# Patient Record
Sex: Female | Born: 1955 | Race: White | Hispanic: No | Marital: Married | Smoking: Former smoker
Health system: Southern US, Community
[De-identification: ages and names within clinical notes are randomized; demographics above are authoritative.]

## PROBLEM LIST (undated history)

## (undated) DIAGNOSIS — J209 Acute bronchitis, unspecified: Secondary | ICD-10-CM

## (undated) DIAGNOSIS — M359 Systemic involvement of connective tissue, unspecified: Secondary | ICD-10-CM

## (undated) DIAGNOSIS — K219 Gastro-esophageal reflux disease without esophagitis: Secondary | ICD-10-CM

## (undated) DIAGNOSIS — C801 Malignant (primary) neoplasm, unspecified: Secondary | ICD-10-CM

## (undated) DIAGNOSIS — J449 Chronic obstructive pulmonary disease, unspecified: Secondary | ICD-10-CM

## (undated) DIAGNOSIS — M797 Fibromyalgia: Secondary | ICD-10-CM

## (undated) DIAGNOSIS — D649 Anemia, unspecified: Secondary | ICD-10-CM

## (undated) DIAGNOSIS — I10 Essential (primary) hypertension: Secondary | ICD-10-CM

## (undated) DIAGNOSIS — R011 Cardiac murmur, unspecified: Secondary | ICD-10-CM

## (undated) DIAGNOSIS — I350 Nonrheumatic aortic (valve) stenosis: Secondary | ICD-10-CM

## (undated) DIAGNOSIS — R079 Chest pain, unspecified: Secondary | ICD-10-CM

## (undated) DIAGNOSIS — I6529 Occlusion and stenosis of unspecified carotid artery: Secondary | ICD-10-CM

## (undated) DIAGNOSIS — Z720 Tobacco use: Secondary | ICD-10-CM

## (undated) HISTORY — DX: Nonrheumatic aortic (valve) stenosis: I35.0

## (undated) HISTORY — DX: Chest pain, unspecified: R07.9

## (undated) HISTORY — PX: ABDOMINAL HYSTERECTOMY: SHX81

## (undated) HISTORY — PX: COLONOSCOPY: SHX174

## (undated) HISTORY — DX: Cardiac murmur, unspecified: R01.1

## (undated) HISTORY — DX: Fibromyalgia: M79.7

## (undated) HISTORY — PX: TONSILLECTOMY: SUR1361

## (undated) HISTORY — DX: Acute bronchitis, unspecified: J20.9

## (undated) HISTORY — DX: Occlusion and stenosis of unspecified carotid artery: I65.29

## (undated) HISTORY — DX: Tobacco use: Z72.0

## (undated) HISTORY — DX: Anemia, unspecified: D64.9

## (undated) HISTORY — PX: UPPER GI ENDOSCOPY: SHX6162

---

## 2011-11-12 DIAGNOSIS — I6529 Occlusion and stenosis of unspecified carotid artery: Secondary | ICD-10-CM

## 2011-11-12 DIAGNOSIS — Z952 Presence of prosthetic heart valve: Secondary | ICD-10-CM | POA: Insufficient documentation

## 2011-11-12 HISTORY — PX: AORTIC VALVE REPLACEMENT: SHX41

## 2011-11-12 HISTORY — DX: Occlusion and stenosis of unspecified carotid artery: I65.29

## 2012-06-24 ENCOUNTER — Ambulatory Visit: Payer: Self-pay | Admitting: Cardiology

## 2012-07-08 ENCOUNTER — Encounter: Payer: Self-pay | Admitting: Cardiology

## 2012-07-27 ENCOUNTER — Ambulatory Visit (INDEPENDENT_AMBULATORY_CARE_PROVIDER_SITE_OTHER): Payer: BC Managed Care – PPO | Admitting: Cardiovascular Disease

## 2012-07-27 ENCOUNTER — Encounter: Payer: Self-pay | Admitting: Cardiovascular Disease

## 2012-07-27 VITALS — BP 141/84 | HR 80 | Ht 63.0 in | Wt 116.0 lb

## 2012-07-27 DIAGNOSIS — Z954 Presence of other heart-valve replacement: Secondary | ICD-10-CM

## 2012-07-27 DIAGNOSIS — F17200 Nicotine dependence, unspecified, uncomplicated: Secondary | ICD-10-CM

## 2012-07-27 DIAGNOSIS — Z720 Tobacco use: Secondary | ICD-10-CM | POA: Insufficient documentation

## 2012-07-27 DIAGNOSIS — Z136 Encounter for screening for cardiovascular disorders: Secondary | ICD-10-CM

## 2012-07-27 DIAGNOSIS — Z952 Presence of prosthetic heart valve: Secondary | ICD-10-CM | POA: Insufficient documentation

## 2012-07-27 DIAGNOSIS — I6529 Occlusion and stenosis of unspecified carotid artery: Secondary | ICD-10-CM | POA: Insufficient documentation

## 2012-07-27 NOTE — Patient Instructions (Addendum)
Follow up in 6 months 

## 2012-07-27 NOTE — Assessment & Plan Note (Signed)
This was mild and bilateral. Continue small dose aspirin daily. I discussed with her the importance of smoking cessation. Recommend a followup carotid duplex ultrasound in 2 years.

## 2012-07-27 NOTE — Assessment & Plan Note (Signed)
I had a prolonged discussion with her about the importance of smoking cessation. She is down to one or 2 cigarettes a day.

## 2012-07-27 NOTE — Assessment & Plan Note (Signed)
She is doing well after an aortic valve replacement with a mechanical valve more than 6 months ago in [place]. Continue anticoagulation with warfarin with a target INR between 2 and 3. I recommend a followup echocardiogram in 6 months from now to have a baseline here. She had no significant coronary artery disease on preoperative cardiac cath.

## 2012-07-27 NOTE — Progress Notes (Signed)
HPI  This is a pleasant 57 year old female who is here today to establish cardiovascular care. She moved recently from [place]. She presented there in May 2013 with unstable angina. Cardiac evaluation reveals critical aortic stenosis with a valve area of 0.42. Cardiac catheterization showed no significant coronary artery disease. Carotid ultrasound showed mild disease bilaterally. She underwent aortic valve replacement with a St. Jude mechanical valve without complications. Postoperative echocardiogram showed normal functioning mechanical valve with a mean/peak gradient of 7/14 mmHg. She has been doing well. She denies any significant dyspnea, orthopnea or PND. She has some chest discomfort at the surgical site after she carried heavy objects during the recent move. She continues to smoke one to 2 packs per day. She gets her warfarin managed at [facility]. She had a recent upper respiratory tract infection which improved.  Allergies  Allergen Reactions  . Codeine     Nausea      Current Outpatient Prescriptions on File Prior to Visit  Medication Sig Dispense Refill  . aspirin 81 MG tablet Take 81 mg by mouth daily.      . ferrous sulfate 325 (65 FE) MG tablet Take 325 mg by mouth daily with breakfast.      . pantoprazole (PROTONIX) 40 MG tablet Take 40 mg by mouth daily.      Marland Kitchen warfarin (COUMADIN) 2.5 MG tablet Take 2.5 mg by mouth daily.         Past Medical History  Diagnosis Date  . Anemia, unspecified   . Acute bronchitis   . Chest pain   . Murmur   . Fibromyalgia   . Tobacco abuse   . Aortic stenosis     previous cardiologisit: Dr. Titus Dubin in [place], [place]  . Carotid stenosis 05/13    mild less than 50% bilaterally     Past Surgical History  Procedure Date  . Aortic valve replacement 11/2011    in [place]. St. Jude 19 mm     Family History  Problem Relation Age of Onset  . Cancer Mother     Died at 75  . Diabetes type II Sister   . Emphysema Father   .  Heart attack Mother   . Heart disease Sister   . Liver disease Father   . Rheumatic fever Sister      History   Social History  . Marital Status: Single    Spouse Name: N/A    Number of Children: N/A  . Years of Education: N/A   Occupational History  . Not on file.   Social History Main Topics  . Smoking status: Current Every Day Smoker -- 0.9 packs/day for 42 years    Types: Cigarettes  . Smokeless tobacco: Not on file  . Alcohol Use: No  . Drug Use: No  . Sexually Active: Not on file   Other Topics Concern  . Not on file   Social History Narrative  . No narrative on file     ROS Constitutional: Negative for fever, chills, diaphoresis, activity change, appetite change and fatigue.  HENT: Negative for hearing loss, nosebleeds, congestion, sore throat, facial swelling, drooling, trouble swallowing, neck pain, voice change, sinus pressure and tinnitus.  Eyes: Negative for photophobia, pain, discharge and visual disturbance.  Respiratory: Negative for apnea, cough, chest tightness, shortness of breath and wheezing.  Cardiovascular: Negative for chest pain, palpitations and leg swelling.  Gastrointestinal: Negative for nausea, vomiting, abdominal pain, diarrhea, constipation, blood in stool and abdominal distention.  Genitourinary: Negative  for dysuria, urgency, frequency, hematuria and decreased urine volume.  Musculoskeletal: Negative for myalgias, back pain, joint swelling, arthralgias and gait problem.  Skin: Negative for color change, pallor, rash and wound.  Neurological: Negative for dizziness, tremors, seizures, syncope, speech difficulty, weakness, light-headedness, numbness and headaches.  Psychiatric/Behavioral: Negative for suicidal ideas, hallucinations, behavioral problems and agitation. The patient is not nervous/anxious.     PHYSICAL EXAM   BP 141/84  Pulse 80  Ht 5\' 3"  (1.6 m)  Wt 116 lb (52.617 kg)  BMI 20.55 kg/m2 Constitutional: She is oriented  to person, place, and time. She appears well-developed and well-nourished. No distress.  HENT: No nasal discharge.  Head: Normocephalic and atraumatic.  Eyes: Pupils are equal and round. Right eye exhibits no discharge. Left eye exhibits no discharge.  Neck: Normal range of motion. Neck supple. No JVD present. No thyromegaly present. No carotid bruit Cardiovascular: Normal rate, regular rhythm, normal mechanical heart sounds. Exam reveals no gallop and no friction rub. No murmur heard.  Pulmonary/Chest: Effort normal and breath sounds normal. No stridor. No respiratory distress. She has no wheezes. She has no rales. She exhibits no tenderness.  Abdominal: Soft. Bowel sounds are normal. She exhibits no distension. There is no tenderness. There is no rebound and no guarding.  Musculoskeletal: Normal range of motion. She exhibits no edema and no tenderness.  Neurological: She is alert and oriented to person, place, and time. Coordination normal.  Skin: Skin is warm and dry. No rash noted. She is not diaphoretic. No erythema. No pallor.  Psychiatric: She has a normal mood and affect. Her behavior is normal. Judgment and thought content normal.     EKG: Sinus  Rhythm  WITHIN NORMAL LIMITS   ASSESSMENT AND PLAN

## 2012-12-01 ENCOUNTER — Ambulatory Visit (INDEPENDENT_AMBULATORY_CARE_PROVIDER_SITE_OTHER): Payer: BC Managed Care – PPO | Admitting: Cardiology

## 2012-12-01 DIAGNOSIS — I359 Nonrheumatic aortic valve disorder, unspecified: Secondary | ICD-10-CM

## 2012-12-09 ENCOUNTER — Encounter: Payer: Self-pay | Admitting: *Deleted

## 2012-12-21 ENCOUNTER — Ambulatory Visit (INDEPENDENT_AMBULATORY_CARE_PROVIDER_SITE_OTHER): Payer: BC Managed Care – PPO | Admitting: *Deleted

## 2012-12-21 DIAGNOSIS — Z952 Presence of prosthetic heart valve: Secondary | ICD-10-CM

## 2012-12-21 DIAGNOSIS — Z7901 Long term (current) use of anticoagulants: Secondary | ICD-10-CM | POA: Insufficient documentation

## 2012-12-21 DIAGNOSIS — Z954 Presence of other heart-valve replacement: Secondary | ICD-10-CM

## 2012-12-21 NOTE — Patient Instructions (Addendum)

## 2013-01-25 ENCOUNTER — Encounter: Payer: Self-pay | Admitting: Cardiovascular Disease

## 2013-01-25 ENCOUNTER — Ambulatory Visit (INDEPENDENT_AMBULATORY_CARE_PROVIDER_SITE_OTHER): Payer: BC Managed Care – PPO | Admitting: Cardiovascular Disease

## 2013-01-25 VITALS — BP 151/80 | HR 101 | Ht 63.0 in | Wt 122.0 lb

## 2013-01-25 DIAGNOSIS — Z952 Presence of prosthetic heart valve: Secondary | ICD-10-CM

## 2013-01-25 DIAGNOSIS — Z720 Tobacco use: Secondary | ICD-10-CM

## 2013-01-25 DIAGNOSIS — Z954 Presence of other heart-valve replacement: Secondary | ICD-10-CM

## 2013-01-25 DIAGNOSIS — I10 Essential (primary) hypertension: Secondary | ICD-10-CM

## 2013-01-25 DIAGNOSIS — F17200 Nicotine dependence, unspecified, uncomplicated: Secondary | ICD-10-CM

## 2013-01-25 DIAGNOSIS — I6529 Occlusion and stenosis of unspecified carotid artery: Secondary | ICD-10-CM

## 2013-01-25 NOTE — Progress Notes (Signed)
Patient ID: Judy Sawyer, female   DOB: 1956/02/04, 57 y.o.   MRN: [id number]    SUBJECTIVE: This is a pleasant 57 year old who is here for a routine f/u visit. She moved here from [place] in November 2013. She presented there in May 2013 with unstable angina. Cardiac evaluation revealed critical aortic stenosis with a valve area of 0.42. Cardiac catheterization showed no significant coronary artery disease. Carotid ultrasound showed mild disease bilaterally. She underwent aortic valve replacement with a St. Jude mechanical valve without complications. Postoperative echocardiogram showed normal functioning mechanical valve with a mean/peak gradient of 7/14 mmHg.  Since that time, she has been doing well. She denies any significant chest pain, dyspnea, orthopnea or PND. She continues to smoke one to 2-3 cigarettes per day, which is less than what she used to smoke. She gets her warfarin managed at [facility].  She and her husband did a 1 mile prayer walk last night. She had some toe and calf cramping, but to a mild degree. When she recently walked 3 miles, she had no cramping.    BP: 151/80 mmHg HR: 101 bpm (96 bpm on recheck)  PHYSICAL EXAM General: NAD Neck: No JVD, no thyromegaly or thyroid nodule.  Lungs: Clear to auscultation bilaterally with normal respiratory effort. CV: Nondisplaced PMI.  Heart regular S1/prosthetic S2, no S3/S4, no murmur.  No peripheral edema.  No carotid bruit.  Normal pedal pulses.  Abdomen: Soft, nontender, no hepatosplenomegaly, no distention.  Neurologic: Alert and oriented x 3.  Psych: Normal affect. Extremities: No clubbing or cyanosis.     LABS: Basic Metabolic Panel: No results found for this basename: NA, K, CL, CO2, GLUCOSE, BUN, CREATININE, CALCIUM, MG, PHOS,  in the last 72 hours Liver Function Tests: No results found for this basename: AST, ALT, ALKPHOS, BILITOT, PROT, ALBUMIN,  in the last 72 hours No results found for this basename: LIPASE,  AMYLASE,  in the last 72 hours CBC: No results found for this basename: WBC, NEUTROABS, HGB, HCT, MCV, PLT,  in the last 72 hours Cardiac Enzymes: No results found for this basename: CKTOTAL, CKMB, CKMBINDEX, TROPONINI,  in the last 72 hours BNP: No components found with this basename: POCBNP,  D-Dimer: No results found for this basename: DDIMER,  in the last 72 hours Hemoglobin A1C: No results found for this basename: HGBA1C,  in the last 72 hours Fasting Lipid Panel: No results found for this basename: CHOL, HDL, LDLCALC, TRIG, CHOLHDL, LDLDIRECT,  in the last 72 hours Thyroid Function Tests: No results found for this basename: TSH, T4TOTAL, FREET3, T3FREE, THYROIDAB,  in the last 72 hours Anemia Panel: No results found for this basename: VITAMINB12, FOLATE, FERRITIN, TIBC, IRON, RETICCTPCT,  in the last 72 hours   Echo (May 2014): - Left ventricle: The cavity size was normal. Wall thickness was normal. Systolic function was normal. The estimated ejection fraction was in the range of 60% to 65%. Wall motion was normal; there were no regional wall motion abnormalities. Doppler parameters are consistent with abnormal left ventricular relaxation (grade 1 diastolic dysfunction). - Aortic valve: A St. Jude Medical mechanical prosthesis was present. Appears well seated. Trivial regurgitation. Somewhat peripherally located, cannot completely exclude paravalvular without old study for comparison. Mean gradient: 7mm Hg (S). Peak gradient: 12mm Hg (S). - Mitral valve: Mild regurgitation. - Right atrium: Central venous pressure: 3mm Hg (est). - Tricuspid valve: Mild regurgitation. - Pulmonary arteries: PA peak pressure: 24mm Hg (S). - Pericardium, extracardiac: There was no pericardial effusion.  ASSESSMENT AND PLAN: 1. S/p AVR: She is doing well after an aortic valve replacement with a mechanical valve in [place]. Continue anticoagulation with warfarin with a target INR between 2  and 3.  She had no significant coronary artery disease on preoperative cardiac cath. 2. Carotid artery stenosis: This was mild and bilateral. Continue small dose aspirin daily. I discussed with her the importance of smoking cessation.  3. Tobacco abuse: cessation counseling given. She has cut down considerably. 4. HTN: usually 127/70 mmHg. Will monitor for now. She has a cuff at home, so I've instructed her to check it 3-4 times a week for the next 2 weeks, and to inform me of those results.   Prentice Docker, M.D., F.A.C.C.

## 2013-01-25 NOTE — Patient Instructions (Addendum)
Continue all current medications. Your physician has requested that you regularly monitor and record your blood pressure readings at home. Please use the same machine at the same time of day to check your readings and record them to bring to your follow-up visit. Check at home 3-4 x week x 2 weeks.  Please bring to office or mail.   Your physician wants you to follow up in: 6 months.  You will receive a reminder letter in the mail one-two months in advance.  If you don't receive a letter, please call our office to schedule the follow up appointment    Blood Pressure Record Sheet Your blood pressure on this visit to the Emergency Department or Clinic is elevated. This does not necessarily mean you have hypertension, but it does mean that your blood pressure needs to be rechecked. Many times blood pressure increases because of illness, pain, anxiety, or other factors. We recommend that you get a series of blood pressure readings done over a period of about 5 days. It is best to get a reading in the morning and one in the evening. You should make sure you sit and relax for 1 to 5 minutes before the reading is taken. Write the readings down and make a follow-up appointment with your doctor to discuss the results. If there is not a free clinic or a drug store with blood pressure taking machines near you, you can purchase good blood pressure taking equipment from a drug store, often for less than the price of a physician's office call. Having one in the home also allows you the convenience of taking your blood pressure while you are home and in relaxed surroundings. Your blood pressure in the Emergency Department or Clinic on ________ was ____________________. BLOOD PRESSURE LOG Date: _______________________  a.m. _____________________  p.m. _____________________ Date: _______________________  a.m. _____________________  p.m. _____________________ Date: _______________________  a.m.  _____________________  p.m. _____________________ Date: _______________________  a.m. _____________________  p.m. _____________________ Date: _______________________  a.m. _____________________  p.m. _____________________ Date: _______________________  a.m. _____________________  p.m. _____________________ Date: _______________________  a.m. _____________________  p.m. _____________________ Date: _______________________  a.m. _____________________  p.m. _____________________ Date: _______________________  a.m. _____________________  p.m. _____________________ Date: _______________________  a.m. _____________________  p.m. _____________________ Date: _______________________  a.m. _____________________  p.m. _____________________ Date: _______________________  a.m. _____________________  p.m. _____________________ Date: _______________________  a.m. _____________________  p.m. _____________________ Date: _______________________  a.m. _____________________  p.m. _____________________ Date: _______________________  a.m. _____________________  p.m. _____________________

## 2013-02-01 ENCOUNTER — Ambulatory Visit: Payer: Self-pay | Admitting: Pharmacist

## 2013-02-01 DIAGNOSIS — Z952 Presence of prosthetic heart valve: Secondary | ICD-10-CM

## 2013-02-01 DIAGNOSIS — Z7901 Long term (current) use of anticoagulants: Secondary | ICD-10-CM

## 2013-02-21 ENCOUNTER — Telehealth: Payer: Self-pay | Admitting: *Deleted

## 2013-02-21 NOTE — Telephone Encounter (Signed)
Patient brought BP log by office:               01/27/13 11:00 am 130/75   10:00 pm 120/76  01/29/13 2:30 pm 125/76  01/31/13 9:00 am 128/80  02/02/13 1:30 pm 123/76  02/03/13 3:20 pm  137/85   7:30 pm  138/80  02/04/13 11:00 am 126/80  02/09/13 11:15 am  128/80  02/10/13 3:00 pm  132/83  02/14/13 11:00 am  130/80

## 2013-02-22 NOTE — Telephone Encounter (Signed)
Patient notified and verbalized understanding. 

## 2013-02-22 NOTE — Telephone Encounter (Signed)
Excellent control. No changes in meds.

## 2013-07-18 ENCOUNTER — Telehealth: Payer: Self-pay | Admitting: Cardiovascular Disease

## 2013-07-18 NOTE — Telephone Encounter (Signed)
Patient has 6 mo follow up scheduled for 08/01/2013 with Dr. Bronson Ing.  Reviewed most recent dictation & echo and notes do not mention when echo should be repeated again.  Please advise.

## 2013-07-18 NOTE — Telephone Encounter (Signed)
Patient will be seeing Dr Bronson Ing this month  She would like to know if she needs an Echo done before visit.  She stated that she normally does one every 6 months

## 2013-07-18 NOTE — Telephone Encounter (Signed)
Can wait to see me at ov and I will determine if a repeat echo is needed at that time.

## 2013-07-19 NOTE — Telephone Encounter (Signed)
Patient notified

## 2013-08-01 ENCOUNTER — Ambulatory Visit (INDEPENDENT_AMBULATORY_CARE_PROVIDER_SITE_OTHER): Payer: BC Managed Care – PPO | Admitting: Cardiovascular Disease

## 2013-08-01 ENCOUNTER — Encounter (INDEPENDENT_AMBULATORY_CARE_PROVIDER_SITE_OTHER): Payer: Self-pay

## 2013-08-01 ENCOUNTER — Encounter: Payer: Self-pay | Admitting: Cardiovascular Disease

## 2013-08-01 VITALS — BP 146/85 | HR 91 | Ht 63.0 in | Wt 120.8 lb

## 2013-08-01 DIAGNOSIS — Z7901 Long term (current) use of anticoagulants: Secondary | ICD-10-CM

## 2013-08-01 DIAGNOSIS — F17200 Nicotine dependence, unspecified, uncomplicated: Secondary | ICD-10-CM

## 2013-08-01 DIAGNOSIS — R252 Cramp and spasm: Secondary | ICD-10-CM

## 2013-08-01 DIAGNOSIS — M79609 Pain in unspecified limb: Secondary | ICD-10-CM

## 2013-08-01 DIAGNOSIS — Z952 Presence of prosthetic heart valve: Secondary | ICD-10-CM

## 2013-08-01 DIAGNOSIS — I10 Essential (primary) hypertension: Secondary | ICD-10-CM

## 2013-08-01 DIAGNOSIS — G8912 Acute post-thoracotomy pain: Secondary | ICD-10-CM

## 2013-08-01 DIAGNOSIS — R0789 Other chest pain: Secondary | ICD-10-CM

## 2013-08-01 DIAGNOSIS — G8918 Other acute postprocedural pain: Secondary | ICD-10-CM

## 2013-08-01 DIAGNOSIS — Z954 Presence of other heart-valve replacement: Secondary | ICD-10-CM

## 2013-08-01 DIAGNOSIS — I6529 Occlusion and stenosis of unspecified carotid artery: Secondary | ICD-10-CM

## 2013-08-01 DIAGNOSIS — Z720 Tobacco use: Secondary | ICD-10-CM

## 2013-08-01 DIAGNOSIS — Z136 Encounter for screening for cardiovascular disorders: Secondary | ICD-10-CM

## 2013-08-01 DIAGNOSIS — E876 Hypokalemia: Secondary | ICD-10-CM

## 2013-08-01 MED ORDER — POTASSIUM CHLORIDE CRYS ER 20 MEQ PO TBCR: 20.0000 meq | EXTENDED_RELEASE_TABLET | Freq: Every day | ORAL | Status: DC

## 2013-08-01 NOTE — Patient Instructions (Signed)
   Lab for BMET, Magnesium - do today Office will contact with results via phone or letter.   Begin KCl 57meq (potassium) daily - please start after receiving lab results Continue all other medications.   Establish with our coumadin clinic Follow up in  4-6 weeks

## 2013-08-01 NOTE — Progress Notes (Signed)
Patient ID: Judy Sawyer, female   DOB: [birth year], 58 y.o.   MRN: [id number]      SUBJECTIVE: The patient is here to followup for routine cardiovascular care after undergoing mechanical aortic valve replacement, hypertension, and mild carotid artery disease. She has been having chest pains underneath the left and right breast as well as in the substernal region. She describes it as a muscle ache. It is exacerbated after chopping wood. She's also been experiencing hand leg and foot cramping. She took some mustard and some molasses and this did help alleviate some of her cramps, and she took some leftover potassium chloride tablets and this alleviated her cramps altogether. She also occasionally feels dizzy if she exerts herself too quickly or moves too fast. Her husband is here as well.    Allergies  Allergen Reactions  . Codeine     Nausea     Current Outpatient Prescriptions  Medication Sig Dispense Refill  . aspirin 81 MG tablet Take 81 mg by mouth daily.      Marland Kitchen docusate sodium (COLACE) 100 MG capsule Take 100 mg by mouth every other day.      . pantoprazole (PROTONIX) 40 MG tablet Take 40 mg by mouth daily.      Marland Kitchen warfarin (COUMADIN) 2.5 MG tablet Take 2.5 mg by mouth daily.       No current facility-administered medications for this visit.    Past Medical History  Diagnosis Date  . Anemia, unspecified   . Acute bronchitis   . Chest pain   . Murmur   . Fibromyalgia   . Tobacco abuse   . Aortic stenosis     previous cardiologisit: Dr. Remigio Eisenmenger in [place], [place]  . Carotid stenosis 05/13    mild less than 50% bilaterally    Past Surgical History  Procedure Laterality Date  . Aortic valve replacement  11/2011    in [place]. St. Jude 19 mm    History   Social History  . Marital Status: Single    Spouse Name: N/A    Number of Children: N/A  . Years of Education: N/A   Occupational History  . Not on file.   Social History Main Topics  . Smoking status:  Current Every Day Smoker -- 0.90 packs/day for 42 years    Types: Cigarettes    Start date: 07/14/1969  . Smokeless tobacco: Never Used  . Alcohol Use: No  . Drug Use: No  . Sexual Activity: Not on file   Other Topics Concern  . Not on file   Social History Narrative  . No narrative on file    BP 146/85 Pulse 91    PHYSICAL EXAM General: NAD Neck: No JVD, no thyromegaly or thyroid nodule.  Lungs: Clear to auscultation bilaterally with normal respiratory effort. CV: Nondisplaced PMI.  Tenderness in bilateral lower intercostal spaces, particularly in inframammary regions. Heart regular U7/O5 click, no D6/U4, I/VI holosystolic murmur along left sternal border.  No peripheral edema.  No carotid bruit.  Normal pedal pulses.  Abdomen: Soft, nontender, no hepatosplenomegaly, no distention.  Neurologic: Alert and oriented x 3.  Psych: Normal affect. Extremities: No clubbing or cyanosis.   ECG: reviewed and available in electronic records.   Echo (12-01-2012): - Left ventricle: The cavity size was normal. Wall thickness was normal. Systolic function was normal. The estimated ejection fraction was in the range of 60% to 65%. Wall motion was normal; there were no regional wall motion abnormalities. Doppler parameters are consistent  with abnormal left ventricular relaxation (grade 1 diastolic dysfunction). - Aortic valve: A St. Jude Medical mechanical prosthesis was present. Appears well seated. Trivial regurgitation. Somewhat peripherally located, cannot completely exclude paravalvular without old study for comparison. Mean gradient: 7mm Hg (S). Peak gradient: 29mm Hg (S). - Mitral valve: Mild regurgitation. - Right atrium: Central venous pressure: 32mm Hg (est). - Tricuspid valve: Mild regurgitation. - Pulmonary arteries: PA peak pressure: 67mm Hg (S). - Pericardium, extracardiac: There was no pericardial effusion.     ASSESSMENT AND PLAN: 1. S/p AVR: She is doing well  after an aortic valve replacement with a mechanical valve in [place]. Continue anticoagulation with warfarin with a target INR between 2 and 3. Will enroll in our anticoagulation clinic as she has not had routine monitoring lately. She had no significant coronary artery disease on preoperative cardiac cath.  2. Carotid artery stenosis: This was mild and bilateral. Continue small dose aspirin daily. I discussed with her the importance of smoking cessation at her last visit.  3. Tobacco abuse: cessation counseling given at last visit. She has cut down considerably.  4. HTN: usually 127/70 mmHg at home and PCP's office. Will continue to monitor for now. 5. Muscle cramping (hands, calves, feet): may be due to hypokalemia, as she apparently had problems with this decades ago. Will check serum K and Mg levels. Will then start KCl 20 meq daily. 6. Chest pains: appear to be musculoskeletal in etiology. I've educated her on exercises to help stretch chest wall muscles to help attenuate further episodes. Likely due to postoperative changes and subsequent scar tissue formation.  Dispo: f/u 6 weeks.  Kate Sable, M.D., F.A.C.C.

## 2013-08-02 ENCOUNTER — Ambulatory Visit (INDEPENDENT_AMBULATORY_CARE_PROVIDER_SITE_OTHER): Payer: BC Managed Care – PPO | Admitting: *Deleted

## 2013-08-02 DIAGNOSIS — Z954 Presence of other heart-valve replacement: Secondary | ICD-10-CM

## 2013-08-02 DIAGNOSIS — Z952 Presence of prosthetic heart valve: Secondary | ICD-10-CM

## 2013-08-02 DIAGNOSIS — Z7901 Long term (current) use of anticoagulants: Secondary | ICD-10-CM

## 2013-08-02 LAB — POCT INR
INR: 4.9
INR: 4.9

## 2013-08-04 ENCOUNTER — Encounter: Payer: Self-pay | Admitting: *Deleted

## 2013-08-08 ENCOUNTER — Telehealth: Payer: Self-pay | Admitting: *Deleted

## 2013-08-08 NOTE — Telephone Encounter (Signed)
Message copied by Laurine Blazer on Mon Aug 08, 2013 11:59 AM ------      Message from: Laurine Blazer      Created: Mon Aug 01, 2013  1:48 PM      Regarding: ko fu        BMET, MG - TODAY             CALL PT SO SHE CAN START HER POTASSIUM  ------

## 2013-08-08 NOTE — Telephone Encounter (Signed)
Potassium was 4.1.  Was given rx for Potassium 29meq daily at last OV on 1/19 to be started after receiving lab results.  Did you still want her to begin the potassium given her labs were normal?

## 2013-08-08 NOTE — Telephone Encounter (Signed)
No need for KCl. Thanks.

## 2013-08-09 NOTE — Telephone Encounter (Signed)
Patient notified and verbalized understanding.  Has follow up OV scheduled for 09/12/2013 with Dr. Bronson Ing.

## 2013-08-16 ENCOUNTER — Ambulatory Visit (INDEPENDENT_AMBULATORY_CARE_PROVIDER_SITE_OTHER): Payer: BC Managed Care – PPO | Admitting: *Deleted

## 2013-08-16 DIAGNOSIS — Z954 Presence of other heart-valve replacement: Secondary | ICD-10-CM

## 2013-08-16 DIAGNOSIS — Z5181 Encounter for therapeutic drug level monitoring: Secondary | ICD-10-CM

## 2013-08-16 DIAGNOSIS — Z952 Presence of prosthetic heart valve: Secondary | ICD-10-CM

## 2013-08-16 DIAGNOSIS — Z7901 Long term (current) use of anticoagulants: Secondary | ICD-10-CM

## 2013-08-16 LAB — POCT INR: INR: 3.8

## 2013-09-02 ENCOUNTER — Ambulatory Visit (INDEPENDENT_AMBULATORY_CARE_PROVIDER_SITE_OTHER): Payer: BC Managed Care – PPO | Admitting: *Deleted

## 2013-09-02 DIAGNOSIS — Z5181 Encounter for therapeutic drug level monitoring: Secondary | ICD-10-CM

## 2013-09-02 DIAGNOSIS — Z952 Presence of prosthetic heart valve: Secondary | ICD-10-CM

## 2013-09-02 DIAGNOSIS — Z7901 Long term (current) use of anticoagulants: Secondary | ICD-10-CM

## 2013-09-02 DIAGNOSIS — Z954 Presence of other heart-valve replacement: Secondary | ICD-10-CM

## 2013-09-02 LAB — POCT INR: INR: 2.8

## 2013-09-12 ENCOUNTER — Encounter: Payer: Self-pay | Admitting: Cardiovascular Disease

## 2013-09-12 ENCOUNTER — Ambulatory Visit (INDEPENDENT_AMBULATORY_CARE_PROVIDER_SITE_OTHER): Payer: BC Managed Care – PPO | Admitting: Cardiovascular Disease

## 2013-09-12 VITALS — BP 112/71 | HR 71 | Ht 63.0 in | Wt 124.0 lb

## 2013-09-12 DIAGNOSIS — Z952 Presence of prosthetic heart valve: Secondary | ICD-10-CM

## 2013-09-12 DIAGNOSIS — I6529 Occlusion and stenosis of unspecified carotid artery: Secondary | ICD-10-CM

## 2013-09-12 DIAGNOSIS — R0789 Other chest pain: Principal | ICD-10-CM

## 2013-09-12 DIAGNOSIS — G8912 Acute post-thoracotomy pain: Secondary | ICD-10-CM

## 2013-09-12 DIAGNOSIS — Z720 Tobacco use: Secondary | ICD-10-CM

## 2013-09-12 DIAGNOSIS — F17200 Nicotine dependence, unspecified, uncomplicated: Secondary | ICD-10-CM

## 2013-09-12 DIAGNOSIS — Z954 Presence of other heart-valve replacement: Secondary | ICD-10-CM

## 2013-09-12 DIAGNOSIS — I10 Essential (primary) hypertension: Secondary | ICD-10-CM

## 2013-09-12 DIAGNOSIS — G8918 Other acute postprocedural pain: Secondary | ICD-10-CM

## 2013-09-12 DIAGNOSIS — Z7901 Long term (current) use of anticoagulants: Secondary | ICD-10-CM

## 2013-09-12 DIAGNOSIS — R252 Cramp and spasm: Secondary | ICD-10-CM | POA: Insufficient documentation

## 2013-09-12 NOTE — Patient Instructions (Signed)
Continue all current medications. Your physician wants you to follow up in: 6 months.  You will receive a reminder letter in the mail one-two months in advance.  If you don't receive a letter, please call our office to schedule the follow up appointment   

## 2013-09-12 NOTE — Progress Notes (Signed)
Patient ID: Judy Sawyer, female   DOB: 1956-04-28, 58 y.o.   MRN: [id number]      SUBJECTIVE: The patient is a 58 year old woman who is here for routine cardiovascular followup. I saw her approximately 6 weeks ago and she been experiencing hand, leg, and foot cramping, as well as chest pains in the bilateral inframammary regions. Her potassium was 4.1 and magnesium was 2.1. She has been doing very well and is no longer had any hand cramps. She has been doing the stretching exercises for her calves and feet and this is help to alleviate most of her cramps. She occasionally gets toe cramping in her bilateral feet primarily at night. Since doing the stretching exercises for her chest wall, her chest pain has subsided to a large degree. She is feeling quite well.    Allergies  Allergen Reactions  . Codeine     Nausea     Current Outpatient Prescriptions  Medication Sig Dispense Refill  . aspirin 81 MG tablet Take 81 mg by mouth daily.      . pantoprazole (PROTONIX) 40 MG tablet Take 40 mg by mouth daily.      Marland Kitchen warfarin (COUMADIN) 2.5 MG tablet Take 2.5 mg by mouth daily.       No current facility-administered medications for this visit.    Past Medical History  Diagnosis Date  . Anemia, unspecified   . Acute bronchitis   . Chest pain   . Murmur   . Fibromyalgia   . Tobacco abuse   . Aortic stenosis     previous cardiologisit: Dr. Remigio Eisenmenger in [place], [place]  . Carotid stenosis 05/13    mild less than 50% bilaterally    Past Surgical History  Procedure Laterality Date  . Aortic valve replacement  11/2011    in [place]. St. Jude 19 mm    History   Social History  . Marital Status: Single    Spouse Name: N/A    Number of Children: N/A  . Years of Education: N/A   Occupational History  . Not on file.   Social History Main Topics  . Smoking status: Current Every Day Smoker -- 0.90 packs/day for 42 years    Types: Cigarettes    Start date: 07/14/1969  .  Smokeless tobacco: Never Used  . Alcohol Use: No  . Drug Use: No  . Sexual Activity: Not on file   Other Topics Concern  . Not on file   Social History Narrative  . No narrative on file     Filed Vitals:   09/12/13 1053  BP: 112/71  Pulse: 71  Height: 5\' 3"  (1.6 m)  Weight: 124 lb (56.246 kg)  SpO2: 98%    PHYSICAL EXAM General: NAD  Neck: No JVD, no thyromegaly or thyroid nodule.  Lungs: Clear to auscultation bilaterally with normal respiratory effort.  CV: Nondisplaced PMI. Tenderness in bilateral lower intercostal spaces, particularly in inframammary regions. Heart regular W0/J8 click, no J1/B1, I/VI holosystolic murmur along left sternal border. No peripheral edema. No carotid bruit. Normal pedal pulses.  Abdomen: Soft, nontender, no hepatosplenomegaly, no distention.  Neurologic: Alert and oriented x 3.  Psych: Normal affect.  Extremities: No clubbing or cyanosis.    ECG: reviewed and available in electronic records.      ASSESSMENT AND PLAN: 1. S/p AVR: She is doing well after an aortic valve replacement with a mechanical valve in [place]. Continue anticoagulation with warfarin with a target INR between 2 and 3. She  had no significant coronary artery disease on preoperative cardiac cath. I previously enrolled her in our anticoagulation clinic. 2. Carotid artery stenosis: This was mild and bilateral. Continue small dose aspirin daily. I discussed with her the importance of smoking cessation at her last visit.  3. Tobacco abuse: cessation counseling given at last visit. She has cut down considerably.  4. HTN: controlled on present therapy. 5. Muscle cramping (hands, calves, feet): resolved with stretching exercises. 6. Chest pains: appear to be musculoskeletal in etiology, and have resolved for the most part with stretching exercises. Likely due to postoperative changes and subsequent scar tissue formation.   Dispo: f/u 6 months.    Kate Sable, M.D.,  F.A.C.C.

## 2013-09-23 ENCOUNTER — Ambulatory Visit (INDEPENDENT_AMBULATORY_CARE_PROVIDER_SITE_OTHER): Payer: BC Managed Care – PPO | Admitting: *Deleted

## 2013-09-23 DIAGNOSIS — Z952 Presence of prosthetic heart valve: Secondary | ICD-10-CM

## 2013-09-23 DIAGNOSIS — Z5181 Encounter for therapeutic drug level monitoring: Secondary | ICD-10-CM

## 2013-09-23 DIAGNOSIS — Z954 Presence of other heart-valve replacement: Secondary | ICD-10-CM

## 2013-09-23 DIAGNOSIS — Z7901 Long term (current) use of anticoagulants: Secondary | ICD-10-CM

## 2013-09-23 LAB — POCT INR: INR: 3

## 2013-10-21 ENCOUNTER — Ambulatory Visit (INDEPENDENT_AMBULATORY_CARE_PROVIDER_SITE_OTHER): Payer: BC Managed Care – PPO | Admitting: *Deleted

## 2013-10-21 DIAGNOSIS — Z5181 Encounter for therapeutic drug level monitoring: Secondary | ICD-10-CM

## 2013-10-21 DIAGNOSIS — Z952 Presence of prosthetic heart valve: Secondary | ICD-10-CM

## 2013-10-21 DIAGNOSIS — Z954 Presence of other heart-valve replacement: Secondary | ICD-10-CM

## 2013-10-21 DIAGNOSIS — Z7901 Long term (current) use of anticoagulants: Secondary | ICD-10-CM

## 2013-10-21 LAB — POCT INR: INR: 2.6

## 2013-11-18 ENCOUNTER — Ambulatory Visit (INDEPENDENT_AMBULATORY_CARE_PROVIDER_SITE_OTHER): Payer: BC Managed Care – PPO | Admitting: *Deleted

## 2013-11-18 DIAGNOSIS — Z7901 Long term (current) use of anticoagulants: Secondary | ICD-10-CM

## 2013-11-18 DIAGNOSIS — Z952 Presence of prosthetic heart valve: Secondary | ICD-10-CM

## 2013-11-18 DIAGNOSIS — Z5181 Encounter for therapeutic drug level monitoring: Secondary | ICD-10-CM

## 2013-11-18 DIAGNOSIS — Z954 Presence of other heart-valve replacement: Secondary | ICD-10-CM

## 2013-11-18 LAB — POCT INR: INR: 2.6

## 2013-12-30 ENCOUNTER — Ambulatory Visit (INDEPENDENT_AMBULATORY_CARE_PROVIDER_SITE_OTHER): Payer: BC Managed Care – PPO | Admitting: *Deleted

## 2013-12-30 DIAGNOSIS — Z952 Presence of prosthetic heart valve: Secondary | ICD-10-CM

## 2013-12-30 DIAGNOSIS — Z954 Presence of other heart-valve replacement: Secondary | ICD-10-CM

## 2013-12-30 DIAGNOSIS — Z7901 Long term (current) use of anticoagulants: Secondary | ICD-10-CM

## 2013-12-30 DIAGNOSIS — Z5181 Encounter for therapeutic drug level monitoring: Secondary | ICD-10-CM

## 2013-12-30 LAB — POCT INR: INR: 3.6

## 2014-01-25 ENCOUNTER — Encounter: Payer: Self-pay | Admitting: Cardiovascular Disease

## 2014-01-27 ENCOUNTER — Ambulatory Visit (INDEPENDENT_AMBULATORY_CARE_PROVIDER_SITE_OTHER): Payer: BC Managed Care – PPO | Admitting: *Deleted

## 2014-01-27 DIAGNOSIS — Z5181 Encounter for therapeutic drug level monitoring: Secondary | ICD-10-CM

## 2014-01-27 DIAGNOSIS — Z954 Presence of other heart-valve replacement: Secondary | ICD-10-CM

## 2014-01-27 DIAGNOSIS — Z7901 Long term (current) use of anticoagulants: Secondary | ICD-10-CM

## 2014-01-27 DIAGNOSIS — Z952 Presence of prosthetic heart valve: Secondary | ICD-10-CM

## 2014-01-27 LAB — POCT INR: INR: 2.6

## 2014-02-24 ENCOUNTER — Ambulatory Visit (INDEPENDENT_AMBULATORY_CARE_PROVIDER_SITE_OTHER): Payer: BC Managed Care – PPO | Admitting: *Deleted

## 2014-02-24 DIAGNOSIS — Z5181 Encounter for therapeutic drug level monitoring: Secondary | ICD-10-CM

## 2014-02-24 DIAGNOSIS — Z952 Presence of prosthetic heart valve: Secondary | ICD-10-CM

## 2014-02-24 DIAGNOSIS — Z7901 Long term (current) use of anticoagulants: Secondary | ICD-10-CM

## 2014-02-24 DIAGNOSIS — Z954 Presence of other heart-valve replacement: Secondary | ICD-10-CM

## 2014-02-24 LAB — POCT INR: INR: 2.9

## 2014-03-16 ENCOUNTER — Encounter: Payer: Self-pay | Admitting: Cardiovascular Disease

## 2014-03-16 ENCOUNTER — Ambulatory Visit (INDEPENDENT_AMBULATORY_CARE_PROVIDER_SITE_OTHER): Payer: BC Managed Care – PPO | Admitting: Cardiovascular Disease

## 2014-03-16 VITALS — BP 129/78 | HR 101 | Ht 63.0 in | Wt 121.0 lb

## 2014-03-16 DIAGNOSIS — Z7901 Long term (current) use of anticoagulants: Secondary | ICD-10-CM

## 2014-03-16 DIAGNOSIS — G8918 Other acute postprocedural pain: Secondary | ICD-10-CM

## 2014-03-16 DIAGNOSIS — R5383 Other fatigue: Secondary | ICD-10-CM

## 2014-03-16 DIAGNOSIS — F17200 Nicotine dependence, unspecified, uncomplicated: Secondary | ICD-10-CM

## 2014-03-16 DIAGNOSIS — E611 Iron deficiency: Secondary | ICD-10-CM

## 2014-03-16 DIAGNOSIS — D509 Iron deficiency anemia, unspecified: Secondary | ICD-10-CM

## 2014-03-16 DIAGNOSIS — Z954 Presence of other heart-valve replacement: Secondary | ICD-10-CM

## 2014-03-16 DIAGNOSIS — Z720 Tobacco use: Secondary | ICD-10-CM

## 2014-03-16 DIAGNOSIS — D649 Anemia, unspecified: Secondary | ICD-10-CM

## 2014-03-16 DIAGNOSIS — R0789 Other chest pain: Secondary | ICD-10-CM

## 2014-03-16 DIAGNOSIS — G8912 Acute post-thoracotomy pain: Secondary | ICD-10-CM

## 2014-03-16 DIAGNOSIS — Z952 Presence of prosthetic heart valve: Secondary | ICD-10-CM

## 2014-03-16 DIAGNOSIS — R5381 Other malaise: Secondary | ICD-10-CM

## 2014-03-16 DIAGNOSIS — I10 Essential (primary) hypertension: Secondary | ICD-10-CM

## 2014-03-16 DIAGNOSIS — I6529 Occlusion and stenosis of unspecified carotid artery: Secondary | ICD-10-CM

## 2014-03-16 NOTE — Progress Notes (Signed)
Patient ID: Judy Sawyer, female   DOB: 04-Apr-1956, 58 y.o.   MRN: [id number]      SUBJECTIVE: The patient returns for routine cardiovascular followup. She denies chest pain and shortness of breath as well as leg swelling. She occasionally feels fatigued. She denies any bleeding problems. She denies fevers and chills. She believe she has a swollen gland near her right ear and does have right ear abnormalities and follows with ENT. She used to be on supplemental iron therapy and she has been craving ice lately.   Review of Systems: As per "subjective", otherwise negative.  Allergies  Allergen Reactions  . Codeine     Nausea     Current Outpatient Prescriptions  Medication Sig Dispense Refill  . aspirin 81 MG tablet Take 81 mg by mouth daily.      . pantoprazole (PROTONIX) 40 MG tablet Take 40 mg by mouth daily.      Marland Kitchen warfarin (COUMADIN) 2.5 MG tablet Take 2.5 mg by mouth daily.       No current facility-administered medications for this visit.    Past Medical History  Diagnosis Date  . Anemia, unspecified   . Acute bronchitis   . Chest pain   . Murmur   . Fibromyalgia   . Tobacco abuse   . Aortic stenosis     previous cardiologisit: Dr. Remigio Eisenmenger in [place], [place]  . Carotid stenosis 05/13    mild less than 50% bilaterally    Past Surgical History  Procedure Laterality Date  . Aortic valve replacement  11/2011    in [place]. St. Jude 19 mm    History   Social History  . Marital Status: Single    Spouse Name: N/A    Number of Children: N/A  . Years of Education: N/A   Occupational History  . Not on file.   Social History Main Topics  . Smoking status: Current Every Day Smoker -- 0.50 packs/day for 42 years    Types: Cigarettes    Start date: 07/14/1969  . Smokeless tobacco: Never Used  . Alcohol Use: No  . Drug Use: No  . Sexual Activity: Not on file   Other Topics Concern  . Not on file   Social History Narrative  . No narrative on file      Filed Vitals:   03/16/14 0959  BP: 129/78  Pulse: 101  Height: 5\' 3"  (1.6 m)  Weight: 121 lb (54.885 kg)  SpO2: 98%    PHYSICAL EXAM General: NAD  Neck: No JVD, no thyromegaly or thyroid nodule.  Lungs: Faint inspiratory wheezes, but otherwise clear to auscultation bilaterally with normal respiratory effort.  CV: Nondisplaced PMI. Heart regular H7/W2 click, no O3/Z8, I/VI holosystolic murmur along left sternal border. No peripheral edema. No carotid bruit. Normal pedal pulses.  Abdomen: Soft, nontender, no hepatosplenomegaly, no distention.  Neurologic: Alert and oriented x 3.  Psych: Normal affect.  Extremities: No clubbing or cyanosis.    ECG: Most recent ECG reviewed.      ASSESSMENT AND PLAN: 1. S/p AVR: She continues to do well after an aortic valve replacement with a mechanical valve in [place]. Continue anticoagulation with warfarin. She had no significant coronary artery disease on preoperative cardiac cath. She is enrolled in our anticoagulation clinic.  2. Carotid artery stenosis: This was mild and bilateral. Continue small dose aspirin daily. I discussed with her the importance of smoking cessation at a previous visit.  3. Tobacco abuse: Cessation counseling given at a  prior visit.   4. Essential HTN: Controlled on present therapy.  5. Anemia with HR at upper limits of normal and fatigue, with ice craving: She has a known h/o iron deficiency anemia. I will check a CBC and iron studies to see if supplemental therapy is currently warranted. I asked her to f/u with her PCP regarding this as well.  Dispo: f/u 1 year.  Kate Sable, M.D., F.A.C.C.

## 2014-03-16 NOTE — Patient Instructions (Signed)
   Labs for CBC, iron panel Office will contact with results via phone or letter.   Continue all current medications. Your physician wants you to follow up in:  1 year.  You will receive a reminder letter in the mail one-two months in advance.  If you don't receive a letter, please call our office to schedule the follow up appointment

## 2014-03-22 ENCOUNTER — Telehealth: Payer: Self-pay | Admitting: *Deleted

## 2014-03-22 NOTE — Telephone Encounter (Signed)
Message copied by Orion Modest on Wed Mar 22, 2014  4:41 PM ------      Message from: Kate Sable A      Created: Wed Mar 22, 2014  1:01 PM       Not anemic. F/u with PCP. ------

## 2014-03-22 NOTE — Telephone Encounter (Signed)
Pt notified of results. Pt will f/u with PCP

## 2014-03-24 ENCOUNTER — Ambulatory Visit (INDEPENDENT_AMBULATORY_CARE_PROVIDER_SITE_OTHER): Payer: BC Managed Care – PPO | Admitting: *Deleted

## 2014-03-24 DIAGNOSIS — Z952 Presence of prosthetic heart valve: Secondary | ICD-10-CM

## 2014-03-24 DIAGNOSIS — Z7901 Long term (current) use of anticoagulants: Secondary | ICD-10-CM

## 2014-03-24 DIAGNOSIS — Z5181 Encounter for therapeutic drug level monitoring: Secondary | ICD-10-CM

## 2014-03-24 DIAGNOSIS — Z954 Presence of other heart-valve replacement: Secondary | ICD-10-CM

## 2014-03-24 LAB — POCT INR: INR: 2.6

## 2014-05-02 ENCOUNTER — Ambulatory Visit (INDEPENDENT_AMBULATORY_CARE_PROVIDER_SITE_OTHER): Payer: BC Managed Care – PPO | Admitting: *Deleted

## 2014-05-02 DIAGNOSIS — Z7901 Long term (current) use of anticoagulants: Secondary | ICD-10-CM

## 2014-05-02 DIAGNOSIS — Z952 Presence of prosthetic heart valve: Secondary | ICD-10-CM

## 2014-05-02 DIAGNOSIS — Z954 Presence of other heart-valve replacement: Secondary | ICD-10-CM

## 2014-05-02 DIAGNOSIS — Z5181 Encounter for therapeutic drug level monitoring: Secondary | ICD-10-CM

## 2014-05-02 LAB — POCT INR: INR: 3.3

## 2014-05-30 ENCOUNTER — Ambulatory Visit (INDEPENDENT_AMBULATORY_CARE_PROVIDER_SITE_OTHER): Payer: BC Managed Care – PPO | Admitting: *Deleted

## 2014-05-30 DIAGNOSIS — Z952 Presence of prosthetic heart valve: Secondary | ICD-10-CM

## 2014-05-30 DIAGNOSIS — Z5181 Encounter for therapeutic drug level monitoring: Secondary | ICD-10-CM

## 2014-05-30 DIAGNOSIS — Z954 Presence of other heart-valve replacement: Secondary | ICD-10-CM

## 2014-05-30 DIAGNOSIS — Z7901 Long term (current) use of anticoagulants: Secondary | ICD-10-CM

## 2014-05-30 LAB — POCT INR: INR: 4.9

## 2014-06-13 ENCOUNTER — Ambulatory Visit (INDEPENDENT_AMBULATORY_CARE_PROVIDER_SITE_OTHER): Payer: BC Managed Care – PPO | Admitting: *Deleted

## 2014-06-13 DIAGNOSIS — Z7901 Long term (current) use of anticoagulants: Secondary | ICD-10-CM

## 2014-06-13 DIAGNOSIS — Z954 Presence of other heart-valve replacement: Secondary | ICD-10-CM

## 2014-06-13 DIAGNOSIS — Z952 Presence of prosthetic heart valve: Secondary | ICD-10-CM

## 2014-06-13 DIAGNOSIS — Z5181 Encounter for therapeutic drug level monitoring: Secondary | ICD-10-CM

## 2014-06-13 LAB — POCT INR: INR: 2.9

## 2014-07-04 ENCOUNTER — Telehealth: Payer: Self-pay | Admitting: *Deleted

## 2014-07-04 NOTE — Telephone Encounter (Signed)
Patient started taking 6 day pack of prednisone 10 mg and celebrex 07/04/14

## 2014-07-05 NOTE — Telephone Encounter (Signed)
Is starting prednisone 10mg  tablet (6,5,4,3,2,1) today for knee pain, then will start celebrex 200mg  daily after she finishes prednisone.  Told pt to decrease coumadin to 1/2 tablet daily till Sunday 12/27 then resume regular dose. She has f/u INR appt on 12/29.  Pt verbalized understanding.

## 2014-07-11 ENCOUNTER — Ambulatory Visit (INDEPENDENT_AMBULATORY_CARE_PROVIDER_SITE_OTHER): Payer: BC Managed Care – PPO | Admitting: *Deleted

## 2014-07-11 DIAGNOSIS — Z952 Presence of prosthetic heart valve: Secondary | ICD-10-CM

## 2014-07-11 DIAGNOSIS — Z5181 Encounter for therapeutic drug level monitoring: Secondary | ICD-10-CM

## 2014-07-11 DIAGNOSIS — Z954 Presence of other heart-valve replacement: Secondary | ICD-10-CM

## 2014-07-11 DIAGNOSIS — Z7901 Long term (current) use of anticoagulants: Secondary | ICD-10-CM

## 2014-07-11 LAB — POCT INR: INR: 1.5

## 2014-07-13 ENCOUNTER — Telehealth: Payer: Self-pay | Admitting: *Deleted

## 2014-07-13 NOTE — Telephone Encounter (Signed)
Pt needs to have arthroscopy of knee scheduled in the future.  She has been cleared by Dr Bronson Ing.  She has a [employer] Aortic Valve without H/O atrial fib or CVA.  She does not need Lovenox bridge.  Pt told to hold coumadin 5 days prior to procedure and restart coumadin night of procedure.  She will take coumadin 2.5mg  x 4 days then resume 2.5mg  daily except 1.25mg  on M,W,F.  She verbalized understanding.

## 2014-07-27 ENCOUNTER — Ambulatory Visit (INDEPENDENT_AMBULATORY_CARE_PROVIDER_SITE_OTHER): Payer: 59 | Admitting: *Deleted

## 2014-07-27 DIAGNOSIS — Z954 Presence of other heart-valve replacement: Secondary | ICD-10-CM

## 2014-07-27 DIAGNOSIS — Z952 Presence of prosthetic heart valve: Secondary | ICD-10-CM

## 2014-07-27 DIAGNOSIS — Z7901 Long term (current) use of anticoagulants: Secondary | ICD-10-CM

## 2014-07-27 DIAGNOSIS — Z5181 Encounter for therapeutic drug level monitoring: Secondary | ICD-10-CM

## 2014-07-27 LAB — POCT INR: INR: 3.8

## 2014-07-28 ENCOUNTER — Telehealth: Payer: Self-pay | Admitting: *Deleted

## 2014-07-28 NOTE — Telephone Encounter (Signed)
Patient stated that she was told to call back when she had her knee surgery scheduled.  Scheduled for 08/04/14 & will stop her coumadin on 07/30/2014.

## 2014-07-31 ENCOUNTER — Encounter (HOSPITAL_BASED_OUTPATIENT_CLINIC_OR_DEPARTMENT_OTHER): Payer: Self-pay | Admitting: *Deleted

## 2014-07-31 NOTE — Telephone Encounter (Signed)
Patient just wanted  To make sure that you were aware that she is scheduled for surgery 08/04/14

## 2014-08-02 NOTE — Telephone Encounter (Signed)
Called and talked with pt.  She is prepared for surgery on 1/22 unless it is cancelled due to snow.  She will let me know tomorrow if she hears anything different.

## 2014-08-03 ENCOUNTER — Telehealth: Payer: Self-pay | Admitting: *Deleted

## 2014-08-03 NOTE — Telephone Encounter (Signed)
Pt was scheduled for knee surgery on 08/04/14 & stopped her coumadin on 07/30/2014. [facility] called her today to cancel her surgery due to pending inclimate weather tomorrow.  She is rescheduling her surgery for 08/16/14.  She will restart her coumadin tonight as previously instructed and take last dose on 1/28 for pending surgery on 08/16/14.  F/U INR appt made for 08/24/14.  She verbalized understanding.

## 2014-08-04 ENCOUNTER — Ambulatory Visit (HOSPITAL_BASED_OUTPATIENT_CLINIC_OR_DEPARTMENT_OTHER): Admission: RE | Admit: 2014-08-04 | Payer: 59 | Source: Ambulatory Visit | Admitting: Orthopedic Surgery

## 2014-08-04 HISTORY — DX: Chronic obstructive pulmonary disease, unspecified: J44.9

## 2014-08-04 HISTORY — DX: Gastro-esophageal reflux disease without esophagitis: K21.9

## 2014-08-04 SURGERY — ARTHROSCOPY, KNEE, WITH LATERAL MENISCECTOMY
Anesthesia: Choice | Site: Knee | Laterality: Left

## 2014-08-11 ENCOUNTER — Encounter (HOSPITAL_BASED_OUTPATIENT_CLINIC_OR_DEPARTMENT_OTHER): Payer: Self-pay | Admitting: *Deleted

## 2014-08-11 NOTE — Progress Notes (Signed)
To come in for pt ptt-does not need ekg-bmet per dr singer-not diabetic and not on cardiac meds-she did have valve replacement on coumadin

## 2014-08-14 DIAGNOSIS — S83282A Other tear of lateral meniscus, current injury, left knee, initial encounter: Secondary | ICD-10-CM | POA: Diagnosis present

## 2014-08-14 NOTE — H&P (Signed)
Judy Sawyer is an 59 y.o. female.   Chief Complaint: left knee lateral meniscus tear HPI: Judy Sawyer is a 59 year old seen for follow-up from her significant persistent left knee pain with acute non-traumatic lateral meniscus tear. She had a CT scan of her knee because she has a mechanical heart valve and at the time she did not feel she could have an MRI, however she has found out that she can but despite this the CT scan showed chondrocalcinosis with possible meniscal tear. She has severe exquisite lateral knee pain and swelling. We injected her knee previously which helped for 2-3 weeks and then pain recurred. She was given a Sterapred Dosepak a week ago which only helped while she was on it. She has pain on a daily basis. Pain with weightbearing and activity relieved by rest. She is followed by Dr. Ivonne Andrew of cardiology and Denny Levy, [place]. She is on Coumadin due to her mechanical heart valve.  Past Medical History  Diagnosis Date  . Anemia, unspecified   . Acute bronchitis   . Chest pain   . Murmur   . Fibromyalgia   . Tobacco abuse   . Aortic stenosis     previous cardiologisit: Dr. Remigio Eisenmenger in [place], [place]  . Carotid stenosis 05/13    mild less than 50% bilaterally  . COPD (chronic obstructive pulmonary disease)   . GERD (gastroesophageal reflux disease)     Past Surgical History  Procedure Laterality Date  . Aortic valve replacement  11/2011    in [place]. St. Jude 19 mm  . Tonsillectomy    . Abdominal hysterectomy    . Colonoscopy    . Upper gi endoscopy      Family History  Problem Relation Age of Onset  . Cancer Mother     Died at 20  . Diabetes type II Sister   . Emphysema Father   . Heart attack Mother   . Heart disease Sister   . Liver disease Father   . Rheumatic fever Sister    Social History:  reports that she has been smoking Cigarettes.  She started smoking about 45 years ago. She has a 21 pack-year smoking history. She has never used  smokeless tobacco. She reports that she does not drink alcohol or use illicit drugs.  Allergies:  Allergies  Allergen Reactions  . Codeine     Nausea     No prescriptions prior to admission    No results found for this or any previous visit (from the past 48 hour(s)). No results found.  Review of Systems  Constitutional: Negative.   HENT: Negative.   Eyes: Negative.   Respiratory: Negative.   Cardiovascular: Negative.   Gastrointestinal: Negative.   Genitourinary: Negative.   Musculoskeletal:       Left knee pain  Skin: Negative.   Neurological: Negative.   Endo/Heme/Allergies: Negative.   Psychiatric/Behavioral: Negative.     Height 5\' 3"  (1.6 m), weight 54.432 kg (120 lb). Physical Exam  Constitutional: She is oriented to person, place, and time. She appears well-developed and well-nourished.  HENT:  Head: Normocephalic and atraumatic.  Eyes: Conjunctivae and EOM are normal. Pupils are equal, round, and reactive to light.  Neck: Neck supple.  Cardiovascular: Normal rate.   Respiratory: Effort normal.  GI: Soft.  Genitourinary:  Not pertinent to current symptomatology therefore not examined.  Musculoskeletal:  Examination of her left knee reveals pain on the lateral joint line she has a mild palpable amount  of lateral joint swelling consistent with a possible paramensical cyst. 1+ effusion positive lateral McMurray's range of motion 0-120 degrees knee is stable with posterior and lateral knee pain. Exam of the right knee reveals full range of motion without pain swelling weakness or instability. Vascular exam: pulses 2+ and symmetric.   Neurological: She is alert and oriented to person, place, and time.  Skin: Skin is dry.  Psychiatric: She has a normal mood and affect. Her behavior is normal.     Assessment Principal Problem:   Acute lateral meniscus tear of left knee Active Problems:   Tobacco abuse   Carotid stenosis   S/P AVR (aortic valve replacement)    Long term current use of anticoagulant therapy  Plan I talk to her about this in detail. Would recommend with her significant pain lack of response to conservative care that we proceed with left knee arthroscopy with aspiration and injection of the lateral meniscal cyst. Also attention to all meniscal and chondral pathology. Discussed risks benefits and possible complications of the surgery in detail and she understands this completely.   Ihor Meinzer J 08/14/2014, 4:47 PM

## 2014-08-15 ENCOUNTER — Encounter (HOSPITAL_BASED_OUTPATIENT_CLINIC_OR_DEPARTMENT_OTHER)
Admission: RE | Admit: 2014-08-15 | Discharge: 2014-08-15 | Disposition: A | Discharge status: Home / Self Care | Payer: 59 | Source: Ambulatory Visit | Attending: Orthopedic Surgery | Admitting: Orthopedic Surgery

## 2014-08-15 DIAGNOSIS — J449 Chronic obstructive pulmonary disease, unspecified: Secondary | ICD-10-CM | POA: Diagnosis not present

## 2014-08-15 DIAGNOSIS — K219 Gastro-esophageal reflux disease without esophagitis: Secondary | ICD-10-CM | POA: Diagnosis not present

## 2014-08-15 DIAGNOSIS — M23201 Derangement of unspecified lateral meniscus due to old tear or injury, left knee: Secondary | ICD-10-CM | POA: Diagnosis not present

## 2014-08-15 DIAGNOSIS — M797 Fibromyalgia: Secondary | ICD-10-CM | POA: Diagnosis not present

## 2014-08-15 DIAGNOSIS — M23204 Derangement of unspecified medial meniscus due to old tear or injury, left knee: Secondary | ICD-10-CM | POA: Diagnosis not present

## 2014-08-15 DIAGNOSIS — F17210 Nicotine dependence, cigarettes, uncomplicated: Secondary | ICD-10-CM | POA: Diagnosis not present

## 2014-08-15 DIAGNOSIS — Z952 Presence of prosthetic heart valve: Secondary | ICD-10-CM | POA: Diagnosis not present

## 2014-08-15 DIAGNOSIS — I2510 Atherosclerotic heart disease of native coronary artery without angina pectoris: Secondary | ICD-10-CM | POA: Diagnosis not present

## 2014-08-15 DIAGNOSIS — I739 Peripheral vascular disease, unspecified: Secondary | ICD-10-CM | POA: Diagnosis not present

## 2014-08-15 DIAGNOSIS — Z7901 Long term (current) use of anticoagulants: Secondary | ICD-10-CM | POA: Diagnosis not present

## 2014-08-15 LAB — PROTIME-INR
INR: 1.09 (ref 0.00–1.49)
Prothrombin Time: 14.3 seconds (ref 11.6–15.2)

## 2014-08-16 ENCOUNTER — Ambulatory Visit (HOSPITAL_BASED_OUTPATIENT_CLINIC_OR_DEPARTMENT_OTHER): Payer: 59 | Admitting: Anesthesiology

## 2014-08-16 ENCOUNTER — Ambulatory Visit (HOSPITAL_BASED_OUTPATIENT_CLINIC_OR_DEPARTMENT_OTHER)
Admission: RE | Admit: 2014-08-16 | Discharge: 2014-08-16 | Disposition: A | Discharge status: Home / Self Care | Payer: 59 | Source: Ambulatory Visit | Attending: Orthopedic Surgery | Admitting: Orthopedic Surgery

## 2014-08-16 ENCOUNTER — Encounter (HOSPITAL_BASED_OUTPATIENT_CLINIC_OR_DEPARTMENT_OTHER): Payer: Self-pay | Admitting: Anesthesiology

## 2014-08-16 ENCOUNTER — Encounter (HOSPITAL_BASED_OUTPATIENT_CLINIC_OR_DEPARTMENT_OTHER)
Admission: RE | Disposition: A | Discharge status: Home / Self Care | Payer: Self-pay | Source: Ambulatory Visit | Attending: Orthopedic Surgery

## 2014-08-16 DIAGNOSIS — K219 Gastro-esophageal reflux disease without esophagitis: Secondary | ICD-10-CM | POA: Insufficient documentation

## 2014-08-16 DIAGNOSIS — Z7901 Long term (current) use of anticoagulants: Secondary | ICD-10-CM | POA: Insufficient documentation

## 2014-08-16 DIAGNOSIS — M23201 Derangement of unspecified lateral meniscus due to old tear or injury, left knee: Secondary | ICD-10-CM | POA: Insufficient documentation

## 2014-08-16 DIAGNOSIS — Z720 Tobacco use: Secondary | ICD-10-CM | POA: Diagnosis present

## 2014-08-16 DIAGNOSIS — M23204 Derangement of unspecified medial meniscus due to old tear or injury, left knee: Secondary | ICD-10-CM | POA: Insufficient documentation

## 2014-08-16 DIAGNOSIS — Z952 Presence of prosthetic heart valve: Secondary | ICD-10-CM | POA: Insufficient documentation

## 2014-08-16 DIAGNOSIS — M797 Fibromyalgia: Secondary | ICD-10-CM | POA: Insufficient documentation

## 2014-08-16 DIAGNOSIS — I2510 Atherosclerotic heart disease of native coronary artery without angina pectoris: Secondary | ICD-10-CM | POA: Insufficient documentation

## 2014-08-16 DIAGNOSIS — J449 Chronic obstructive pulmonary disease, unspecified: Secondary | ICD-10-CM | POA: Insufficient documentation

## 2014-08-16 DIAGNOSIS — I6529 Occlusion and stenosis of unspecified carotid artery: Secondary | ICD-10-CM | POA: Diagnosis present

## 2014-08-16 DIAGNOSIS — F17210 Nicotine dependence, cigarettes, uncomplicated: Secondary | ICD-10-CM | POA: Insufficient documentation

## 2014-08-16 DIAGNOSIS — S83282A Other tear of lateral meniscus, current injury, left knee, initial encounter: Secondary | ICD-10-CM | POA: Diagnosis present

## 2014-08-16 DIAGNOSIS — I739 Peripheral vascular disease, unspecified: Secondary | ICD-10-CM | POA: Insufficient documentation

## 2014-08-16 HISTORY — PX: KNEE ARTHROSCOPY WITH MEDIAL MENISECTOMY: SHX5651

## 2014-08-16 HISTORY — PX: KNEE ARTHROSCOPY WITH LATERAL MENISECTOMY: SHX6193

## 2014-08-16 LAB — POCT HEMOGLOBIN-HEMACUE: Hemoglobin: 15.3 g/dL — ABNORMAL HIGH (ref 12.0–15.0)

## 2014-08-16 SURGERY — ARTHROSCOPY, KNEE, WITH LATERAL MENISCECTOMY
Anesthesia: General | Site: Knee | Laterality: Left

## 2014-08-16 MED ORDER — TRAMADOL HCL 50 MG PO TABS: ORAL_TABLET | ORAL | Status: DC

## 2014-08-16 MED ORDER — ONDANSETRON HCL 4 MG/2ML IJ SOLN
4.0000 mg | Freq: Once | INTRAMUSCULAR | Status: AC | PRN
  Administered 2014-08-16: 4 mg via INTRAVENOUS

## 2014-08-16 MED ORDER — MIDAZOLAM HCL 2 MG/2ML IJ SOLN
1.0000 mg | INTRAMUSCULAR | Status: DC | PRN
  Administered 2014-08-16: 2 mg via INTRAVENOUS

## 2014-08-16 MED ORDER — HYDROMORPHONE HCL 1 MG/ML IJ SOLN
0.2500 mg | INTRAMUSCULAR | Status: DC | PRN
  Administered 2014-08-16: 0.5 mg via INTRAVENOUS

## 2014-08-16 MED ORDER — LACTATED RINGERS IV SOLN
INTRAVENOUS | Status: DC
  Administered 2014-08-16 (×2): via INTRAVENOUS

## 2014-08-16 MED ORDER — ONDANSETRON HCL 4 MG/2ML IJ SOLN
INTRAMUSCULAR | Status: DC | PRN
Start: 2014-08-16 — End: 2014-08-16
  Administered 2014-08-16: 4 mg via INTRAVENOUS

## 2014-08-16 MED ORDER — CEFAZOLIN SODIUM-DEXTROSE 2-3 GM-% IV SOLR
2.0000 g | INTRAVENOUS | Status: AC
  Administered 2014-08-16: 2 g via INTRAVENOUS

## 2014-08-16 MED ORDER — DEXAMETHASONE SODIUM PHOSPHATE 4 MG/ML IJ SOLN
INTRAMUSCULAR | Status: DC | PRN
  Administered 2014-08-16: 5 mg via INTRAVENOUS

## 2014-08-16 MED ORDER — FENTANYL CITRATE 0.05 MG/ML IJ SOLN
50.0000 ug | INTRAMUSCULAR | Status: DC | PRN
  Administered 2014-08-16: 100 ug via INTRAVENOUS

## 2014-08-16 MED ORDER — MIDAZOLAM HCL 2 MG/2ML IJ SOLN
INTRAMUSCULAR | Status: AC
  Filled 2014-08-16: qty 2

## 2014-08-16 MED ORDER — EPINEPHRINE HCL 1 MG/ML IJ SOLN
INTRAMUSCULAR | Status: AC
  Filled 2014-08-16: qty 1

## 2014-08-16 MED ORDER — SODIUM CHLORIDE 0.9 % IR SOLN
Status: DC | PRN
  Administered 2014-08-16: 6000 mL

## 2014-08-16 MED ORDER — PROPOFOL 10 MG/ML IV BOLUS
INTRAVENOUS | Status: DC | PRN
  Administered 2014-08-16: 200 mg via INTRAVENOUS

## 2014-08-16 MED ORDER — PROPOFOL 10 MG/ML IV BOLUS
INTRAVENOUS | Status: AC
  Filled 2014-08-16: qty 20

## 2014-08-16 MED ORDER — BUPIVACAINE-EPINEPHRINE (PF) 0.25% -1:200000 IJ SOLN
INTRAMUSCULAR | Status: AC
  Filled 2014-08-16: qty 90

## 2014-08-16 MED ORDER — OXYCODONE HCL 5 MG/5ML PO SOLN: 5.0000 mg | Freq: Once | ORAL | Status: DC | PRN

## 2014-08-16 MED ORDER — OXYCODONE HCL 5 MG PO TABS: 5.0000 mg | ORAL_TABLET | Freq: Once | ORAL | Status: DC | PRN

## 2014-08-16 MED ORDER — ONDANSETRON HCL 4 MG/2ML IJ SOLN
INTRAMUSCULAR | Status: AC
  Filled 2014-08-16: qty 2

## 2014-08-16 MED ORDER — EPINEPHRINE HCL 1 MG/ML IJ SOLN
INTRAMUSCULAR | Status: DC | PRN
  Administered 2014-08-16: 1 mg

## 2014-08-16 MED ORDER — HYDROMORPHONE HCL 1 MG/ML IJ SOLN
INTRAMUSCULAR | Status: AC
  Filled 2014-08-16: qty 1

## 2014-08-16 MED ORDER — BUPIVACAINE-EPINEPHRINE (PF) 0.5% -1:200000 IJ SOLN
INTRAMUSCULAR | Status: AC
  Filled 2014-08-16: qty 90

## 2014-08-16 MED ORDER — CEFAZOLIN SODIUM 1-5 GM-% IV SOLN
INTRAVENOUS | Status: AC
  Filled 2014-08-16: qty 100

## 2014-08-16 MED ORDER — FENTANYL CITRATE 0.05 MG/ML IJ SOLN
INTRAMUSCULAR | Status: DC | PRN
  Administered 2014-08-16: 100 ug via INTRAVENOUS

## 2014-08-16 MED ORDER — CHLORHEXIDINE GLUCONATE 4 % EX LIQD: 60.0000 mL | Freq: Once | CUTANEOUS | Status: DC

## 2014-08-16 MED ORDER — FENTANYL CITRATE 0.05 MG/ML IJ SOLN
INTRAMUSCULAR | Status: AC
  Filled 2014-08-16: qty 4

## 2014-08-16 MED ORDER — LIDOCAINE HCL (CARDIAC) 20 MG/ML IV SOLN
INTRAVENOUS | Status: DC | PRN
  Administered 2014-08-16: 80 mg via INTRAVENOUS

## 2014-08-16 MED ORDER — FENTANYL CITRATE 0.05 MG/ML IJ SOLN
INTRAMUSCULAR | Status: AC
  Filled 2014-08-16: qty 2

## 2014-08-16 SURGICAL SUPPLY — 42 items
BANDAGE ELASTIC 6 VELCRO ST LF (GAUZE/BANDAGES/DRESSINGS) ×2 IMPLANT
BLADE CUTTER GATOR 3.5 (BLADE) ×2 IMPLANT
BLADE GREAT WHITE 4.2 (BLADE) ×2 IMPLANT
BLADE SURG 15 STRL LF DISP TIS (BLADE) IMPLANT
BLADE SURG 15 STRL SS (BLADE)
BNDG COHESIVE 4X5 TAN STRL (GAUZE/BANDAGES/DRESSINGS) ×2 IMPLANT
DRAPE ARTHROSCOPY W/POUCH 90 (DRAPES) ×2 IMPLANT
DURAPREP 26ML APPLICATOR (WOUND CARE) ×2 IMPLANT
GAUZE SPONGE 4X4 12PLY STRL (GAUZE/BANDAGES/DRESSINGS) ×2 IMPLANT
GAUZE XEROFORM 1X8 LF (GAUZE/BANDAGES/DRESSINGS) ×2 IMPLANT
GLOVE BIO SURGEON STRL SZ 6.5 (GLOVE) ×4 IMPLANT
GLOVE BIO SURGEON STRL SZ7 (GLOVE) ×2 IMPLANT
GLOVE BIO SURGEON STRL SZ7.5 (GLOVE) ×2 IMPLANT
GLOVE BIOGEL PI IND STRL 7.0 (GLOVE) ×2 IMPLANT
GLOVE BIOGEL PI IND STRL 7.5 (GLOVE) ×2 IMPLANT
GLOVE BIOGEL PI INDICATOR 7.0 (GLOVE) ×2
GLOVE BIOGEL PI INDICATOR 7.5 (GLOVE) ×2
GLOVE SS BIOGEL STRL SZ 7.5 (GLOVE) ×1 IMPLANT
GLOVE SUPERSENSE BIOGEL SZ 7.5 (GLOVE) ×1
GOWN STRL REUS W/ TWL LRG LVL3 (GOWN DISPOSABLE) ×4 IMPLANT
GOWN STRL REUS W/TWL LRG LVL3 (GOWN DISPOSABLE) ×4
HOLDER KNEE FOAM BLUE (MISCELLANEOUS) ×2 IMPLANT
KNEE WRAP E Z 3 GEL PACK (MISCELLANEOUS) ×2 IMPLANT
MANIFOLD NEPTUNE II (INSTRUMENTS) ×2 IMPLANT
NDL SAFETY ECLIPSE 18X1.5 (NEEDLE) ×2 IMPLANT
NEEDLE HYPO 18GX1.5 SHARP (NEEDLE) ×2
NEEDLE HYPO 22GX1.5 SAFETY (NEEDLE) IMPLANT
PACK ARTHROSCOPY DSU (CUSTOM PROCEDURE TRAY) ×2 IMPLANT
PACK BASIN DAY SURGERY FS (CUSTOM PROCEDURE TRAY) ×2 IMPLANT
PAD ALCOHOL SWAB (MISCELLANEOUS) ×8 IMPLANT
SET ARTHROSCOPY TUBING (MISCELLANEOUS) ×1
SET ARTHROSCOPY TUBING LN (MISCELLANEOUS) ×1 IMPLANT
SUCTION FRAZIER TIP 10 FR DISP (SUCTIONS) IMPLANT
SUT ETHILON 4 0 PS 2 18 (SUTURE) ×2 IMPLANT
SUT PROLENE 3 0 PS 2 (SUTURE) IMPLANT
SUT VIC AB 3-0 PS1 18 (SUTURE)
SUT VIC AB 3-0 PS1 18XBRD (SUTURE) IMPLANT
SYR 20CC LL (SYRINGE) ×2 IMPLANT
SYR 5ML LL (SYRINGE) ×2 IMPLANT
TOWEL OR 17X24 6PK STRL BLUE (TOWEL DISPOSABLE) ×2 IMPLANT
WAND STAR VAC 90 (SURGICAL WAND) ×2 IMPLANT
WATER STERILE IRR 1000ML POUR (IV SOLUTION) ×2 IMPLANT

## 2014-08-16 NOTE — Interval H&P Note (Signed)
History and Physical Interval Note:  08/16/2014 8:32 AM  Judy Sawyer  has presented today for surgery, with the diagnosis of DERANGEMENT OF OTHER LATERAL MENISCUS DUE TO OLD TEAR OR INJURY, LEFT KNEE  The various methods of treatment have been discussed with the patient and family. After consideration of risks, benefits and other options for treatment, the patient has consented to  Procedure(s): LEFT KNEE ARTHROSCOPY WITH LATERAL MENISECTOMY (Left) as a surgical intervention .  The patient's history has been reviewed, patient examined, no change in status, stable for surgery.  I have reviewed the patient's chart and labs.  Questions were answered to the patient's satisfaction.     Elsie Saas A

## 2014-08-16 NOTE — Op Note (Signed)
[name], Judy Sawyer NO.:  [id number]  MEDICAL RECORD NO.:  [id number]  LOCATION:                                 FACILITY:  PHYSICIAN:  Yee Joss A. Noemi Chapel, M.D. DATE OF BIRTH:  [birth year]  DATE OF PROCEDURE:  08/16/2014 DATE OF DISCHARGE:  08/16/2014                              OPERATIVE REPORT   PREOPERATIVE DIAGNOSIS:  Left knee chronic, nontraumatic medial and lateral meniscal tears.  POSTOPERATIVE DIAGNOSIS:  Left knee chronic, nontraumatic medial and lateral meniscal tears  PROCEDURE:  Left knee EUA, followed by arthroscopic partial medial and lateral meniscectomies.  SURGEON:  Audree Camel. Noemi Chapel, M.D.  ASSISTANT:  Kirstin Shepperson, PA-C.  ANESTHESIA:  General.  OPERATIVE TIME:  30 minutes.  COMPLICATIONS:  None.  INDICATION FOR PROCEDURE:  The patient is a 59 year old woman who has had significant increasing left knee pain over the past 6 months with exam and CT arthrogram revealing meniscal tearing.  She has failed conservative care and is now to undergo arthroscopy.  DESCRIPTION:  The patient was brought to the operating room on August 16, 2014 after a knee block was placed in holding room by anesthesia. She was placed on operative table in supine position.  She received antibiotics preoperatively for prophylaxis.  After being placed under general anesthesia, her left knee was examined.  She had full range of motion.  Knee was stable.  Ligamentous exam with normal patellar tracking.  Left leg was prepped using sterile DuraPrep and draped using sterile technique.  Time-out procedure was called and the correct left knee identified.  Initially, through an anterolateral portal, the arthroscope with a pump attached was placed into an anteromedial portal and arthroscopic probe was placed.  On initial inspection of the medial compartment, the articular cartilage showed only grade 1-2 chondromalacia.  Medial meniscus showed tearing of the  posterior medial horn of which 30-40% was resected back to a stable rim.  Intercondylar notch inspected.  Anterior and posterior cruciate ligaments were normal. Lateral compartment inspected.  The articular cartilage was normal. Lateral meniscus showed tearing of the anterior, lateral, and posterior horn of which 40-50% was resected back to a stable rim.  Patellofemoral joint inspected.  The articular cartilage was normal.  The patella tracked normally.  Medial and lateral gutters were free of pathology. At this point, it was felt that all pathology had been satisfactorily addressed.  The instruments were removed.  Portals were closed with 3-0 nylon suture.  Sterile dressings were applied, and the patient awakened and taken to recovery room in stable condition.  FOLLOWUP CARE:  The patient will be followed as an outpatient on tramadol for pain.  She will restart Lovenox tomorrow and Coumadin tonight per her cardiologist's recommendation.  We will see her back in office in a week for sutures out and follow up.     Jazmaine Fuelling A. Noemi Chapel, M.D.     RAW/MEDQ  D:  08/16/2014  T:  08/16/2014  Job:  [id number]

## 2014-08-16 NOTE — Anesthesia Procedure Notes (Addendum)
Date/Time: 08/16/2014 8:41 AM Performed by: Golden Hurter C   Procedure Name: LMA Insertion Date/Time: 08/16/2014 8:41 AM Performed by: Maryella Shivers Pre-anesthesia Checklist: Patient identified, Emergency Drugs available, Suction available and Patient being monitored Patient Re-evaluated:Patient Re-evaluated prior to inductionOxygen Delivery Method: Circle System Utilized Preoxygenation: Pre-oxygenation with 100% oxygen Intubation Type: IV induction Ventilation: Mask ventilation without difficulty LMA: LMA inserted LMA Size: 4.0 Number of attempts: 1 Airway Equipment and Method: Bite block Placement Confirmation: positive ETCO2 Tube secured with: Tape Dental Injury: Teeth and Oropharynx as per pre-operative assessment     Left Knee Injection:  Skin prep:  25 and 18g needles in two lower ports, 15 ml of 0.5 Marcaine with 1:200000 Epi. Tolerated well.

## 2014-08-16 NOTE — Transfer of Care (Signed)
Immediate Anesthesia Transfer of Care Note  Patient: Judy Sawyer  Procedure(s) Performed: Procedure(s): LEFT KNEE ARTHROSCOPY WITH LATERAL MENISECTOMY (Left) KNEE ARTHROSCOPY WITH MENISCAL REPAIR (Left)  Patient Location: PACU  Anesthesia Type:General  Level of Consciousness: sedated  Airway & Oxygen Therapy: Patient Spontanous Breathing and Patient connected to face mask oxygen  Post-op Assessment: Report given to RN and Post -op Vital signs reviewed and stable  Post vital signs: Reviewed and stable  Last Vitals:  Filed Vitals:   08/16/14 0824  BP:   Pulse: 91  Temp:   Resp: 11    Complications: No apparent anesthesia complications

## 2014-08-16 NOTE — Anesthesia Postprocedure Evaluation (Signed)
  Anesthesia Post-op Note  Patient: Judy Sawyer  Procedure(s) Performed: Procedure(s): LEFT KNEE ARTHROSCOPY WITH LATERAL MENISECTOMY (Left) KNEE ARTHROSCOPY WITH MEDIAL MENISECTOMY (Left)  Patient Location: PACU  Anesthesia Type: General   Level of Consciousness: awake, alert  and oriented  Airway and Oxygen Therapy: Patient Spontanous Breathing  Post-op Pain: mild  Post-op Assessment: Post-op Vital signs reviewed  Post-op Vital Signs: Reviewed  Last Vitals:  Filed Vitals:   08/16/14 1015  BP: 119/71  Pulse: 78  Temp:   Resp: 21    Complications: No apparent anesthesia complications

## 2014-08-16 NOTE — Discharge Instructions (Signed)
°  Post Anesthesia Home Care Instructions ° °Activity: °Get plenty of rest for the remainder of the day. A responsible adult should stay with you for 24 hours following the procedure.  °For the next 24 hours, DO NOT: °-Drive a car °-Operate machinery °-Drink alcoholic beverages °-Take any medication unless instructed by your physician °-Make any legal decisions or sign important papers. ° °Meals: °Start with liquid foods such as gelatin or soup. Progress to regular foods as tolerated. Avoid greasy, spicy, heavy foods. If nausea and/or vomiting occur, drink only clear liquids until the nausea and/or vomiting subsides. Call your physician if vomiting continues. ° °Special Instructions/Symptoms: °Your throat may feel dry or sore from the anesthesia or the breathing tube placed in your throat during surgery. If this causes discomfort, gargle with warm salt water. The discomfort should disappear within 24 hours. ° °Regional Anesthesia Blocks ° °1. Numbness or the inability to move the "blocked" extremity may last from 3-48 hours after placement. The length of time depends on the medication injected and your individual response to the medication. If the numbness is not going away after 48 hours, call your surgeon. ° °2. The extremity that is blocked will need to be protected until the numbness is gone and the  Strength has returned. Because you cannot feel it, you will need to take extra care to avoid injury. Because it may be weak, you may have difficulty moving it or using it. You may not know what position it is in without looking at it while the block is in effect. ° °3. For blocks in the legs and feet, returning to weight bearing and walking needs to be done carefully. You will need to wait until the numbness is entirely gone and the strength has returned. You should be able to move your leg and foot normally before you try and bear weight or walk. You will need someone to be with you when you first try to ensure you  do not fall and possibly risk injury. ° °4. Bruising and tenderness at the needle site are common side effects and will resolve in a few days. ° °5. Persistent numbness or new problems with movement should be communicated to the surgeon or the [facility] ([phone])/ [facility] ([phone]). °

## 2014-08-16 NOTE — Anesthesia Preprocedure Evaluation (Addendum)
Anesthesia Evaluation  Patient identified by MRN, date of birth, ID band Patient awake    Reviewed: Allergy & Precautions, NPO status , Patient's Chart, lab work & pertinent test results  Airway Mallampati: I  TM Distance: >3 FB Neck ROM: Full    Dental  (+) Teeth Intact, Dental Advisory Given   Pulmonary COPDCurrent Smoker,  breath sounds clear to auscultation        Cardiovascular + Peripheral Vascular Disease - CAD + Valvular Problems/Murmurs (Hx of AVR) Rhythm:Regular Rate:Normal     Neuro/Psych    GI/Hepatic GERD-  Medicated and Controlled,  Endo/Other    Renal/GU      Musculoskeletal   Abdominal   Peds  Hematology   Anesthesia Other Findings   Reproductive/Obstetrics                            Anesthesia Physical Anesthesia Plan  ASA: III  Anesthesia Plan: General   Post-op Pain Management:    Induction: Intravenous  Airway Management Planned: LMA  Additional Equipment:   Intra-op Plan:   Post-operative Plan: Extubation in OR  Informed Consent: I have reviewed the patients History and Physical, chart, labs and discussed the procedure including the risks, benefits and alternatives for the proposed anesthesia with the patient or authorized representative who has indicated his/her understanding and acceptance.   Dental advisory given  Plan Discussed with: CRNA, Anesthesiologist and Surgeon  Anesthesia Plan Comments:         Anesthesia Quick Evaluation

## 2014-08-16 NOTE — Progress Notes (Signed)
Assisted Dr. Al Corpus with left, knee block. Side rails up, monitors on throughout procedure. See vital signs in flow sheet. Tolerated Procedure well.

## 2014-08-17 ENCOUNTER — Encounter (HOSPITAL_BASED_OUTPATIENT_CLINIC_OR_DEPARTMENT_OTHER): Payer: Self-pay | Admitting: Orthopedic Surgery

## 2014-08-17 ENCOUNTER — Telehealth: Payer: Self-pay | Admitting: *Deleted

## 2014-08-17 NOTE — Telephone Encounter (Signed)
Pt had knee surgery on 2/3.  Was off coumadin 5 days prior.  INR at pre-op 2/2 was 1.9.  No coumadin that night. Pt was d/c home 2/3 with Rx for Tramadol 50mg  1-2 tablet q4-6 hr prn.  Pt was told to take coumadin 2.5mg  on 2/3, 2/4, 2/5, then 1.25mg  2/6, 2/7, 2/8,  2.5mg  on 2/9 and 1.25mg  on 2/10.  Pt has INR check on 2/11.  She verbalized understanding.

## 2014-08-24 ENCOUNTER — Ambulatory Visit (INDEPENDENT_AMBULATORY_CARE_PROVIDER_SITE_OTHER): Payer: 59 | Admitting: *Deleted

## 2014-08-24 DIAGNOSIS — Z952 Presence of prosthetic heart valve: Secondary | ICD-10-CM

## 2014-08-24 DIAGNOSIS — Z954 Presence of other heart-valve replacement: Secondary | ICD-10-CM

## 2014-08-24 DIAGNOSIS — Z5181 Encounter for therapeutic drug level monitoring: Secondary | ICD-10-CM

## 2014-08-24 DIAGNOSIS — Z7901 Long term (current) use of anticoagulants: Secondary | ICD-10-CM

## 2014-08-24 LAB — POCT INR: INR: 1.9

## 2014-09-05 ENCOUNTER — Ambulatory Visit (INDEPENDENT_AMBULATORY_CARE_PROVIDER_SITE_OTHER): Payer: 59 | Admitting: *Deleted

## 2014-09-05 DIAGNOSIS — Z952 Presence of prosthetic heart valve: Secondary | ICD-10-CM

## 2014-09-05 DIAGNOSIS — Z5181 Encounter for therapeutic drug level monitoring: Secondary | ICD-10-CM

## 2014-09-05 DIAGNOSIS — Z954 Presence of other heart-valve replacement: Secondary | ICD-10-CM

## 2014-09-05 DIAGNOSIS — Z7901 Long term (current) use of anticoagulants: Secondary | ICD-10-CM

## 2014-09-05 LAB — POCT INR: INR: 2.4

## 2014-09-21 ENCOUNTER — Ambulatory Visit (INDEPENDENT_AMBULATORY_CARE_PROVIDER_SITE_OTHER): Payer: 59 | Admitting: *Deleted

## 2014-09-21 DIAGNOSIS — Z954 Presence of other heart-valve replacement: Secondary | ICD-10-CM

## 2014-09-21 DIAGNOSIS — Z5181 Encounter for therapeutic drug level monitoring: Secondary | ICD-10-CM

## 2014-09-21 DIAGNOSIS — Z7901 Long term (current) use of anticoagulants: Secondary | ICD-10-CM

## 2014-09-21 DIAGNOSIS — Z952 Presence of prosthetic heart valve: Secondary | ICD-10-CM

## 2014-09-21 LAB — POCT INR: INR: 2.1

## 2014-10-10 ENCOUNTER — Ambulatory Visit (INDEPENDENT_AMBULATORY_CARE_PROVIDER_SITE_OTHER): Payer: 59 | Admitting: *Deleted

## 2014-10-10 DIAGNOSIS — Z952 Presence of prosthetic heart valve: Secondary | ICD-10-CM

## 2014-10-10 DIAGNOSIS — Z5181 Encounter for therapeutic drug level monitoring: Secondary | ICD-10-CM

## 2014-10-10 DIAGNOSIS — Z954 Presence of other heart-valve replacement: Secondary | ICD-10-CM | POA: Diagnosis not present

## 2014-10-10 DIAGNOSIS — Z7901 Long term (current) use of anticoagulants: Secondary | ICD-10-CM

## 2014-10-10 LAB — POCT INR: INR: 2.1

## 2014-11-07 ENCOUNTER — Ambulatory Visit (INDEPENDENT_AMBULATORY_CARE_PROVIDER_SITE_OTHER): Payer: 59 | Admitting: *Deleted

## 2014-11-07 DIAGNOSIS — Z5181 Encounter for therapeutic drug level monitoring: Secondary | ICD-10-CM

## 2014-11-07 DIAGNOSIS — Z954 Presence of other heart-valve replacement: Secondary | ICD-10-CM

## 2014-11-07 DIAGNOSIS — Z952 Presence of prosthetic heart valve: Secondary | ICD-10-CM

## 2014-11-07 DIAGNOSIS — Z7901 Long term (current) use of anticoagulants: Secondary | ICD-10-CM

## 2014-11-07 LAB — POCT INR: INR: 1.7

## 2014-11-28 ENCOUNTER — Ambulatory Visit (INDEPENDENT_AMBULATORY_CARE_PROVIDER_SITE_OTHER): Payer: 59 | Admitting: *Deleted

## 2014-11-28 DIAGNOSIS — Z5181 Encounter for therapeutic drug level monitoring: Secondary | ICD-10-CM | POA: Diagnosis not present

## 2014-11-28 DIAGNOSIS — Z954 Presence of other heart-valve replacement: Secondary | ICD-10-CM | POA: Diagnosis not present

## 2014-11-28 DIAGNOSIS — Z7901 Long term (current) use of anticoagulants: Secondary | ICD-10-CM

## 2014-11-28 DIAGNOSIS — Z952 Presence of prosthetic heart valve: Secondary | ICD-10-CM

## 2014-11-28 LAB — POCT INR: INR: 2.1

## 2014-12-19 ENCOUNTER — Ambulatory Visit (INDEPENDENT_AMBULATORY_CARE_PROVIDER_SITE_OTHER): Payer: 59 | Admitting: *Deleted

## 2014-12-19 DIAGNOSIS — Z954 Presence of other heart-valve replacement: Secondary | ICD-10-CM

## 2014-12-19 DIAGNOSIS — Z952 Presence of prosthetic heart valve: Secondary | ICD-10-CM

## 2014-12-19 DIAGNOSIS — Z7901 Long term (current) use of anticoagulants: Secondary | ICD-10-CM

## 2014-12-19 DIAGNOSIS — Z5181 Encounter for therapeutic drug level monitoring: Secondary | ICD-10-CM

## 2014-12-19 LAB — POCT INR: INR: 2.3

## 2015-01-16 ENCOUNTER — Ambulatory Visit (INDEPENDENT_AMBULATORY_CARE_PROVIDER_SITE_OTHER): Payer: 59 | Admitting: *Deleted

## 2015-01-16 DIAGNOSIS — Z7901 Long term (current) use of anticoagulants: Secondary | ICD-10-CM | POA: Diagnosis not present

## 2015-01-16 DIAGNOSIS — Z5181 Encounter for therapeutic drug level monitoring: Secondary | ICD-10-CM

## 2015-01-16 DIAGNOSIS — Z954 Presence of other heart-valve replacement: Secondary | ICD-10-CM | POA: Diagnosis not present

## 2015-01-16 DIAGNOSIS — Z952 Presence of prosthetic heart valve: Secondary | ICD-10-CM

## 2015-01-16 LAB — POCT INR: INR: 2.2

## 2015-02-27 ENCOUNTER — Ambulatory Visit (INDEPENDENT_AMBULATORY_CARE_PROVIDER_SITE_OTHER): Payer: 59 | Admitting: *Deleted

## 2015-02-27 DIAGNOSIS — Z5181 Encounter for therapeutic drug level monitoring: Secondary | ICD-10-CM

## 2015-02-27 DIAGNOSIS — Z7901 Long term (current) use of anticoagulants: Secondary | ICD-10-CM

## 2015-02-27 DIAGNOSIS — Z952 Presence of prosthetic heart valve: Secondary | ICD-10-CM

## 2015-02-27 DIAGNOSIS — Z954 Presence of other heart-valve replacement: Secondary | ICD-10-CM

## 2015-02-27 LAB — POCT INR: INR: 2.5

## 2015-03-16 ENCOUNTER — Ambulatory Visit (INDEPENDENT_AMBULATORY_CARE_PROVIDER_SITE_OTHER): Payer: 59 | Admitting: Cardiovascular Disease

## 2015-03-16 ENCOUNTER — Encounter: Payer: Self-pay | Admitting: Cardiovascular Disease

## 2015-03-16 VITALS — BP 121/68 | HR 75 | Ht 63.0 in | Wt 115.0 lb

## 2015-03-16 DIAGNOSIS — Z954 Presence of other heart-valve replacement: Secondary | ICD-10-CM

## 2015-03-16 DIAGNOSIS — Z7901 Long term (current) use of anticoagulants: Secondary | ICD-10-CM

## 2015-03-16 DIAGNOSIS — R0789 Other chest pain: Secondary | ICD-10-CM | POA: Diagnosis not present

## 2015-03-16 DIAGNOSIS — Z952 Presence of prosthetic heart valve: Secondary | ICD-10-CM

## 2015-03-16 DIAGNOSIS — I10 Essential (primary) hypertension: Secondary | ICD-10-CM | POA: Diagnosis not present

## 2015-03-16 DIAGNOSIS — Z716 Tobacco abuse counseling: Secondary | ICD-10-CM

## 2015-03-16 DIAGNOSIS — R002 Palpitations: Secondary | ICD-10-CM

## 2015-03-16 DIAGNOSIS — I6529 Occlusion and stenosis of unspecified carotid artery: Secondary | ICD-10-CM

## 2015-03-16 NOTE — Patient Instructions (Signed)
   Referral to Cardiac Rehab Continue all current medications. Your physician wants you to follow up in:  1 year.  You will receive a reminder letter in the mail one-two months in advance.  If you don't receive a letter, please call our office to schedule the follow up appointment

## 2015-03-16 NOTE — Progress Notes (Signed)
Patient ID: Judy Sawyer, female   DOB: 18-Mar-1956, 59 y.o.   MRN: [id number]      SUBJECTIVE: The patient presents for routine follow up. She has occasional chest pains when lifting heavy objects. She continues to smoke less than a half pack of cigarettes daily. She was never able to participate in cardiac rehabilitation because she moved to [place] from [place] shortly after her valve replacement surgery. She occasionally appreciates "skipped beats" when lying down at night and when she is unable to fall sleep.  ECG performed in the office today demonstrates normal sinus rhythm with no ischemic ST segment or T-wave abnormalities, nor any arrhythmias.   Review of Systems: As per "subjective", otherwise negative.  Allergies  Allergen Reactions  . Codeine     Nausea     Current Outpatient Prescriptions  Medication Sig Dispense Refill  . aspirin 81 MG tablet Take 81 mg by mouth daily.    . pantoprazole (PROTONIX) 40 MG tablet Take 40 mg by mouth daily.    . pravastatin (PRAVACHOL) 20 MG tablet Take 20 mg by mouth daily.    . Sennosides 8.6 MG CAPS Take by mouth.    . warfarin (COUMADIN) 2.5 MG tablet Take 2.5 mg by mouth daily.     No current facility-administered medications for this visit.    Past Medical History  Diagnosis Date  . Anemia, unspecified   . Acute bronchitis   . Chest pain   . Murmur   . Fibromyalgia   . Tobacco abuse   . Aortic stenosis     previous cardiologisit: Dr. Remigio Eisenmenger in [place], [place]  . Carotid stenosis 05/13    mild less than 50% bilaterally  . COPD (chronic obstructive pulmonary disease)   . GERD (gastroesophageal reflux disease)     Past Surgical History  Procedure Laterality Date  . Aortic valve replacement  11/2011    in [place]. St. Jude 19 mm  . Tonsillectomy    . Abdominal hysterectomy    . Colonoscopy    . Upper gi endoscopy    . Knee arthroscopy with lateral menisectomy Left 08/16/2014    Procedure: LEFT KNEE  ARTHROSCOPY WITH LATERAL MENISECTOMY;  Surgeon: Lorn Junes, MD;  Location: [facility];  Service: Orthopedics;  Laterality: Left;  . Knee arthroscopy with medial menisectomy Left 08/16/2014    Procedure: KNEE ARTHROSCOPY WITH MEDIAL MENISECTOMY;  Surgeon: Lorn Junes, MD;  Location: [facility];  Service: Orthopedics;  Laterality: Left;    Social History   Social History  . Marital Status: Single    Spouse Name: N/A  . Number of Children: N/A  . Years of Education: N/A   Occupational History  . Not on file.   Social History Main Topics  . Smoking status: Current Every Day Smoker -- 0.50 packs/day for 42 years    Types: Cigarettes    Start date: 07/14/1969  . Smokeless tobacco: Never Used  . Alcohol Use: No  . Drug Use: No  . Sexual Activity: Not on file   Other Topics Concern  . Not on file   Social History Narrative     Filed Vitals:   03/16/15 1306  BP: 121/68  Pulse: 75  Height: '5\' 3"'$  (1.6 m)  Weight: 115 lb (52.164 kg)    PHYSICAL EXAM General: NAD  Neck: No JVD, no thyromegaly or thyroid nodule.  Lungs: Clear to auscultation bilaterally with normal respiratory effort.  CV: Nondisplaced PMI. Heart regular  Z3/G6 click, no Y4/I3, I/VI holosystolic murmur along left sternal border. No peripheral edema. No carotid bruit. Normal pedal pulses.  Abdomen: Soft, nontender, no hepatosplenomegaly, no distention.  Neurologic: Alert and oriented x 3.  Psych: Normal affect.  Skin: Normal.  ECG: Most recent ECG reviewed.      ASSESSMENT AND PLAN: 1. S/p AVR: Symptomatically stable since undergoing aortic valve replacement with a mechanical valve in [place]. Continue anticoagulation with warfarin. She had no significant coronary artery disease on preoperative cardiac cath. She is enrolled in our anticoagulation clinic.  Will make referral to cardiac rehabilitation to help improve overall muscle tone and cardiorespiratory fitness  given musculoskeletal chest pains.  2. Carotid artery stenosis: This was mild and bilateral. Continue ASA 81 mg.  3. Tobacco abuse: Cessation counseling again provided (5 minutes). She and her husband want to quit altogether.  4. Essential HTN: Controlled on present therapy. No changes.  5. Palpitations: Possible PVC's with compensatory pauses based upon her description. If they become more frequent or she becomes more symptomatic, I would consider cardiac monitoring.  Dispo: f/u 1 year.  Kate Sable, M.D., F.A.C.C.

## 2015-03-29 ENCOUNTER — Ambulatory Visit (INDEPENDENT_AMBULATORY_CARE_PROVIDER_SITE_OTHER): Payer: 59 | Admitting: *Deleted

## 2015-03-29 DIAGNOSIS — Z954 Presence of other heart-valve replacement: Secondary | ICD-10-CM | POA: Diagnosis not present

## 2015-03-29 DIAGNOSIS — Z952 Presence of prosthetic heart valve: Secondary | ICD-10-CM

## 2015-03-29 DIAGNOSIS — Z5181 Encounter for therapeutic drug level monitoring: Secondary | ICD-10-CM | POA: Diagnosis not present

## 2015-03-29 DIAGNOSIS — Z7901 Long term (current) use of anticoagulants: Secondary | ICD-10-CM | POA: Diagnosis not present

## 2015-03-29 LAB — POCT INR: INR: 3.7

## 2015-04-10 ENCOUNTER — Encounter (HOSPITAL_COMMUNITY): Payer: 59

## 2015-04-12 ENCOUNTER — Ambulatory Visit (INDEPENDENT_AMBULATORY_CARE_PROVIDER_SITE_OTHER): Payer: 59 | Admitting: *Deleted

## 2015-04-12 DIAGNOSIS — Z7901 Long term (current) use of anticoagulants: Secondary | ICD-10-CM | POA: Diagnosis not present

## 2015-04-12 DIAGNOSIS — Z954 Presence of other heart-valve replacement: Secondary | ICD-10-CM

## 2015-04-12 DIAGNOSIS — Z952 Presence of prosthetic heart valve: Secondary | ICD-10-CM

## 2015-04-12 DIAGNOSIS — Z5181 Encounter for therapeutic drug level monitoring: Secondary | ICD-10-CM | POA: Diagnosis not present

## 2015-04-12 LAB — POCT INR: INR: 2.7

## 2015-05-10 ENCOUNTER — Ambulatory Visit (INDEPENDENT_AMBULATORY_CARE_PROVIDER_SITE_OTHER): Payer: 59 | Admitting: *Deleted

## 2015-05-10 DIAGNOSIS — Z5181 Encounter for therapeutic drug level monitoring: Secondary | ICD-10-CM | POA: Diagnosis not present

## 2015-05-10 DIAGNOSIS — Z7901 Long term (current) use of anticoagulants: Secondary | ICD-10-CM

## 2015-05-10 DIAGNOSIS — Z952 Presence of prosthetic heart valve: Secondary | ICD-10-CM

## 2015-05-10 DIAGNOSIS — Z954 Presence of other heart-valve replacement: Secondary | ICD-10-CM | POA: Diagnosis not present

## 2015-05-10 LAB — POCT INR: INR: 3.1

## 2015-06-14 ENCOUNTER — Ambulatory Visit (INDEPENDENT_AMBULATORY_CARE_PROVIDER_SITE_OTHER): Payer: 59 | Admitting: *Deleted

## 2015-06-14 DIAGNOSIS — Z954 Presence of other heart-valve replacement: Secondary | ICD-10-CM

## 2015-06-14 DIAGNOSIS — Z7901 Long term (current) use of anticoagulants: Secondary | ICD-10-CM | POA: Diagnosis not present

## 2015-06-14 DIAGNOSIS — Z5181 Encounter for therapeutic drug level monitoring: Secondary | ICD-10-CM

## 2015-06-14 DIAGNOSIS — Z952 Presence of prosthetic heart valve: Secondary | ICD-10-CM

## 2015-06-14 LAB — POCT INR: INR: 2.9

## 2015-07-03 ENCOUNTER — Telehealth: Payer: Self-pay | Admitting: Cardiovascular Disease

## 2015-07-03 NOTE — Telephone Encounter (Signed)
Spoke with pt and she states on Dec 15th had injection of Rocephin and Decadron for sinus infection and spoke with  MD today and she is now on levaquin '750mg'$  daily times 5 days . Pt instructed to take the levaquin and coumadin as ordered and appt made for her to have her INR checked on Thursday Dec 22nd and she states understanding

## 2015-07-03 NOTE — Telephone Encounter (Signed)
Patient called the office stating that she saw PCP last 06/28/15 was given an antibiotic and steroid for a sinus infection. Patient called her PCP back today because he is no better. They have called in [place]. Patient needs to know about Her dosage of coumdin.  Please call [phone]

## 2015-07-05 ENCOUNTER — Ambulatory Visit (INDEPENDENT_AMBULATORY_CARE_PROVIDER_SITE_OTHER): Payer: 59 | Admitting: *Deleted

## 2015-07-05 DIAGNOSIS — Z5181 Encounter for therapeutic drug level monitoring: Secondary | ICD-10-CM

## 2015-07-05 DIAGNOSIS — Z954 Presence of other heart-valve replacement: Secondary | ICD-10-CM

## 2015-07-05 DIAGNOSIS — Z7901 Long term (current) use of anticoagulants: Secondary | ICD-10-CM

## 2015-07-05 DIAGNOSIS — Z952 Presence of prosthetic heart valve: Secondary | ICD-10-CM

## 2015-07-05 LAB — POCT INR: INR: 2.2

## 2015-07-17 ENCOUNTER — Ambulatory Visit (INDEPENDENT_AMBULATORY_CARE_PROVIDER_SITE_OTHER): Payer: BLUE CROSS/BLUE SHIELD | Admitting: *Deleted

## 2015-07-17 DIAGNOSIS — Z5181 Encounter for therapeutic drug level monitoring: Secondary | ICD-10-CM | POA: Diagnosis not present

## 2015-07-17 DIAGNOSIS — Z7901 Long term (current) use of anticoagulants: Secondary | ICD-10-CM

## 2015-07-17 DIAGNOSIS — Z954 Presence of other heart-valve replacement: Secondary | ICD-10-CM | POA: Diagnosis not present

## 2015-07-17 DIAGNOSIS — Z952 Presence of prosthetic heart valve: Secondary | ICD-10-CM

## 2015-07-17 LAB — POCT INR: INR: 5.2

## 2015-07-19 ENCOUNTER — Ambulatory Visit (INDEPENDENT_AMBULATORY_CARE_PROVIDER_SITE_OTHER): Payer: BLUE CROSS/BLUE SHIELD | Admitting: *Deleted

## 2015-07-19 DIAGNOSIS — Z5181 Encounter for therapeutic drug level monitoring: Secondary | ICD-10-CM | POA: Diagnosis not present

## 2015-07-19 DIAGNOSIS — Z954 Presence of other heart-valve replacement: Secondary | ICD-10-CM | POA: Diagnosis not present

## 2015-07-19 DIAGNOSIS — Z952 Presence of prosthetic heart valve: Secondary | ICD-10-CM

## 2015-07-19 DIAGNOSIS — Z7901 Long term (current) use of anticoagulants: Secondary | ICD-10-CM | POA: Diagnosis not present

## 2015-07-19 LAB — POCT INR: INR: 3

## 2015-08-07 ENCOUNTER — Ambulatory Visit (INDEPENDENT_AMBULATORY_CARE_PROVIDER_SITE_OTHER): Payer: BLUE CROSS/BLUE SHIELD | Admitting: *Deleted

## 2015-08-07 DIAGNOSIS — Z7901 Long term (current) use of anticoagulants: Secondary | ICD-10-CM | POA: Diagnosis not present

## 2015-08-07 DIAGNOSIS — Z952 Presence of prosthetic heart valve: Secondary | ICD-10-CM

## 2015-08-07 DIAGNOSIS — Z954 Presence of other heart-valve replacement: Secondary | ICD-10-CM

## 2015-08-07 DIAGNOSIS — Z5181 Encounter for therapeutic drug level monitoring: Secondary | ICD-10-CM | POA: Diagnosis not present

## 2015-08-07 LAB — POCT INR: INR: 2.6

## 2015-09-04 ENCOUNTER — Ambulatory Visit (INDEPENDENT_AMBULATORY_CARE_PROVIDER_SITE_OTHER): Payer: BLUE CROSS/BLUE SHIELD | Admitting: *Deleted

## 2015-09-04 DIAGNOSIS — Z954 Presence of other heart-valve replacement: Secondary | ICD-10-CM | POA: Diagnosis not present

## 2015-09-04 DIAGNOSIS — Z5181 Encounter for therapeutic drug level monitoring: Secondary | ICD-10-CM

## 2015-09-04 DIAGNOSIS — Z952 Presence of prosthetic heart valve: Secondary | ICD-10-CM

## 2015-09-04 DIAGNOSIS — Z7901 Long term (current) use of anticoagulants: Secondary | ICD-10-CM

## 2015-09-04 LAB — POCT INR: INR: 2.4

## 2015-10-09 ENCOUNTER — Ambulatory Visit (INDEPENDENT_AMBULATORY_CARE_PROVIDER_SITE_OTHER): Payer: BLUE CROSS/BLUE SHIELD | Admitting: *Deleted

## 2015-10-09 DIAGNOSIS — Z954 Presence of other heart-valve replacement: Secondary | ICD-10-CM

## 2015-10-09 DIAGNOSIS — Z5181 Encounter for therapeutic drug level monitoring: Secondary | ICD-10-CM

## 2015-10-09 DIAGNOSIS — Z7901 Long term (current) use of anticoagulants: Secondary | ICD-10-CM

## 2015-10-09 DIAGNOSIS — Z952 Presence of prosthetic heart valve: Secondary | ICD-10-CM

## 2015-10-09 LAB — POCT INR: INR: 2.1

## 2015-11-20 ENCOUNTER — Ambulatory Visit (INDEPENDENT_AMBULATORY_CARE_PROVIDER_SITE_OTHER): Payer: BLUE CROSS/BLUE SHIELD | Admitting: *Deleted

## 2015-11-20 DIAGNOSIS — Z5181 Encounter for therapeutic drug level monitoring: Secondary | ICD-10-CM | POA: Diagnosis not present

## 2015-11-20 DIAGNOSIS — Z954 Presence of other heart-valve replacement: Secondary | ICD-10-CM

## 2015-11-20 DIAGNOSIS — Z7901 Long term (current) use of anticoagulants: Secondary | ICD-10-CM

## 2015-11-20 DIAGNOSIS — Z952 Presence of prosthetic heart valve: Secondary | ICD-10-CM

## 2015-11-20 LAB — POCT INR: INR: 1.9

## 2015-12-11 ENCOUNTER — Ambulatory Visit (INDEPENDENT_AMBULATORY_CARE_PROVIDER_SITE_OTHER): Payer: BLUE CROSS/BLUE SHIELD | Admitting: *Deleted

## 2015-12-11 DIAGNOSIS — Z7901 Long term (current) use of anticoagulants: Secondary | ICD-10-CM

## 2015-12-11 DIAGNOSIS — Z5181 Encounter for therapeutic drug level monitoring: Secondary | ICD-10-CM

## 2015-12-11 DIAGNOSIS — Z952 Presence of prosthetic heart valve: Secondary | ICD-10-CM

## 2015-12-11 DIAGNOSIS — Z954 Presence of other heart-valve replacement: Secondary | ICD-10-CM

## 2015-12-11 LAB — POCT INR: INR: 2.1

## 2016-01-08 ENCOUNTER — Ambulatory Visit (INDEPENDENT_AMBULATORY_CARE_PROVIDER_SITE_OTHER): Payer: BLUE CROSS/BLUE SHIELD | Admitting: *Deleted

## 2016-01-08 DIAGNOSIS — Z952 Presence of prosthetic heart valve: Secondary | ICD-10-CM

## 2016-01-08 DIAGNOSIS — Z5181 Encounter for therapeutic drug level monitoring: Secondary | ICD-10-CM | POA: Diagnosis not present

## 2016-01-08 DIAGNOSIS — Z954 Presence of other heart-valve replacement: Secondary | ICD-10-CM

## 2016-01-08 DIAGNOSIS — Z7901 Long term (current) use of anticoagulants: Secondary | ICD-10-CM | POA: Diagnosis not present

## 2016-01-08 LAB — POCT INR: INR: 1.5

## 2016-01-17 ENCOUNTER — Ambulatory Visit (INDEPENDENT_AMBULATORY_CARE_PROVIDER_SITE_OTHER): Payer: BLUE CROSS/BLUE SHIELD | Admitting: *Deleted

## 2016-01-17 DIAGNOSIS — Z5181 Encounter for therapeutic drug level monitoring: Secondary | ICD-10-CM

## 2016-01-17 DIAGNOSIS — Z954 Presence of other heart-valve replacement: Secondary | ICD-10-CM | POA: Diagnosis not present

## 2016-01-17 DIAGNOSIS — Z7901 Long term (current) use of anticoagulants: Secondary | ICD-10-CM

## 2016-01-17 DIAGNOSIS — Z952 Presence of prosthetic heart valve: Secondary | ICD-10-CM

## 2016-01-17 LAB — POCT INR: INR: 2.8

## 2016-02-07 ENCOUNTER — Ambulatory Visit (INDEPENDENT_AMBULATORY_CARE_PROVIDER_SITE_OTHER): Payer: BLUE CROSS/BLUE SHIELD | Admitting: *Deleted

## 2016-02-07 DIAGNOSIS — Z5181 Encounter for therapeutic drug level monitoring: Secondary | ICD-10-CM

## 2016-02-07 DIAGNOSIS — Z952 Presence of prosthetic heart valve: Secondary | ICD-10-CM

## 2016-02-07 DIAGNOSIS — Z7901 Long term (current) use of anticoagulants: Secondary | ICD-10-CM

## 2016-02-07 DIAGNOSIS — Z954 Presence of other heart-valve replacement: Secondary | ICD-10-CM

## 2016-02-07 LAB — POCT INR: INR: 2.8

## 2016-03-06 ENCOUNTER — Ambulatory Visit (INDEPENDENT_AMBULATORY_CARE_PROVIDER_SITE_OTHER): Payer: BLUE CROSS/BLUE SHIELD | Admitting: *Deleted

## 2016-03-06 DIAGNOSIS — Z7901 Long term (current) use of anticoagulants: Secondary | ICD-10-CM | POA: Diagnosis not present

## 2016-03-06 DIAGNOSIS — Z5181 Encounter for therapeutic drug level monitoring: Secondary | ICD-10-CM

## 2016-03-06 DIAGNOSIS — Z952 Presence of prosthetic heart valve: Secondary | ICD-10-CM | POA: Diagnosis not present

## 2016-03-06 DIAGNOSIS — Z954 Presence of other heart-valve replacement: Secondary | ICD-10-CM

## 2016-03-06 LAB — POCT INR: INR: 4.2

## 2016-03-21 ENCOUNTER — Encounter: Payer: Self-pay | Admitting: *Deleted

## 2016-03-25 ENCOUNTER — Encounter: Payer: Self-pay | Admitting: Cardiovascular Disease

## 2016-03-25 ENCOUNTER — Ambulatory Visit (INDEPENDENT_AMBULATORY_CARE_PROVIDER_SITE_OTHER): Payer: BLUE CROSS/BLUE SHIELD | Admitting: Cardiovascular Disease

## 2016-03-25 ENCOUNTER — Ambulatory Visit (INDEPENDENT_AMBULATORY_CARE_PROVIDER_SITE_OTHER): Payer: BLUE CROSS/BLUE SHIELD | Admitting: *Deleted

## 2016-03-25 VITALS — BP 132/68 | HR 99 | Ht 63.0 in | Wt 115.0 lb

## 2016-03-25 DIAGNOSIS — I10 Essential (primary) hypertension: Secondary | ICD-10-CM | POA: Diagnosis not present

## 2016-03-25 DIAGNOSIS — Z954 Presence of other heart-valve replacement: Secondary | ICD-10-CM

## 2016-03-25 DIAGNOSIS — Z952 Presence of prosthetic heart valve: Secondary | ICD-10-CM

## 2016-03-25 DIAGNOSIS — R002 Palpitations: Secondary | ICD-10-CM | POA: Diagnosis not present

## 2016-03-25 DIAGNOSIS — Z7901 Long term (current) use of anticoagulants: Secondary | ICD-10-CM

## 2016-03-25 DIAGNOSIS — I6523 Occlusion and stenosis of bilateral carotid arteries: Secondary | ICD-10-CM | POA: Diagnosis not present

## 2016-03-25 DIAGNOSIS — Z5181 Encounter for therapeutic drug level monitoring: Secondary | ICD-10-CM

## 2016-03-25 LAB — POCT INR: INR: 3

## 2016-03-25 NOTE — Patient Instructions (Signed)

## 2016-03-25 NOTE — Progress Notes (Signed)
SUBJECTIVE: Pt presents for routine follow up of AVR. Stays active gardening with her husband, canned 140 cans of produce recently. No SOB or leg swelling.   Review of Systems: As per "subjective", otherwise negative.  Allergies  Allergen Reactions  . Codeine     Nausea     Current Outpatient Prescriptions  Medication Sig Dispense Refill  . aspirin 81 MG tablet Take 81 mg by mouth daily.    . pantoprazole (PROTONIX) 40 MG tablet Take 40 mg by mouth daily.    . Sennosides 8.6 MG CAPS Take by mouth.    . warfarin (COUMADIN) 2.5 MG tablet Take 2.5 mg by mouth daily.     No current facility-administered medications for this visit.     Past Medical History:  Diagnosis Date  . Acute bronchitis   . Anemia, unspecified   . Aortic stenosis    previous cardiologisit: Dr. Remigio Eisenmenger in [place], [place]  . Carotid stenosis 05/13   mild less than 50% bilaterally  . Chest pain   . COPD (chronic obstructive pulmonary disease) ([place])   . Fibromyalgia   . GERD (gastroesophageal reflux disease)   . Murmur   . Tobacco abuse     Past Surgical History:  Procedure Laterality Date  . ABDOMINAL HYSTERECTOMY    . AORTIC VALVE REPLACEMENT  11/2011   in [place]. St. Jude 19 mm  . COLONOSCOPY    . KNEE ARTHROSCOPY WITH LATERAL MENISECTOMY Left 08/16/2014   Procedure: LEFT KNEE ARTHROSCOPY WITH LATERAL MENISECTOMY;  Surgeon: Lorn Junes, MD;  Location: [facility];  Service: Orthopedics;  Laterality: Left;  . KNEE ARTHROSCOPY WITH MEDIAL MENISECTOMY Left 08/16/2014   Procedure: KNEE ARTHROSCOPY WITH MEDIAL MENISECTOMY;  Surgeon: Lorn Junes, MD;  Location: [facility];  Service: Orthopedics;  Laterality: Left;  . TONSILLECTOMY    . UPPER GI ENDOSCOPY      Social History   Social History  . Marital status: Single    Spouse name: N/A  . Number of children: N/A  . Years of education: N/A   Occupational History  . Not on file.   Social History  Main Topics  . Smoking status: Current Every Day Smoker    Packs/day: 0.50    Years: 42.00    Types: Cigarettes    Start date: 07/14/1969  . Smokeless tobacco: Never Used  . Alcohol use No  . Drug use: No  . Sexual activity: Not on file   Other Topics Concern  . Not on file   Social History Narrative  . No narrative on file     Vitals:   03/25/16 0900  BP: 132/68  Pulse: 99  SpO2: 96%  Weight: 115 lb (52.2 kg)  Height: '5\' 3"'$  (1.6 m)    PHYSICAL EXAM General: NAD  Neck: No JVD, no thyromegaly or thyroid nodule.  Lungs: Clear to auscultation bilaterally with normal respiratory effort.  CV: Nondisplaced PMI. Heart regular H2/C9 click, no O7/S9, I/VI holosystolic murmur along left sternal border. No peripheral edema. Abdomen: Soft, nontender, no hepatosplenomegaly, no distention.  Neurologic: Alert and oriented x 3.  Psych: Normal affect.  Skin: Normal.   ECG: Most recent ECG reviewed.      ASSESSMENT AND PLAN: 1. S/p AVR: Symptomatically stable since undergoing aortic valve replacement with a mechanical valve in [place]. Continue anticoagulation with warfarin. She had no significant coronary artery disease on preoperative cardiac cath. She is enrolled in our anticoagulation clinic.  2. Carotid artery stenosis: This was mild and bilateral. Continue ASA 81 mg.  3. Essential HTN: Controlled on present therapy. No changes.  4. Palpitations: Stable. Possible PVC's with compensatory pauses based upon her description in the past. If they become more frequent or she becomes more symptomatic, I would consider cardiac monitoring.  Dispo: f/u 1 year.  Kate Sable, M.D., F.A.C.C.

## 2016-04-01 ENCOUNTER — Telehealth: Payer: Self-pay | Admitting: *Deleted

## 2016-04-01 NOTE — Telephone Encounter (Signed)
Mrs. Marquart called asking to speak with Edrick Oh, RN  Please call [phone]

## 2016-04-01 NOTE — Telephone Encounter (Signed)
Pt is having trouble with pain and swelling in her knee.  Has had to take ibuprofen 2 tablets twice daily for several days.  Wants to know what to do about coumadin.  She has an appt with PCP Thursday 9/21.  Told pt to decrease coumadin to 2.'5mg'$  daily except 1.'25mg'$  on Tuesdays and Fridays.  She will get INR checked at PCP on Thursday and call me with results and treatment plan for knee.

## 2016-04-03 ENCOUNTER — Ambulatory Visit (INDEPENDENT_AMBULATORY_CARE_PROVIDER_SITE_OTHER): Payer: BLUE CROSS/BLUE SHIELD | Admitting: *Deleted

## 2016-04-03 DIAGNOSIS — Z954 Presence of other heart-valve replacement: Secondary | ICD-10-CM

## 2016-04-03 DIAGNOSIS — Z5181 Encounter for therapeutic drug level monitoring: Secondary | ICD-10-CM

## 2016-04-03 DIAGNOSIS — Z952 Presence of prosthetic heart valve: Secondary | ICD-10-CM

## 2016-04-03 LAB — POCT INR: INR: 1.9

## 2016-04-10 ENCOUNTER — Telehealth: Payer: Self-pay | Admitting: Cardiovascular Disease

## 2016-04-10 NOTE — Telephone Encounter (Signed)
Yes pt needs to keep scheduled follow-up with [facility] on Tuesday to make sure INR stable off of Prednisone.  Please call pt and advise.  Thanks

## 2016-04-10 NOTE — Telephone Encounter (Signed)
Message relayed to pt

## 2016-04-10 NOTE — Telephone Encounter (Signed)
Judy Sawyer called the office in regards to her upcoming coumdin appointment. States that her PCP took her off of the Predisone today.  States that PCP checked her INR and it was 2.4.  Wants to know if she needs to keep appointment for next Tuesday.

## 2016-04-15 ENCOUNTER — Ambulatory Visit (INDEPENDENT_AMBULATORY_CARE_PROVIDER_SITE_OTHER): Payer: BLUE CROSS/BLUE SHIELD | Admitting: *Deleted

## 2016-04-15 DIAGNOSIS — Z5181 Encounter for therapeutic drug level monitoring: Secondary | ICD-10-CM | POA: Diagnosis not present

## 2016-04-15 DIAGNOSIS — Z952 Presence of prosthetic heart valve: Secondary | ICD-10-CM | POA: Diagnosis not present

## 2016-04-15 DIAGNOSIS — Z7901 Long term (current) use of anticoagulants: Secondary | ICD-10-CM

## 2016-04-15 LAB — POCT INR: INR: 2.8

## 2016-04-29 ENCOUNTER — Ambulatory Visit (INDEPENDENT_AMBULATORY_CARE_PROVIDER_SITE_OTHER): Payer: BLUE CROSS/BLUE SHIELD | Admitting: *Deleted

## 2016-04-29 ENCOUNTER — Telehealth: Payer: Self-pay | Admitting: *Deleted

## 2016-04-29 DIAGNOSIS — Z7901 Long term (current) use of anticoagulants: Secondary | ICD-10-CM

## 2016-04-29 DIAGNOSIS — Z5181 Encounter for therapeutic drug level monitoring: Secondary | ICD-10-CM | POA: Diagnosis not present

## 2016-04-29 DIAGNOSIS — Z952 Presence of prosthetic heart valve: Secondary | ICD-10-CM | POA: Diagnosis not present

## 2016-04-29 LAB — POCT INR: INR: 2.5

## 2016-04-29 NOTE — Telephone Encounter (Addendum)
Patient has pending right knee scope.  Clearance form initially sent over on 04/25/2016.  Form was signed by Dr. Bronson Ing with bridging not needed written in, but NOT CLEARED was checked.     Please confirm that she is clear & okay to stop coumadin without bridging.

## 2016-04-30 NOTE — Telephone Encounter (Signed)
Noted, will fax back to [employer].

## 2016-04-30 NOTE — Telephone Encounter (Signed)
Cleared, no bridging required.

## 2016-05-02 ENCOUNTER — Telehealth: Payer: Self-pay | Admitting: Pharmacist

## 2016-05-02 NOTE — Telephone Encounter (Signed)
Patient called because Judy Sawyer is out of office today.   She reports that her knee procedure was scheduled for Wednesday 10/25. She was told to hold her coumadin for 5 days prior and wanted to be sure that she should not take her dose this evening. Informed her that was correct.   Of note she has a mechanical AVR, but note from Dr. Bronson Ing on 04/29/16 states no bridging needed.   Patient questions if we need to do lovenox after procedure. She has done this in the past. Told her that if we did not bridge before our protocol does not require bridging after. She states she believed that she would need it post - procedure. Will forward to Judy Sawyer and Dr. Bronson Ing for verification.

## 2016-05-05 NOTE — Telephone Encounter (Signed)
Spoke with pt and explained to her that from a cardiac standpoint she does not need lovenox bridge after surgery.  If Dr Laureen Abrahams protocol is to give surgical pt's lovenox post surgery this is fine and she would do lovenox and coumadin until INR is back above 2.0. She will call and discuss with his nurse tomorrow.

## 2016-05-13 ENCOUNTER — Ambulatory Visit (INDEPENDENT_AMBULATORY_CARE_PROVIDER_SITE_OTHER): Payer: BLUE CROSS/BLUE SHIELD | Admitting: *Deleted

## 2016-05-13 DIAGNOSIS — Z7901 Long term (current) use of anticoagulants: Secondary | ICD-10-CM

## 2016-05-13 DIAGNOSIS — Z5181 Encounter for therapeutic drug level monitoring: Secondary | ICD-10-CM

## 2016-05-13 DIAGNOSIS — Z952 Presence of prosthetic heart valve: Secondary | ICD-10-CM

## 2016-05-13 LAB — POCT INR: INR: 2

## 2016-05-27 ENCOUNTER — Ambulatory Visit (INDEPENDENT_AMBULATORY_CARE_PROVIDER_SITE_OTHER): Payer: BLUE CROSS/BLUE SHIELD | Admitting: *Deleted

## 2016-05-27 DIAGNOSIS — Z7901 Long term (current) use of anticoagulants: Secondary | ICD-10-CM | POA: Diagnosis not present

## 2016-05-27 DIAGNOSIS — Z5181 Encounter for therapeutic drug level monitoring: Secondary | ICD-10-CM

## 2016-05-27 DIAGNOSIS — Z952 Presence of prosthetic heart valve: Secondary | ICD-10-CM

## 2016-05-27 LAB — POCT INR: INR: 2.6

## 2016-06-24 ENCOUNTER — Ambulatory Visit (INDEPENDENT_AMBULATORY_CARE_PROVIDER_SITE_OTHER): Payer: BLUE CROSS/BLUE SHIELD | Admitting: *Deleted

## 2016-06-24 DIAGNOSIS — Z7901 Long term (current) use of anticoagulants: Secondary | ICD-10-CM | POA: Diagnosis not present

## 2016-06-24 DIAGNOSIS — Z5181 Encounter for therapeutic drug level monitoring: Secondary | ICD-10-CM

## 2016-06-24 DIAGNOSIS — Z952 Presence of prosthetic heart valve: Secondary | ICD-10-CM | POA: Diagnosis not present

## 2016-06-24 LAB — POCT INR: INR: 3.8

## 2016-07-15 ENCOUNTER — Ambulatory Visit (INDEPENDENT_AMBULATORY_CARE_PROVIDER_SITE_OTHER): Payer: BLUE CROSS/BLUE SHIELD | Admitting: *Deleted

## 2016-07-15 DIAGNOSIS — Z952 Presence of prosthetic heart valve: Secondary | ICD-10-CM

## 2016-07-15 DIAGNOSIS — Z5181 Encounter for therapeutic drug level monitoring: Secondary | ICD-10-CM | POA: Diagnosis not present

## 2016-07-15 DIAGNOSIS — Z7901 Long term (current) use of anticoagulants: Secondary | ICD-10-CM | POA: Diagnosis not present

## 2016-07-15 LAB — POCT INR: INR: 3.1

## 2016-08-05 ENCOUNTER — Ambulatory Visit (INDEPENDENT_AMBULATORY_CARE_PROVIDER_SITE_OTHER): Payer: BLUE CROSS/BLUE SHIELD | Admitting: *Deleted

## 2016-08-05 DIAGNOSIS — Z952 Presence of prosthetic heart valve: Secondary | ICD-10-CM

## 2016-08-05 DIAGNOSIS — Z5181 Encounter for therapeutic drug level monitoring: Secondary | ICD-10-CM | POA: Diagnosis not present

## 2016-08-05 DIAGNOSIS — Z7901 Long term (current) use of anticoagulants: Secondary | ICD-10-CM | POA: Diagnosis not present

## 2016-08-05 LAB — POCT INR: INR: 1.8

## 2016-08-26 ENCOUNTER — Ambulatory Visit (INDEPENDENT_AMBULATORY_CARE_PROVIDER_SITE_OTHER): Payer: BLUE CROSS/BLUE SHIELD | Admitting: *Deleted

## 2016-08-26 DIAGNOSIS — Z5181 Encounter for therapeutic drug level monitoring: Secondary | ICD-10-CM

## 2016-08-26 DIAGNOSIS — Z952 Presence of prosthetic heart valve: Secondary | ICD-10-CM | POA: Diagnosis not present

## 2016-08-26 DIAGNOSIS — Z7901 Long term (current) use of anticoagulants: Secondary | ICD-10-CM

## 2016-08-26 LAB — POCT INR: INR: 1.8

## 2016-09-09 ENCOUNTER — Ambulatory Visit (INDEPENDENT_AMBULATORY_CARE_PROVIDER_SITE_OTHER): Payer: BLUE CROSS/BLUE SHIELD | Admitting: *Deleted

## 2016-09-09 DIAGNOSIS — Z952 Presence of prosthetic heart valve: Secondary | ICD-10-CM

## 2016-09-09 DIAGNOSIS — Z5181 Encounter for therapeutic drug level monitoring: Secondary | ICD-10-CM | POA: Diagnosis not present

## 2016-09-09 LAB — POCT INR: INR: 1.4

## 2016-09-16 ENCOUNTER — Ambulatory Visit (INDEPENDENT_AMBULATORY_CARE_PROVIDER_SITE_OTHER): Payer: BLUE CROSS/BLUE SHIELD | Admitting: *Deleted

## 2016-09-16 DIAGNOSIS — Z5181 Encounter for therapeutic drug level monitoring: Secondary | ICD-10-CM

## 2016-09-16 DIAGNOSIS — Z952 Presence of prosthetic heart valve: Secondary | ICD-10-CM | POA: Diagnosis not present

## 2016-09-16 LAB — POCT INR: INR: 2.8

## 2016-09-30 ENCOUNTER — Ambulatory Visit (INDEPENDENT_AMBULATORY_CARE_PROVIDER_SITE_OTHER): Payer: BLUE CROSS/BLUE SHIELD | Admitting: *Deleted

## 2016-09-30 DIAGNOSIS — Z5181 Encounter for therapeutic drug level monitoring: Secondary | ICD-10-CM

## 2016-09-30 DIAGNOSIS — Z952 Presence of prosthetic heart valve: Secondary | ICD-10-CM

## 2016-09-30 LAB — POCT INR: INR: 2.1

## 2016-10-01 ENCOUNTER — Telehealth: Payer: Self-pay | Admitting: Cardiovascular Disease

## 2016-10-01 NOTE — Telephone Encounter (Signed)
pateint walked into clinic inquiring about what she should be paying for her coumadin visits?  Her co-pay is $40 for specialist for [place]. Please contact patient

## 2016-10-02 NOTE — Telephone Encounter (Signed)
Returned patient's call regarding coumadin copay.  Advised that (after checking tx inquiry) it is $44.85.

## 2016-10-09 ENCOUNTER — Telehealth: Payer: Self-pay | Admitting: Cardiovascular Disease

## 2016-10-09 NOTE — Telephone Encounter (Signed)
Saw MD today for bronchitis.  Was given a depo '80mg'$  shot IM  And Rocephin IM.  Was given a Rx for Cefixime that does not interfere with coumadin.  Told pt to eat greens x 2 days and continue on current dose of coumadin.  She verbalized understanding.

## 2016-10-09 NOTE — Telephone Encounter (Signed)
Judy Sawyer was put on antibiotics and a steroid shot today . Wants to know if this will interfere  With her coumdin.

## 2016-10-28 ENCOUNTER — Ambulatory Visit (INDEPENDENT_AMBULATORY_CARE_PROVIDER_SITE_OTHER): Payer: BLUE CROSS/BLUE SHIELD | Admitting: *Deleted

## 2016-10-28 DIAGNOSIS — Z952 Presence of prosthetic heart valve: Secondary | ICD-10-CM | POA: Diagnosis not present

## 2016-10-28 DIAGNOSIS — Z5181 Encounter for therapeutic drug level monitoring: Secondary | ICD-10-CM | POA: Diagnosis not present

## 2016-10-28 LAB — POCT INR: INR: 1.7

## 2016-11-11 ENCOUNTER — Ambulatory Visit (INDEPENDENT_AMBULATORY_CARE_PROVIDER_SITE_OTHER): Payer: BLUE CROSS/BLUE SHIELD | Admitting: *Deleted

## 2016-11-11 DIAGNOSIS — Z5181 Encounter for therapeutic drug level monitoring: Secondary | ICD-10-CM | POA: Diagnosis not present

## 2016-11-11 DIAGNOSIS — Z952 Presence of prosthetic heart valve: Secondary | ICD-10-CM | POA: Diagnosis not present

## 2016-11-11 LAB — POCT INR: INR: 3.8

## 2016-11-27 ENCOUNTER — Ambulatory Visit (INDEPENDENT_AMBULATORY_CARE_PROVIDER_SITE_OTHER): Payer: BLUE CROSS/BLUE SHIELD | Admitting: *Deleted

## 2016-11-27 DIAGNOSIS — Z952 Presence of prosthetic heart valve: Secondary | ICD-10-CM | POA: Diagnosis not present

## 2016-11-27 DIAGNOSIS — Z5181 Encounter for therapeutic drug level monitoring: Secondary | ICD-10-CM | POA: Diagnosis not present

## 2016-11-27 LAB — POCT INR: INR: 2.6

## 2016-12-18 ENCOUNTER — Ambulatory Visit (INDEPENDENT_AMBULATORY_CARE_PROVIDER_SITE_OTHER): Payer: BLUE CROSS/BLUE SHIELD | Admitting: *Deleted

## 2016-12-18 DIAGNOSIS — Z952 Presence of prosthetic heart valve: Secondary | ICD-10-CM | POA: Diagnosis not present

## 2016-12-18 DIAGNOSIS — Z5181 Encounter for therapeutic drug level monitoring: Secondary | ICD-10-CM

## 2016-12-18 LAB — POCT INR: INR: 2.9

## 2017-01-08 ENCOUNTER — Ambulatory Visit (INDEPENDENT_AMBULATORY_CARE_PROVIDER_SITE_OTHER): Payer: BLUE CROSS/BLUE SHIELD | Admitting: *Deleted

## 2017-01-08 DIAGNOSIS — Z5181 Encounter for therapeutic drug level monitoring: Secondary | ICD-10-CM | POA: Diagnosis not present

## 2017-01-08 DIAGNOSIS — Z952 Presence of prosthetic heart valve: Secondary | ICD-10-CM | POA: Diagnosis not present

## 2017-01-08 LAB — POCT INR: INR: 3.8

## 2017-01-29 ENCOUNTER — Ambulatory Visit (INDEPENDENT_AMBULATORY_CARE_PROVIDER_SITE_OTHER): Payer: BLUE CROSS/BLUE SHIELD | Admitting: *Deleted

## 2017-01-29 DIAGNOSIS — Z5181 Encounter for therapeutic drug level monitoring: Secondary | ICD-10-CM

## 2017-01-29 DIAGNOSIS — Z952 Presence of prosthetic heart valve: Secondary | ICD-10-CM

## 2017-01-29 LAB — POCT INR: INR: 2.8

## 2017-02-10 ENCOUNTER — Telehealth: Payer: Self-pay | Admitting: *Deleted

## 2017-02-10 NOTE — Telephone Encounter (Signed)
Pt has been on Augmentin since 7/23 for URI.  Will finish in the morning.  Told pt to hold coumadin tonight and come for INR check on 02/12/17.  She verbalized understanding.

## 2017-02-10 NOTE — Telephone Encounter (Signed)
Patient called stating that she has been on antibiotics 02-02-17. Needs to discuss how to manage coumdin. Please call [phone]

## 2017-02-12 ENCOUNTER — Ambulatory Visit (INDEPENDENT_AMBULATORY_CARE_PROVIDER_SITE_OTHER): Payer: BLUE CROSS/BLUE SHIELD | Admitting: *Deleted

## 2017-02-12 DIAGNOSIS — Z952 Presence of prosthetic heart valve: Secondary | ICD-10-CM | POA: Diagnosis not present

## 2017-02-12 DIAGNOSIS — Z5181 Encounter for therapeutic drug level monitoring: Secondary | ICD-10-CM

## 2017-02-12 LAB — POCT INR: INR: 1.6

## 2017-02-13 ENCOUNTER — Telehealth: Payer: Self-pay | Admitting: Cardiovascular Disease

## 2017-02-13 NOTE — Telephone Encounter (Signed)
Returned call to pt, pt has been given a Depo-Medrol shot and Rochephin injection in office 02/12/17 and started on Ceftin 250mg  BID.  Advised pt these medications do not typically interact with Coumadin.  Pt saw Edrick Oh, RN in office 02/12/17 INR 1.6, advised pt to continue on Coumadin dosage as instructed by Lattie Haw at [facility], and keep scheduled follow-up on 02/24/17 as scheduled.  Call back with any additional med changes or upset stomach from abxs occurs.  Pt verbalized understanding.

## 2017-02-13 NOTE — Telephone Encounter (Signed)
Patient went to see her PCP 02/12/17 for Upper Respiratory Infection.  Patient was given antibiotics . She was Told to contact our office due to her coumdin.  Please call [phone].

## 2017-02-24 ENCOUNTER — Ambulatory Visit (INDEPENDENT_AMBULATORY_CARE_PROVIDER_SITE_OTHER): Payer: BLUE CROSS/BLUE SHIELD | Admitting: *Deleted

## 2017-02-24 DIAGNOSIS — Z952 Presence of prosthetic heart valve: Secondary | ICD-10-CM | POA: Diagnosis not present

## 2017-02-24 DIAGNOSIS — Z5181 Encounter for therapeutic drug level monitoring: Secondary | ICD-10-CM

## 2017-02-24 LAB — POCT INR: INR: 3.6

## 2017-03-10 ENCOUNTER — Ambulatory Visit (INDEPENDENT_AMBULATORY_CARE_PROVIDER_SITE_OTHER): Payer: BLUE CROSS/BLUE SHIELD | Admitting: *Deleted

## 2017-03-10 DIAGNOSIS — Z952 Presence of prosthetic heart valve: Secondary | ICD-10-CM | POA: Diagnosis not present

## 2017-03-10 DIAGNOSIS — Z5181 Encounter for therapeutic drug level monitoring: Secondary | ICD-10-CM

## 2017-03-10 LAB — POCT INR: INR: 5.5

## 2017-03-17 ENCOUNTER — Ambulatory Visit (INDEPENDENT_AMBULATORY_CARE_PROVIDER_SITE_OTHER): Payer: BLUE CROSS/BLUE SHIELD | Admitting: *Deleted

## 2017-03-17 DIAGNOSIS — Z952 Presence of prosthetic heart valve: Secondary | ICD-10-CM | POA: Diagnosis not present

## 2017-03-17 DIAGNOSIS — Z5181 Encounter for therapeutic drug level monitoring: Secondary | ICD-10-CM

## 2017-03-17 LAB — POCT INR: INR: 2

## 2017-03-31 ENCOUNTER — Ambulatory Visit (INDEPENDENT_AMBULATORY_CARE_PROVIDER_SITE_OTHER): Payer: BLUE CROSS/BLUE SHIELD | Admitting: *Deleted

## 2017-03-31 DIAGNOSIS — Z5181 Encounter for therapeutic drug level monitoring: Secondary | ICD-10-CM

## 2017-03-31 DIAGNOSIS — Z952 Presence of prosthetic heart valve: Secondary | ICD-10-CM | POA: Diagnosis not present

## 2017-03-31 LAB — POCT INR: INR: 3

## 2017-04-03 ENCOUNTER — Encounter: Payer: Self-pay | Admitting: Cardiovascular Disease

## 2017-04-03 ENCOUNTER — Encounter: Payer: Self-pay | Admitting: *Deleted

## 2017-04-03 ENCOUNTER — Ambulatory Visit (INDEPENDENT_AMBULATORY_CARE_PROVIDER_SITE_OTHER): Payer: BLUE CROSS/BLUE SHIELD | Admitting: Cardiovascular Disease

## 2017-04-03 ENCOUNTER — Telehealth: Payer: Self-pay | Admitting: Cardiovascular Disease

## 2017-04-03 VITALS — BP 120/64 | HR 111 | Ht 63.0 in | Wt 105.0 lb

## 2017-04-03 DIAGNOSIS — I6529 Occlusion and stenosis of unspecified carotid artery: Secondary | ICD-10-CM

## 2017-04-03 DIAGNOSIS — M542 Cervicalgia: Secondary | ICD-10-CM | POA: Diagnosis not present

## 2017-04-03 DIAGNOSIS — Z952 Presence of prosthetic heart valve: Secondary | ICD-10-CM | POA: Diagnosis not present

## 2017-04-03 DIAGNOSIS — R946 Abnormal results of thyroid function studies: Secondary | ICD-10-CM

## 2017-04-03 DIAGNOSIS — R000 Tachycardia, unspecified: Secondary | ICD-10-CM | POA: Diagnosis not present

## 2017-04-03 DIAGNOSIS — I779 Disorder of arteries and arterioles, unspecified: Secondary | ICD-10-CM

## 2017-04-03 DIAGNOSIS — R7989 Other specified abnormal findings of blood chemistry: Secondary | ICD-10-CM

## 2017-04-03 DIAGNOSIS — I10 Essential (primary) hypertension: Secondary | ICD-10-CM | POA: Diagnosis not present

## 2017-04-03 DIAGNOSIS — I739 Peripheral vascular disease, unspecified: Secondary | ICD-10-CM

## 2017-04-03 NOTE — Addendum Note (Signed)
Addended by: Julian Hy T on: 04/03/2017 05:04 PM   Modules accepted: Orders

## 2017-04-03 NOTE — Progress Notes (Signed)
SUBJECTIVE: The patient presents for routine follow-up. She has a history of aortic valve replacement in May 2013.  For the past 3 months, she has had fluctuating INR levels. It was 5.5 on 03/10/17. She tells me she was also diagnosed with anemia. She is now on iron supplements. When she bends down at work and if she stands up too quickly she becomes very dizzy. She tells me that her white blood cells and platelets were recently elevated. She also tells me that thyroid function blood tests were done and were also found to be abnormal.  She tells me that one sister had an underactive thyroid with Hashimoto's thyroiditis and another sister required radioactive iodine treatment.  I have asked my nursing staff to obtain copies of most recent notes and blood tests from PCP.  ECG performed in the office today which I ordered and personally interpreted demonstrated sinus tachycardia, 109 bpm.  Review of Systems: As per "subjective", otherwise negative.  Allergies  Allergen Reactions  . Codeine     Nausea     Current Outpatient Prescriptions  Medication Sig Dispense Refill  . aspirin 81 MG tablet Take 81 mg by mouth daily.    . cefPROZIL (CEFZIL) 250 MG tablet Take 250 mg by mouth 2 (two) times daily.    . fluticasone (FLONASE) 50 MCG/ACT nasal spray Place into both nostrils as needed for allergies or rhinitis.    Marland Kitchen iron polysaccharides (NIFEREX) 150 MG capsule Take 150 mg by mouth daily.    . pantoprazole (PROTONIX) 40 MG tablet Take 40 mg by mouth daily.    . Sennosides 8.6 MG CAPS Take by mouth.    . warfarin (COUMADIN) 2.5 MG tablet Take 2.5 mg by mouth daily.     No current facility-administered medications for this visit.     Past Medical History:  Diagnosis Date  . Acute bronchitis   . Anemia, unspecified   . Aortic stenosis    previous cardiologisit: Dr. Remigio Eisenmenger in [place], [place]  . Carotid stenosis 05/13   mild less than 50% bilaterally  . Chest pain   . COPD  (chronic obstructive pulmonary disease) ([facility])   . Fibromyalgia   . GERD (gastroesophageal reflux disease)   . Murmur   . Tobacco abuse     Past Surgical History:  Procedure Laterality Date  . ABDOMINAL HYSTERECTOMY    . AORTIC VALVE REPLACEMENT  11/2011   in [place]. St. Jude 19 mm  . COLONOSCOPY    . KNEE ARTHROSCOPY WITH LATERAL MENISECTOMY Left 08/16/2014   Procedure: LEFT KNEE ARTHROSCOPY WITH LATERAL MENISECTOMY;  Surgeon: Lorn Junes, MD;  Location: [facility];  Service: Orthopedics;  Laterality: Left;  . KNEE ARTHROSCOPY WITH MEDIAL MENISECTOMY Left 08/16/2014   Procedure: KNEE ARTHROSCOPY WITH MEDIAL MENISECTOMY;  Surgeon: Lorn Junes, MD;  Location: [facility];  Service: Orthopedics;  Laterality: Left;  . TONSILLECTOMY    . UPPER GI ENDOSCOPY      Social History   Social History  . Marital status: Single    Spouse name: N/A  . Number of children: N/A  . Years of education: N/A   Occupational History  . Not on file.   Social History Main Topics  . Smoking status: Current Every Day Smoker    Packs/day: 0.50    Years: 42.00    Types: Cigarettes    Start date: 07/14/1969  . Smokeless tobacco: Never Used  . Alcohol use No  .  Drug use: No  . Sexual activity: Not on file   Other Topics Concern  . Not on file   Social History Narrative  . No narrative on file     Vitals:   04/03/17 1607  BP: 120/64  Pulse: (!) 111  SpO2: 97%  Weight: 105 lb (47.6 kg)  Height: 5\' 3"  (1.6 m)    Wt Readings from Last 3 Encounters:  04/03/17 105 lb (47.6 kg)  03/25/16 115 lb (52.2 kg)  03/16/15 115 lb (52.2 kg)     PHYSICAL EXAM General: NAD HEENT: Normal. Neck: No JVD, +thyromegaly with tenderness to palpation. Lungs: Clear to auscultation bilaterally with normal respiratory effort. CV: Nondisplaced PMI.  Tachycardic, regular rhythm, normal E0/C1 click, no K4/Y1, I/VI holosystolic murmur along left sternal border. No pretibial or  periankle edema.  No carotid bruit.   Abdomen: Soft, nontender, no distention.  Neurologic: Alert and oriented.  Psych: Normal affect. Skin: Normal. Musculoskeletal: No gross deformities.    ECG: Most recent ECG reviewed.   Labs: Lab Results  Component Value Date/Time   HGB 15.3 (H) 08/16/2014 07:34 AM     Lipids: No results found for: LDLCALC, LDLDIRECT, CHOL, TRIG, HDL     ASSESSMENT AND PLAN:  1. S/p AVR: Symptomatically stable since undergoing aortic valve replacement with a mechanical valve in [place]. Continue anticoagulation with warfarin. She has had fluctuating INR's recently. She had no significant coronary artery disease on preoperative cardiac cath. She is enrolled in our anticoagulation clinic.  I will obtain an echocardiogram to assess valve integrity.  2. Carotid artery stenosis: This was mild and bilateral in 11/2011. Continue ASA 81 mg. I will repeat Dopplers.  3. Essential HTN: Controlled on present therapy. No changes.  4. Tachycardia: She has a sinus tachycardia and tells me that recent blood tests for thyroid were abnormal. Tachycardia would suggest hyperthyroidism. She has a family history of both Hashimoto's thyroiditis and hyperthyroidism. I will obtain copies of most recent blood tests and notes from PCP.  5. Neck tenderness with abnormal thyroid function tests: She has a sinus tachycardia and tells me that recent blood tests for thyroid were abnormal. Tachycardia would suggest hyperthyroidism. She has a family history of both Hashimoto's thyroiditis and hyperthyroidism. I will obtain copies of most recent blood tests and notes from PCP. I will obtain a thyroid ultrasound given her neck tenderness. Thyroid tenderness would suggest subacute thyroiditis.    Disposition: Follow up 2 months.  Time spent: 40 minutes, of which greater than 50% was spent reviewing symptoms, relevant blood tests and studies, and discussing management plan with the  patient.    Kate Sable, M.D., F.A.C.C.

## 2017-04-03 NOTE — Patient Instructions (Signed)
Your physician recommends that you schedule a follow-up appointment in: [place]  Your physician recommends that you continue on your current medications as directed. Please refer to the Current Medication list given to you today.  Your physician has requested that you have a carotid duplex. This test is an ultrasound of the carotid arteries in your neck. It looks at blood flow through these arteries that supply the brain with blood. Allow one hour for this exam. There are no restrictions or special instructions.  Your physician has requested that you have an echocardiogram. Echocardiography is a painless test that uses sound waves to create images of your heart. It provides your doctor with information about the size and shape of your heart and how well your heart's chambers and valves are working. This procedure takes approximately one hour. There are no restrictions for this procedure.  THYROID ULTRASOUND   Thank you for choosing [facility]!!

## 2017-04-03 NOTE — Telephone Encounter (Signed)
Pre-cert Verification for the following procedure   US THYROID set for Wednesday 04-08-17 Echo/Cartoid Dopplers set for Thursday 04-09-17

## 2017-04-07 ENCOUNTER — Ambulatory Visit (INDEPENDENT_AMBULATORY_CARE_PROVIDER_SITE_OTHER): Payer: BLUE CROSS/BLUE SHIELD | Admitting: *Deleted

## 2017-04-07 DIAGNOSIS — Z952 Presence of prosthetic heart valve: Secondary | ICD-10-CM

## 2017-04-07 DIAGNOSIS — Z5181 Encounter for therapeutic drug level monitoring: Secondary | ICD-10-CM | POA: Diagnosis not present

## 2017-04-07 LAB — POCT INR: INR: 2.4

## 2017-04-08 ENCOUNTER — Ambulatory Visit (HOSPITAL_COMMUNITY)
Admission: RE | Admit: 2017-04-08 | Discharge: 2017-04-08 | Disposition: A | Discharge status: Home / Self Care | Payer: BLUE CROSS/BLUE SHIELD | Source: Ambulatory Visit | Attending: Cardiovascular Disease | Admitting: Cardiovascular Disease

## 2017-04-08 DIAGNOSIS — R590 Localized enlarged lymph nodes: Secondary | ICD-10-CM | POA: Diagnosis not present

## 2017-04-08 DIAGNOSIS — R946 Abnormal results of thyroid function studies: Secondary | ICD-10-CM | POA: Diagnosis present

## 2017-04-08 DIAGNOSIS — R7989 Other specified abnormal findings of blood chemistry: Secondary | ICD-10-CM

## 2017-04-09 ENCOUNTER — Other Ambulatory Visit: Payer: Self-pay

## 2017-04-09 ENCOUNTER — Telehealth: Payer: Self-pay | Admitting: *Deleted

## 2017-04-09 ENCOUNTER — Ambulatory Visit (INDEPENDENT_AMBULATORY_CARE_PROVIDER_SITE_OTHER): Payer: BLUE CROSS/BLUE SHIELD

## 2017-04-09 ENCOUNTER — Ambulatory Visit: Payer: BLUE CROSS/BLUE SHIELD

## 2017-04-09 DIAGNOSIS — Z952 Presence of prosthetic heart valve: Secondary | ICD-10-CM

## 2017-04-09 DIAGNOSIS — I779 Disorder of arteries and arterioles, unspecified: Secondary | ICD-10-CM

## 2017-04-09 DIAGNOSIS — I6523 Occlusion and stenosis of bilateral carotid arteries: Secondary | ICD-10-CM | POA: Diagnosis not present

## 2017-04-09 DIAGNOSIS — I739 Peripheral vascular disease, unspecified: Principal | ICD-10-CM

## 2017-04-09 NOTE — Telephone Encounter (Signed)
[facility] -  Notes recorded by Herminio Commons, MD on 04/09/2017 at 2:00 PM EDT Normal pumping function and normal function of aortic valve.  US THYROID -  Notes recorded by Herminio Commons, MD on 04/09/2017 at 9:48 AM EDT There are a few nodules and enlarged lymph nodes. TSH was only mildly elevated. Please forward this to PCP.

## 2017-04-09 NOTE — Telephone Encounter (Signed)
Notes recorded by Laurine Blazer, LPN on 6/94/8546 at 2:70 PM EDT Patient notified. Copy to pmd.

## 2017-04-10 LAB — VAS US CAROTID
LCCADDIAS: -19 cm/s
LCCADSYS: -63 cm/s
LCCAPSYS: 92 cm/s
LEFT ECA DIAS: 0 cm/s
LEFT VERTEBRAL DIAS: 19 cm/s
LICADDIAS: -32 cm/s
LICADSYS: -107 cm/s
LICAPSYS: 79 cm/s
Left CCA prox dias: 24 cm/s
Left ICA prox dias: 27 cm/s
RIGHT ECA DIAS: -13 cm/s
RIGHT VERTEBRAL DIAS: 10 cm/s
Right CCA prox dias: 19 cm/s
Right CCA prox sys: 96 cm/s
Right cca dist sys: -129 cm/s

## 2017-04-16 ENCOUNTER — Telehealth: Payer: Self-pay | Admitting: Cardiovascular Disease

## 2017-04-16 NOTE — Telephone Encounter (Signed)
Please call patient back in reference to her starting an antibiotic today

## 2017-04-16 NOTE — Telephone Encounter (Signed)
Pt had shot of rocephin today in PCP office and was started on Singular.  Last INR was 2.4.  Singular does not interfere with coumadin.  Told pt to continue current coumadin dose and keep INR appt as scheduled.

## 2017-04-17 ENCOUNTER — Telehealth: Payer: Self-pay | Admitting: *Deleted

## 2017-04-17 NOTE — Telephone Encounter (Signed)
Notes recorded by Laurine Blazer, LPN on 80/09/2120 at 4:82 PM EDT Patient notified. Copy to pmd. Follow up scheduled for 06/09/2017 with Dr. Bronson Ing. ------  Notes recorded by Laurine Blazer, LPN on 50/0/3704 at 8:88 PM EDT Left message to return call.  ------  Notes recorded by Herminio Commons, MD on 04/10/2017 at 11:40 AM EDT Normal.

## 2017-04-22 ENCOUNTER — Telehealth: Payer: Self-pay | Admitting: Cardiovascular Disease

## 2017-04-22 NOTE — Telephone Encounter (Signed)
Results released to MyChart and sent to pcp on 04/17/17 by Dr Court Joy nurse.

## 2017-04-22 NOTE — Telephone Encounter (Signed)
Patient walk in - asking about test results being released to her in [facility] and sent to her PCP Jones Broom

## 2017-04-28 ENCOUNTER — Telehealth: Payer: Self-pay | Admitting: Cardiovascular Disease

## 2017-04-28 ENCOUNTER — Ambulatory Visit (INDEPENDENT_AMBULATORY_CARE_PROVIDER_SITE_OTHER): Payer: BLUE CROSS/BLUE SHIELD | Admitting: *Deleted

## 2017-04-28 DIAGNOSIS — Z952 Presence of prosthetic heart valve: Secondary | ICD-10-CM

## 2017-04-28 DIAGNOSIS — Z5181 Encounter for therapeutic drug level monitoring: Secondary | ICD-10-CM

## 2017-04-28 LAB — POCT INR: INR: 2.8

## 2017-04-28 NOTE — Telephone Encounter (Signed)
Printed results report and faxed to pcp

## 2017-04-28 NOTE — Telephone Encounter (Signed)
Patient walk in   Stated that Denny Levy is asking for actual report of his Carotid test.  She received the fax with numbers but she doesn't know what that means.

## 2017-05-05 ENCOUNTER — Ambulatory Visit (INDEPENDENT_AMBULATORY_CARE_PROVIDER_SITE_OTHER): Payer: BLUE CROSS/BLUE SHIELD | Admitting: *Deleted

## 2017-05-05 DIAGNOSIS — Z5181 Encounter for therapeutic drug level monitoring: Secondary | ICD-10-CM

## 2017-05-05 DIAGNOSIS — Z952 Presence of prosthetic heart valve: Secondary | ICD-10-CM | POA: Diagnosis not present

## 2017-05-05 LAB — POCT INR: INR: 2.4

## 2017-05-12 ENCOUNTER — Ambulatory Visit (INDEPENDENT_AMBULATORY_CARE_PROVIDER_SITE_OTHER): Payer: BLUE CROSS/BLUE SHIELD | Admitting: *Deleted

## 2017-05-12 DIAGNOSIS — Z952 Presence of prosthetic heart valve: Secondary | ICD-10-CM | POA: Diagnosis not present

## 2017-05-12 DIAGNOSIS — Z5181 Encounter for therapeutic drug level monitoring: Secondary | ICD-10-CM

## 2017-05-12 LAB — POCT INR: INR: 2.6

## 2017-05-13 ENCOUNTER — Telehealth: Payer: Self-pay | Admitting: *Deleted

## 2017-05-13 NOTE — Telephone Encounter (Signed)
Please call patient regarding medication change / tg

## 2017-05-13 NOTE — Telephone Encounter (Signed)
Pt is starting Prednisone 20mg  bid x 5 days tonight for chest congestions/  Told pt to decrease coumadin coumadin to 1/2 tablet on Thursday, Saturday and Monday then resume regular dose.  INR appt moved up to 05/26/17.  Pt verbalized understanding.

## 2017-05-26 ENCOUNTER — Ambulatory Visit (INDEPENDENT_AMBULATORY_CARE_PROVIDER_SITE_OTHER): Payer: BLUE CROSS/BLUE SHIELD | Admitting: *Deleted

## 2017-05-26 DIAGNOSIS — Z5181 Encounter for therapeutic drug level monitoring: Secondary | ICD-10-CM

## 2017-05-26 DIAGNOSIS — Z952 Presence of prosthetic heart valve: Secondary | ICD-10-CM | POA: Diagnosis not present

## 2017-05-26 LAB — POCT INR: INR: 2.7

## 2017-06-09 ENCOUNTER — Ambulatory Visit (INDEPENDENT_AMBULATORY_CARE_PROVIDER_SITE_OTHER): Payer: BLUE CROSS/BLUE SHIELD | Admitting: Cardiovascular Disease

## 2017-06-09 ENCOUNTER — Encounter: Payer: Self-pay | Admitting: Cardiovascular Disease

## 2017-06-09 VITALS — BP 168/84 | HR 112 | Ht 63.0 in | Wt 109.0 lb

## 2017-06-09 DIAGNOSIS — R7989 Other specified abnormal findings of blood chemistry: Secondary | ICD-10-CM

## 2017-06-09 DIAGNOSIS — Z7901 Long term (current) use of anticoagulants: Secondary | ICD-10-CM

## 2017-06-09 DIAGNOSIS — R000 Tachycardia, unspecified: Secondary | ICD-10-CM

## 2017-06-09 DIAGNOSIS — Z952 Presence of prosthetic heart valve: Secondary | ICD-10-CM

## 2017-06-09 DIAGNOSIS — R0609 Other forms of dyspnea: Secondary | ICD-10-CM

## 2017-06-09 DIAGNOSIS — M542 Cervicalgia: Secondary | ICD-10-CM | POA: Diagnosis not present

## 2017-06-09 DIAGNOSIS — R002 Palpitations: Secondary | ICD-10-CM | POA: Diagnosis not present

## 2017-06-09 DIAGNOSIS — I10 Essential (primary) hypertension: Secondary | ICD-10-CM | POA: Diagnosis not present

## 2017-06-09 MED ORDER — METOPROLOL TARTRATE 25 MG PO TABS: 25.0000 mg | ORAL_TABLET | Freq: Two times a day (BID) | ORAL | 6 refills | Status: DC

## 2017-06-09 NOTE — Progress Notes (Signed)
SUBJECTIVE: Patient presents for follow-up of aortic valve replacement and tachycardia.  Thyroid ultrasound showed moderately heterogeneous tissue with a few nodules.  There were a few prominent lymph nodes on both sides of her neck.  I have forwarded copies of these results to her PCP.  Echocardiogram 04/09/17 demonstrated normal left ventricular systolic function, LVEF 03-54%, mild LVH, grade 1 diastolic dysfunction, and a normally functioning Saint Jude mechanical prosthesis in the aortic position with trivial aortic regurgitation and grossly normal function.  Carotid Dopplers were normal.  She remains tachycardic.  She continues to have neck pain and feels like she is suffocating if she turns on her side at night.  She wakes up with puffiness around both eyes.  She is also been struggling with sinusitis. She continues to experience exertional dyspnea with minimal activity.  She is scheduled to see hematologist due to fluctuating INRs.  She is also scheduled to see an endocrinologist in March but is hoping to see one in [place] sooner than that.   Review of Systems: As per "subjective", otherwise negative.  Allergies  Allergen Reactions  . Codeine     Nausea     Current Outpatient Medications  Medication Sig Dispense Refill  . aspirin 81 MG tablet Take 81 mg by mouth daily.    . fluticasone (FLONASE) 50 MCG/ACT nasal spray Place into both nostrils as needed for allergies or rhinitis.    Marland Kitchen iron polysaccharides (NIFEREX) 150 MG capsule Take 150 mg by mouth daily.    . pantoprazole (PROTONIX) 40 MG tablet Take 40 mg by mouth daily.    . Sennosides 8.6 MG CAPS Take by mouth.    . warfarin (COUMADIN) 2.5 MG tablet Take 2.5 mg by mouth daily.     No current facility-administered medications for this visit.     Past Medical History:  Diagnosis Date  . Acute bronchitis   . Anemia, unspecified   . Aortic stenosis    previous cardiologisit: Dr. Remigio Eisenmenger in [place],  [place]  . Carotid stenosis 05/13   mild less than 50% bilaterally  . Chest pain   . COPD (chronic obstructive pulmonary disease) ([place])   . Fibromyalgia   . GERD (gastroesophageal reflux disease)   . Murmur   . Tobacco abuse     Past Surgical History:  Procedure Laterality Date  . ABDOMINAL HYSTERECTOMY    . AORTIC VALVE REPLACEMENT  11/2011   in [place]. St. Jude 19 mm  . COLONOSCOPY    . KNEE ARTHROSCOPY WITH LATERAL MENISECTOMY Left 08/16/2014   Procedure: LEFT KNEE ARTHROSCOPY WITH LATERAL MENISECTOMY;  Surgeon: Lorn Junes, MD;  Location: [place] [facility];  Service: Orthopedics;  Laterality: Left;  . KNEE ARTHROSCOPY WITH MEDIAL MENISECTOMY Left 08/16/2014   Procedure: KNEE ARTHROSCOPY WITH MEDIAL MENISECTOMY;  Surgeon: Lorn Junes, MD;  Location: [place];  Service: Orthopedics;  Laterality: Left;  . TONSILLECTOMY    . UPPER GI ENDOSCOPY      Social History   Socioeconomic History  . Marital status: Single    Spouse name: Not on file  . Number of children: Not on file  . Years of education: Not on file  . Highest education level: Not on file  Social Needs  . Financial resource strain: Not on file  . Food insecurity - worry: Not on file  . Food insecurity - inability: Not on file  . Transportation needs - medical: Not on file  . Transportation needs -  non-medical: Not on file  Occupational History  . Not on file  Tobacco Use  . Smoking status: Current Every Day Smoker    Packs/day: 0.50    Years: 42.00    Pack years: 21.00    Types: Cigarettes    Start date: 07/14/1969  . Smokeless tobacco: Never Used  Substance and Sexual Activity  . Alcohol use: No    Alcohol/week: 0.0 oz  . Drug use: No  . Sexual activity: Not on file  Other Topics Concern  . Not on file  Social History Narrative  . Not on file     Vitals:   06/09/17 1448  BP: (!) 168/84  Pulse: (!) 112  SpO2: 98%  Weight: 109 lb (49.4 kg)  Height: 5\' 3"  (1.6 m)     Wt Readings from Last 3 Encounters:  06/09/17 109 lb (49.4 kg)  04/03/17 105 lb (47.6 kg)  03/25/16 115 lb (52.2 kg)     PHYSICAL EXAM General: NAD HEENT: Normal. Neck: No JVD, +thyromegaly with tenderness to palpation. Lungs: Clear to auscultation bilaterally with normal respiratory effort. CV: Tachycardic, regular rhythm, normal K4/M0 click, no N0/U7, I/VI holosystolic murmur along left sternal border. No pretibial or periankle edema.    Abdomen: Soft, nontender, no distention.  Neurologic: Alert and oriented.  Psych: Normal affect. Skin: Normal. Musculoskeletal: No gross deformities.    ECG: Most recent ECG reviewed.   Labs: Lab Results  Component Value Date/Time   HGB 15.3 (H) 08/16/2014 07:34 AM     Lipids: No results found for: LDLCALC, LDLDIRECT, CHOL, TRIG, HDL     ASSESSMENT AND PLAN:  1. S/p AVR: Symptomatically stable since undergoing aortic valve replacement with a mechanical valve in [place]. Continue anticoagulation with warfarin. She has had fluctuating INR's recently and is now scheduled to see a hematologist. She had no significant coronary artery disease on preoperative cardiac cath. She is enrolled in our anticoagulation clinic.  Echocardiogram as detailed above demonstrated normal valve function.  2. Carotid artery stenosis: This was mild and bilateral  stenosis in 11/2011.  Most recent Dopplers in September 2018 which I ordered were normal.  3. Essential HTN: Markedly elevated.  I am starting metoprolol tartrate for the treatment of tachycardia and exertional dyspnea.  She is also scheduled to see an endocrinologist for thyroid disease.  4. Tachycardia with exertional dyspnea: Tachycardia would suggest hyperthyroidism. She has a family history of both Hashimoto's thyroiditis and hyperthyroidism.  Thyroid ultrasound results detailed above.  I will start metoprolol tartrate 25 mg twice daily.  5. Neck tenderness with abnormal thyroid function  tests: She has a sinus tachycardia which would suggest hyperthyroidism. She has a family history of both Hashimoto's thyroiditis and hyperthyroidism. Thyroid tenderness would suggest subacute thyroiditis. Thyroid ultrasound results detailed above.  She is scheduled to see an endocrinologist in the near future.     Disposition: Follow up 4 months.   Kate Sable, M.D., F.A.C.C.

## 2017-06-09 NOTE — Patient Instructions (Signed)
Medication Instructions:   Begin Metoprolol 25mg  twice a day.  Continue all other medications.    Labwork: none  Testing/Procedures: none  Follow-Up: 4 months   Any Other Special Instructions Will Be Listed Below (If Applicable).  If you need a refill on your cardiac medications before your next appointment, please call your pharmacy.

## 2017-06-30 ENCOUNTER — Ambulatory Visit (INDEPENDENT_AMBULATORY_CARE_PROVIDER_SITE_OTHER): Payer: BLUE CROSS/BLUE SHIELD | Admitting: *Deleted

## 2017-06-30 ENCOUNTER — Encounter: Payer: Self-pay | Admitting: Gastroenterology

## 2017-06-30 DIAGNOSIS — Z952 Presence of prosthetic heart valve: Secondary | ICD-10-CM

## 2017-06-30 DIAGNOSIS — Z5181 Encounter for therapeutic drug level monitoring: Secondary | ICD-10-CM

## 2017-06-30 LAB — POCT INR: INR: 2.8

## 2017-07-08 ENCOUNTER — Telehealth: Payer: Self-pay | Admitting: *Deleted

## 2017-07-08 NOTE — Telephone Encounter (Signed)
Patient notified and verbalized understanding. 

## 2017-07-08 NOTE — Telephone Encounter (Signed)
[  07/08/2017 3:41 PM] Kate Sable:  Please call Juleen Starr and tell her I reviewed her CT results. She had an unremarkable coronary angiogram prior to undergoing valve replacement surgery in 2013. The "calcifications" seen are likely due to hardening on the outside of the arteries rather than blockages within the artery itself. At the present time, there are other issues within the chest causing her symptoms. If she needs to undergo a surgery, I would consider a stress test beforehand.

## 2017-07-17 ENCOUNTER — Ambulatory Visit: Payer: BLUE CROSS/BLUE SHIELD | Admitting: Gastroenterology

## 2017-07-17 ENCOUNTER — Ambulatory Visit (INDEPENDENT_AMBULATORY_CARE_PROVIDER_SITE_OTHER): Payer: BLUE CROSS/BLUE SHIELD | Admitting: *Deleted

## 2017-07-17 DIAGNOSIS — Z952 Presence of prosthetic heart valve: Secondary | ICD-10-CM

## 2017-07-17 DIAGNOSIS — Z5181 Encounter for therapeutic drug level monitoring: Secondary | ICD-10-CM

## 2017-07-17 LAB — POCT INR: INR: 2.2

## 2017-07-20 ENCOUNTER — Ambulatory Visit: Payer: Self-pay | Admitting: "Endocrinology

## 2017-07-30 ENCOUNTER — Ambulatory Visit (INDEPENDENT_AMBULATORY_CARE_PROVIDER_SITE_OTHER): Payer: BLUE CROSS/BLUE SHIELD | Admitting: *Deleted

## 2017-07-30 DIAGNOSIS — Z952 Presence of prosthetic heart valve: Secondary | ICD-10-CM | POA: Diagnosis not present

## 2017-07-30 DIAGNOSIS — Z5181 Encounter for therapeutic drug level monitoring: Secondary | ICD-10-CM | POA: Diagnosis not present

## 2017-07-30 LAB — POCT INR: INR: 1.9

## 2017-07-30 NOTE — Patient Instructions (Signed)
Coumadin was d/c on 07/28/17.  On lovenox 40mg  bid now due to cancer and upcoming treatment. Will resume management when she is started back on coumadin

## 2017-07-31 ENCOUNTER — Other Ambulatory Visit: Payer: Self-pay

## 2017-08-10 ENCOUNTER — Telehealth: Payer: Self-pay | Admitting: Cardiovascular Disease

## 2017-08-10 NOTE — Telephone Encounter (Signed)
Spoke with Dr. Anthony Sar regarding anticoagulation therapy with patient. Dr. Anthony Sar states patient has a large hematoma that needs to be cleaned out. Dr. Anthony Sar wanted to clarify with Dr. Bronson Ing which would be the safest way to stop anticoagulation for this. Both doctors in aggreeance that patient should be admitted started stop lovenox, start patient on heparin drip, stop heparin to clean out hematoma, then start patient back on IV heparin. Patient notified and going to [facility] for admittance.

## 2017-08-10 NOTE — Telephone Encounter (Signed)
Call from [place] [facility] called stating that patient had a port placed last week. By Dr. Ladona Horns . States now that she has a large hematoma under the port. On 08/04/2017.Marland Kitchen Patient is now on Lovenox 40 mg twice daily.  Dr. Anthony Sar is wanting to admit her to [facility] but needs to speak with Dr. Bronson Ing about the hematoma.. Patient has walked into the office.

## 2017-08-10 NOTE — Telephone Encounter (Signed)
Discussed with Acquanetta Chain LPN.

## 2017-10-05 ENCOUNTER — Ambulatory Visit: Payer: BLUE CROSS/BLUE SHIELD | Admitting: Cardiovascular Disease

## 2017-10-05 ENCOUNTER — Encounter: Payer: Self-pay | Admitting: Cardiovascular Disease

## 2017-10-05 VITALS — BP 100/68 | HR 110 | Ht 63.0 in | Wt 107.8 lb

## 2017-10-05 DIAGNOSIS — I10 Essential (primary) hypertension: Secondary | ICD-10-CM

## 2017-10-05 DIAGNOSIS — R000 Tachycardia, unspecified: Secondary | ICD-10-CM | POA: Diagnosis not present

## 2017-10-05 DIAGNOSIS — C3491 Malignant neoplasm of unspecified part of right bronchus or lung: Secondary | ICD-10-CM

## 2017-10-05 DIAGNOSIS — Z952 Presence of prosthetic heart valve: Secondary | ICD-10-CM | POA: Diagnosis not present

## 2017-10-05 NOTE — Progress Notes (Signed)
SUBJECTIVE: The patient presents for follow-up of aortic valve replacement and tachycardia.  Echocardiogram 04/09/17 demonstrated normal left ventricular systolic function, LVEF 97-67%, mild LVH, grade 1 diastolic dysfunction, and a normally functioning Saint Jude mechanical prosthesis in the aortic position with trivial aortic regurgitation and grossly normal function.  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  She also received chemotherapy.  She is on prednisone for SVC syndrome.  She is doing quite well overall.  Her appetite is good.  She is glad that she did not lose her hair.  She is scheduled to undergo another PET scan in April.    Review of Systems: As per "subjective", otherwise negative.  Allergies  Allergen Reactions  . Codeine     Nausea     Current Outpatient Medications  Medication Sig Dispense Refill  . enoxaparin (LOVENOX) 40 MG/0.4ML injection Inject 40 mg into the skin 2 (two) times daily.  1  . fluticasone (FLONASE) 50 MCG/ACT nasal spray Place into both nostrils as needed for allergies or rhinitis.    . metoprolol tartrate (LOPRESSOR) 25 MG tablet Take 1 tablet (25 mg total) by mouth 2 (two) times daily. 60 tablet 6  . pantoprazole (PROTONIX) 40 MG tablet Take 40 mg by mouth 2 (two) times daily.     . predniSONE (DELTASONE) 10 MG tablet Take 5-10 mg by mouth as directed.    . Sennosides 8.6 MG CAPS Take 1 capsule by mouth daily as needed.      No current facility-administered medications for this visit.     Past Medical History:  Diagnosis Date  . Acute bronchitis   . Anemia, unspecified   . Aortic stenosis    previous cardiologisit: Dr. Remigio Eisenmenger in [place], [place]  . Carotid stenosis 05/13   mild less than 50% bilaterally  . Chest pain   . COPD (chronic obstructive pulmonary disease) ([place])   . Fibromyalgia   . GERD (gastroesophageal reflux  disease)   . Murmur   . Tobacco abuse     Past Surgical History:  Procedure Laterality Date  . ABDOMINAL HYSTERECTOMY    . AORTIC VALVE REPLACEMENT  11/2011   in [place]. St. Jude 19 mm  . COLONOSCOPY    . KNEE ARTHROSCOPY WITH LATERAL MENISECTOMY Left 08/16/2014   Procedure: LEFT KNEE ARTHROSCOPY WITH LATERAL MENISECTOMY;  Surgeon: Lorn Junes, MD;  Location: [place];  Service: Orthopedics;  Laterality: Left;  . KNEE ARTHROSCOPY WITH MEDIAL MENISECTOMY Left 08/16/2014   Procedure: KNEE ARTHROSCOPY WITH MEDIAL MENISECTOMY;  Surgeon: Lorn Junes, MD;  Location: [facility];  Service: Orthopedics;  Laterality: Left;  . TONSILLECTOMY    . UPPER GI ENDOSCOPY      Social History   Socioeconomic History  . Marital status: Single    Spouse name: Not on file  . Number of children: Not on file  . Years of education: Not on file  . Highest education level: Not on file  Occupational History  . Not on file  Social Needs  . Financial resource strain: Not on file  . Food insecurity:    Worry: Not on file    Inability: Not on file  . Transportation needs:    Medical: Not on file    Non-medical: Not on file  Tobacco Use  . Smoking status: Former Smoker    Packs/day: 0.50  Years: 42.00    Pack years: 21.00    Types: Cigarettes    Start date: 07/14/1969    Last attempt to quit: 07/29/2017    Years since quitting: 0.1  . Smokeless tobacco: Never Used  Substance and Sexual Activity  . Alcohol use: No    Alcohol/week: 0.0 oz  . Drug use: No  . Sexual activity: Not on file  Lifestyle  . Physical activity:    Days per week: Not on file    Minutes per session: Not on file  . Stress: Not on file  Relationships  . Social connections:    Talks on phone: Not on file    Gets together: Not on file    Attends religious service: Not on file    Active member of club or organization: Not on file    Attends meetings of clubs or organizations: Not on file     Relationship status: Not on file  . Intimate partner violence:    Fear of current or ex partner: Not on file    Emotionally abused: Not on file    Physically abused: Not on file    Forced sexual activity: Not on file  Other Topics Concern  . Not on file  Social History Narrative  . Not on file     Vitals:   10/05/17 1554  BP: 100/68  Pulse: (!) 110  SpO2: 97%  Weight: 107 lb 12.8 oz (48.9 kg)  Height: 5\' 3"  (1.6 m)    Wt Readings from Last 3 Encounters:  10/05/17 107 lb 12.8 oz (48.9 kg)  06/09/17 109 lb (49.4 kg)  04/03/17 105 lb (47.6 kg)     PHYSICAL EXAM General: NAD HEENT: Normal. Neck: No JVD, no thyromegaly. Lungs: Clear to auscultation bilaterally with normal respiratory effort. CV: Tachycardic, [id number],YHCWCBJ6/E8 click, no B1/D1, I/VI holosystolic murmur along left sternal border. No pretibial or periankle edema.   Abdomen: Soft, nontender, no distention.  Neurologic: Alert and oriented.  Psych: Normal affect. Skin: Normal. Musculoskeletal: No gross deformities.    ECG: Most recent ECG reviewed.   Labs: Lab Results  Component Value Date/Time   HGB 15.3 (H) 08/16/2014 07:34 AM     Lipids: No results found for: LDLCALC, LDLDIRECT, CHOL, TRIG, HDL     ASSESSMENT AND PLAN: 1.  History of aortic valve replacement: Currently on Lovenox for anticoagulation.  She is apparently a slow metabolizer of warfarin.  Symptomatically stable since undergoing aortic valve replacement with a mechanical valve in [place]. She had no significant coronary artery disease on preoperative cardiac cath. She is enrolled in our anticoagulation clinic.  Echocardiogram as detailed above demonstrated normal valve function.  2.  Hypertension: Blood pressure is low normal.  3.  Tachycardia: This is likely due to underlying pulmonary process.  For now, continue metoprolol at present dose.  4.  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  She had SVC syndrome and is currently on prednisone.   Disposition: Follow up 6 months   Kate Sable, M.D., F.A.C.C.

## 2017-10-05 NOTE — Patient Instructions (Signed)

## 2017-10-08 ENCOUNTER — Other Ambulatory Visit (HOSPITAL_COMMUNITY): Payer: Self-pay | Admitting: Oncology

## 2017-10-08 DIAGNOSIS — C3401 Malignant neoplasm of right main bronchus: Secondary | ICD-10-CM

## 2017-10-21 ENCOUNTER — Encounter (HOSPITAL_COMMUNITY)
Admission: RE | Admit: 2017-10-21 | Discharge: 2017-10-21 | Disposition: A | Discharge status: Home / Self Care | Payer: BLUE CROSS/BLUE SHIELD | Source: Ambulatory Visit | Attending: Oncology | Admitting: Oncology

## 2017-10-21 DIAGNOSIS — C3401 Malignant neoplasm of right main bronchus: Secondary | ICD-10-CM | POA: Insufficient documentation

## 2017-10-21 LAB — GLUCOSE, CAPILLARY: Glucose-Capillary: 94 mg/dL (ref 65–99)

## 2017-10-21 MED ORDER — FLUDEOXYGLUCOSE F - 18 (FDG) INJECTION
5.3800 | Freq: Once | INTRAVENOUS | Status: AC | PRN
  Administered 2017-10-21: 5.38 via INTRAVENOUS

## 2018-02-25 ENCOUNTER — Other Ambulatory Visit: Payer: Self-pay | Admitting: Cardiovascular Disease

## 2018-02-25 MED ORDER — METOPROLOL TARTRATE 25 MG PO TABS: 25.0000 mg | ORAL_TABLET | Freq: Two times a day (BID) | ORAL | 3 refills | Status: DC

## 2018-02-25 NOTE — Telephone Encounter (Signed)
°  1. Which medications need to be refilled? (please list name of each medication and dose if known)    metoprolol tartrate (LOPRESSOR) 25 MG tablet  2. Which pharmacy/location (including street and city if local pharmacy) is medication to be sent to?  [facility] [place]  3. Do they need a 30 day or 90 day supply?

## 2018-02-25 NOTE — Telephone Encounter (Signed)
RX SENT

## 2018-04-06 ENCOUNTER — Other Ambulatory Visit (HOSPITAL_COMMUNITY): Payer: Self-pay | Admitting: Oncology

## 2018-04-06 DIAGNOSIS — C9000 Multiple myeloma not having achieved remission: Secondary | ICD-10-CM

## 2018-04-06 DIAGNOSIS — E859 Amyloidosis, unspecified: Principal | ICD-10-CM

## 2018-04-13 ENCOUNTER — Other Ambulatory Visit (HOSPITAL_COMMUNITY): Payer: Self-pay | Admitting: Oncology

## 2018-04-13 DIAGNOSIS — C3401 Malignant neoplasm of right main bronchus: Secondary | ICD-10-CM

## 2018-04-14 ENCOUNTER — Ambulatory Visit (HOSPITAL_COMMUNITY): Payer: BLUE CROSS/BLUE SHIELD

## 2018-04-15 ENCOUNTER — Ambulatory Visit: Payer: BLUE CROSS/BLUE SHIELD | Admitting: Cardiovascular Disease

## 2018-04-15 ENCOUNTER — Encounter: Payer: Self-pay | Admitting: Cardiovascular Disease

## 2018-04-15 VITALS — BP 105/68 | HR 108 | Ht 63.0 in | Wt 102.4 lb

## 2018-04-15 DIAGNOSIS — R000 Tachycardia, unspecified: Secondary | ICD-10-CM

## 2018-04-15 DIAGNOSIS — I10 Essential (primary) hypertension: Secondary | ICD-10-CM | POA: Diagnosis not present

## 2018-04-15 DIAGNOSIS — Z952 Presence of prosthetic heart valve: Secondary | ICD-10-CM

## 2018-04-15 DIAGNOSIS — Z5181 Encounter for therapeutic drug level monitoring: Secondary | ICD-10-CM

## 2018-04-15 DIAGNOSIS — C3491 Malignant neoplasm of unspecified part of right bronchus or lung: Secondary | ICD-10-CM

## 2018-04-15 NOTE — Patient Instructions (Signed)
Your physician wants you to follow-up in: [place] will receive a reminder letter in the mail two months in advance. If you don't receive a letter, please call our office to schedule the follow-up appointment.  Your physician recommends that you continue on your current medications as directed. Please refer to the Current Medication list given to you today.  Thank you for choosing [facility]!!

## 2018-04-15 NOTE — Progress Notes (Signed)
SUBJECTIVE: The patient presents for follow-up of aortic valve replacement and tachycardia.  Echocardiogram 04/09/17 demonstrated normal left ventricular systolic function, LVEF 03-88%, mild LVH, grade 1 diastolic dysfunction, and a normally functioning Saint Jude mechanical prosthesis in the aortic position with trivial aortic regurgitation and grossly normal function.  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  She also received chemotherapy.  She had been on prednisone for SVC syndrome.  ECG performed in the office today which I ordered and personally interpreted demonstrates sinus tachycardia, 107 bpm.  The patient denies any symptoms of chest pain, palpitations, shortness of breath, lightheadedness, dizziness, leg swelling, orthopnea, PND, and syncope.  Her appetite has improved with less dysphagia after esophageal dilatation.  She is scheduled for a PET scan next month and will also undergo bronchoscopy.    Review of Systems: As per "subjective", otherwise negative.  Allergies  Allergen Reactions  . Codeine     Nausea     Current Outpatient Medications  Medication Sig Dispense Refill  . aspirin EC 81 MG tablet Take 81 mg by mouth daily.    . calcium carbonate (CALTRATE 600) 1500 (600 Ca) MG TABS tablet Take 1 tablet by mouth daily with breakfast.    . Cholecalciferol (VITAMIN D3) 1000 units CAPS Take 1 capsule by mouth daily.    Marland Kitchen enoxaparin (LOVENOX) 40 MG/0.4ML injection Inject 40 mg into the skin 2 (two) times daily.  1  . fluticasone (FLONASE) 50 MCG/ACT nasal spray Place into both nostrils as needed for allergies or rhinitis.    Marland Kitchen HYDROcodone-acetaminophen (NORCO) 7.5-325 MG tablet Take 1 tablet by mouth daily.    Marland Kitchen levothyroxine (SYNTHROID, LEVOTHROID) 75 MCG tablet Take 1 tablet by mouth daily.    . Melatonin 5 MG TABS Take 1 tablet by mouth daily.    .  metoprolol tartrate (LOPRESSOR) 25 MG tablet Take 1 tablet (25 mg total) by mouth 2 (two) times daily. 60 tablet 3  . pantoprazole (PROTONIX) 40 MG tablet Take 40 mg by mouth 2 (two) times daily.     . Sennosides 8.6 MG CAPS Take 1 capsule by mouth daily as needed.      No current facility-administered medications for this visit.     Past Medical History:  Diagnosis Date  . Acute bronchitis   . Anemia, unspecified   . Aortic stenosis    previous cardiologisit: Dr. Remigio Eisenmenger in [place], [place]  . Carotid stenosis 05/13   mild less than 50% bilaterally  . Chest pain   . COPD (chronic obstructive pulmonary disease) ([place])   . Fibromyalgia   . GERD (gastroesophageal reflux disease)   . Murmur   . Tobacco abuse     Past Surgical History:  Procedure Laterality Date  . ABDOMINAL HYSTERECTOMY    . AORTIC VALVE REPLACEMENT  11/2011   in [place]. St. Jude 19 mm  . COLONOSCOPY    . KNEE ARTHROSCOPY WITH LATERAL MENISECTOMY Left 08/16/2014   Procedure: LEFT KNEE ARTHROSCOPY WITH LATERAL MENISECTOMY;  Surgeon: Lorn Junes, MD;  Location: [facility];  Service: Orthopedics;  Laterality: Left;  . KNEE ARTHROSCOPY WITH MEDIAL MENISECTOMY Left 08/16/2014   Procedure: KNEE ARTHROSCOPY WITH MEDIAL MENISECTOMY;  Surgeon: Lorn Junes, MD;  Location: [facility];  Service: Orthopedics;  Laterality: Left;  . TONSILLECTOMY    . UPPER GI ENDOSCOPY  Social History   Socioeconomic History  . Marital status: Single    Spouse name: Not on file  . Number of children: Not on file  . Years of education: Not on file  . Highest education level: Not on file  Occupational History  . Not on file  Social Needs  . Financial resource strain: Not on file  . Food insecurity:    Worry: Not on file    Inability: Not on file  . Transportation needs:    Medical: Not on file    Non-medical: Not on file  Tobacco Use  . Smoking status: Former Smoker    Packs/day: 0.50     Years: 42.00    Pack years: 21.00    Types: Cigarettes    Start date: 07/14/1969    Last attempt to quit: 07/29/2017    Years since quitting: 0.7  . Smokeless tobacco: Never Used  Substance and Sexual Activity  . Alcohol use: No    Alcohol/week: 0.0 standard drinks  . Drug use: No  . Sexual activity: Not on file  Lifestyle  . Physical activity:    Days per week: Not on file    Minutes per session: Not on file  . Stress: Not on file  Relationships  . Social connections:    Talks on phone: Not on file    Gets together: Not on file    Attends religious service: Not on file    Active member of club or organization: Not on file    Attends meetings of clubs or organizations: Not on file    Relationship status: Not on file  . Intimate partner violence:    Fear of current or ex partner: Not on file    Emotionally abused: Not on file    Physically abused: Not on file    Forced sexual activity: Not on file  Other Topics Concern  . Not on file  Social History Narrative  . Not on file     Vitals:   04/15/18 1300  BP: 105/68  Pulse: (!) 108  Weight: 102 lb 6.4 oz (46.4 kg)  Height: 5\' 3"  (1.6 m)    Wt Readings from Last 3 Encounters:  04/15/18 102 lb 6.4 oz (46.4 kg)  10/05/17 107 lb 12.8 oz (48.9 kg)  06/09/17 109 lb (49.4 kg)     PHYSICAL EXAM General: NAD HEENT: Normal. Neck: No JVD, no thyromegaly. Lungs: Clear to auscultation bilaterally with normal respiratory effort. CV: Tachycardic, regular rhythm, normal O1/H0 click, no Q6/V7, I/VI holosystolic murmur along left sternal border. No pretibial or periankle edema.  No carotid bruit.   Abdomen: Soft, nontender, no distention.  Neurologic: Alert and oriented.  Psych: Normal affect. Skin: Normal. Musculoskeletal: No gross deformities.    ECG: Reviewed above under Subjective   Labs: Lab Results  Component Value Date/Time   HGB 15.3 (H) 08/16/2014 07:34 AM     Lipids: No results found for: LDLCALC,  LDLDIRECT, CHOL, TRIG, HDL     ASSESSMENT AND PLAN:  1.  History of aortic valve replacement: Currently on Lovenox for anticoagulation.  She is apparently a slow metabolizer of warfarin.  Symptomatically stable since undergoing aortic valve replacement with a mechanical valve in [place]. She had no significant coronary artery disease on preoperative cardiac cath. She is enrolled in our anticoagulation clinic.  Echocardiogram as detailed above demonstrated normal valve function.  2.  Hypertension: Blood pressure is low normal.  3.  Tachycardia: This is likely due to underlying  pulmonary process.  For now, continue metoprolol at present dose.  Symptomatically stable with no lifestyle limiting symptoms.  4.  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  She had SVC syndrome and had been on prednisone.   Disposition: Follow up 6 months   Kate Sable, M.D., F.A.C.C.

## 2018-05-07 ENCOUNTER — Ambulatory Visit (HOSPITAL_COMMUNITY)
Admission: RE | Admit: 2018-05-07 | Discharge: 2018-05-07 | Disposition: A | Discharge status: Home / Self Care | Payer: BLUE CROSS/BLUE SHIELD | Source: Ambulatory Visit | Attending: Oncology | Admitting: Oncology

## 2018-05-07 ENCOUNTER — Encounter: Payer: Self-pay | Admitting: *Deleted

## 2018-05-07 DIAGNOSIS — I712 Thoracic aortic aneurysm, without rupture: Secondary | ICD-10-CM | POA: Diagnosis not present

## 2018-05-07 DIAGNOSIS — C3401 Malignant neoplasm of right main bronchus: Secondary | ICD-10-CM | POA: Insufficient documentation

## 2018-05-07 DIAGNOSIS — J90 Pleural effusion, not elsewhere classified: Secondary | ICD-10-CM | POA: Insufficient documentation

## 2018-05-07 LAB — GLUCOSE, CAPILLARY: Glucose-Capillary: 88 mg/dL (ref 70–99)

## 2018-05-07 MED ORDER — FLUDEOXYGLUCOSE F - 18 (FDG) INJECTION
5.1000 | Freq: Once | INTRAVENOUS | Status: AC | PRN
  Administered 2018-05-07: 5.1 via INTRAVENOUS

## 2018-05-18 ENCOUNTER — Telehealth: Payer: Self-pay | Admitting: *Deleted

## 2018-05-18 ENCOUNTER — Encounter: Payer: Self-pay | Admitting: *Deleted

## 2018-05-18 NOTE — Telephone Encounter (Signed)
Patient called requesting that her cardiologist look at the CT scan of abdomen and pelvis (done at [facility] and (requested) and PET scan (available in Epic) be reviewed d/t them showing an aortic aneurysm. Patient said her hematologist suggested she contact her cardiologist about these findings. Patient advised that message would be sent to provider.

## 2018-05-18 NOTE — Telephone Encounter (Signed)
I took a look at the PET scan from October 2019 which demonstrated a 4.4 cm ascending thoracic aortic aneurysm.  I would repeat CT angiography of the chest in 6 months for a reassessment.

## 2018-05-18 NOTE — Telephone Encounter (Signed)
Patient informed and verbalized understanding of plan. 

## 2018-05-29 ENCOUNTER — Other Ambulatory Visit: Payer: Self-pay | Admitting: Cardiovascular Disease

## 2018-07-15 DIAGNOSIS — J209 Acute bronchitis, unspecified: Secondary | ICD-10-CM | POA: Diagnosis not present

## 2018-07-15 DIAGNOSIS — J9859 Other diseases of mediastinum, not elsewhere classified: Secondary | ICD-10-CM | POA: Diagnosis not present

## 2018-07-15 DIAGNOSIS — J019 Acute sinusitis, unspecified: Secondary | ICD-10-CM | POA: Diagnosis not present

## 2018-07-15 DIAGNOSIS — Z681 Body mass index (BMI) 19 or less, adult: Secondary | ICD-10-CM | POA: Diagnosis not present

## 2018-07-16 DIAGNOSIS — Z952 Presence of prosthetic heart valve: Secondary | ICD-10-CM | POA: Diagnosis not present

## 2018-07-16 DIAGNOSIS — Z7901 Long term (current) use of anticoagulants: Secondary | ICD-10-CM | POA: Diagnosis not present

## 2018-07-16 DIAGNOSIS — Z5181 Encounter for therapeutic drug level monitoring: Secondary | ICD-10-CM | POA: Diagnosis not present

## 2018-07-16 DIAGNOSIS — E039 Hypothyroidism, unspecified: Secondary | ICD-10-CM | POA: Diagnosis not present

## 2018-07-16 DIAGNOSIS — C3401 Malignant neoplasm of right main bronchus: Secondary | ICD-10-CM | POA: Diagnosis not present

## 2018-07-20 DIAGNOSIS — Z08 Encounter for follow-up examination after completed treatment for malignant neoplasm: Secondary | ICD-10-CM | POA: Diagnosis not present

## 2018-07-20 DIAGNOSIS — Z952 Presence of prosthetic heart valve: Secondary | ICD-10-CM | POA: Diagnosis not present

## 2018-07-20 DIAGNOSIS — Z681 Body mass index (BMI) 19 or less, adult: Secondary | ICD-10-CM | POA: Diagnosis not present

## 2018-07-20 DIAGNOSIS — C3401 Malignant neoplasm of right main bronchus: Secondary | ICD-10-CM | POA: Diagnosis not present

## 2018-07-20 DIAGNOSIS — J9859 Other diseases of mediastinum, not elsewhere classified: Secondary | ICD-10-CM | POA: Diagnosis not present

## 2018-07-20 DIAGNOSIS — Z7901 Long term (current) use of anticoagulants: Secondary | ICD-10-CM | POA: Diagnosis not present

## 2018-07-20 DIAGNOSIS — K222 Esophageal obstruction: Secondary | ICD-10-CM | POA: Diagnosis not present

## 2018-07-20 DIAGNOSIS — K208 Other esophagitis: Secondary | ICD-10-CM | POA: Diagnosis not present

## 2018-07-22 DIAGNOSIS — E039 Hypothyroidism, unspecified: Secondary | ICD-10-CM | POA: Diagnosis not present

## 2018-07-22 DIAGNOSIS — R0601 Orthopnea: Secondary | ICD-10-CM | POA: Diagnosis not present

## 2018-07-22 DIAGNOSIS — Z7982 Long term (current) use of aspirin: Secondary | ICD-10-CM | POA: Diagnosis not present

## 2018-07-22 DIAGNOSIS — K449 Diaphragmatic hernia without obstruction or gangrene: Secondary | ICD-10-CM | POA: Diagnosis not present

## 2018-07-22 DIAGNOSIS — Z79899 Other long term (current) drug therapy: Secondary | ICD-10-CM | POA: Diagnosis not present

## 2018-07-22 DIAGNOSIS — J439 Emphysema, unspecified: Secondary | ICD-10-CM | POA: Diagnosis not present

## 2018-07-22 DIAGNOSIS — I252 Old myocardial infarction: Secondary | ICD-10-CM | POA: Diagnosis not present

## 2018-07-22 DIAGNOSIS — K219 Gastro-esophageal reflux disease without esophagitis: Secondary | ICD-10-CM | POA: Diagnosis not present

## 2018-07-22 DIAGNOSIS — I2510 Atherosclerotic heart disease of native coronary artery without angina pectoris: Secondary | ICD-10-CM | POA: Diagnosis not present

## 2018-07-22 DIAGNOSIS — Z952 Presence of prosthetic heart valve: Secondary | ICD-10-CM | POA: Diagnosis not present

## 2018-07-22 DIAGNOSIS — Z7951 Long term (current) use of inhaled steroids: Secondary | ICD-10-CM | POA: Diagnosis not present

## 2018-07-22 DIAGNOSIS — Z87891 Personal history of nicotine dependence: Secondary | ICD-10-CM | POA: Diagnosis not present

## 2018-07-22 DIAGNOSIS — Z885 Allergy status to narcotic agent status: Secondary | ICD-10-CM | POA: Diagnosis not present

## 2018-07-22 DIAGNOSIS — M810 Age-related osteoporosis without current pathological fracture: Secondary | ICD-10-CM | POA: Diagnosis not present

## 2018-07-22 DIAGNOSIS — K222 Esophageal obstruction: Secondary | ICD-10-CM | POA: Diagnosis not present

## 2018-07-22 DIAGNOSIS — Z7901 Long term (current) use of anticoagulants: Secondary | ICD-10-CM | POA: Diagnosis not present

## 2018-07-28 ENCOUNTER — Other Ambulatory Visit: Payer: Self-pay | Admitting: *Deleted

## 2018-07-28 DIAGNOSIS — I712 Thoracic aortic aneurysm, without rupture: Secondary | ICD-10-CM

## 2018-07-28 DIAGNOSIS — I7121 Aneurysm of the ascending aorta, without rupture: Secondary | ICD-10-CM

## 2018-08-04 DIAGNOSIS — C3401 Malignant neoplasm of right main bronchus: Secondary | ICD-10-CM | POA: Diagnosis not present

## 2018-08-04 DIAGNOSIS — Z923 Personal history of irradiation: Secondary | ICD-10-CM | POA: Diagnosis not present

## 2018-08-04 DIAGNOSIS — M549 Dorsalgia, unspecified: Secondary | ICD-10-CM | POA: Diagnosis not present

## 2018-08-04 DIAGNOSIS — Z87891 Personal history of nicotine dependence: Secondary | ICD-10-CM | POA: Diagnosis not present

## 2018-08-04 DIAGNOSIS — Z9225 Personal history of immunosupression therapy: Secondary | ICD-10-CM | POA: Diagnosis not present

## 2018-08-04 DIAGNOSIS — Z8701 Personal history of pneumonia (recurrent): Secondary | ICD-10-CM | POA: Diagnosis not present

## 2018-08-04 DIAGNOSIS — Z9221 Personal history of antineoplastic chemotherapy: Secondary | ICD-10-CM | POA: Diagnosis not present

## 2018-08-04 DIAGNOSIS — Z681 Body mass index (BMI) 19 or less, adult: Secondary | ICD-10-CM | POA: Diagnosis not present

## 2018-08-10 DIAGNOSIS — D519 Vitamin B12 deficiency anemia, unspecified: Secondary | ICD-10-CM | POA: Diagnosis not present

## 2018-08-10 DIAGNOSIS — D649 Anemia, unspecified: Secondary | ICD-10-CM | POA: Diagnosis not present

## 2018-08-10 DIAGNOSIS — J9859 Other diseases of mediastinum, not elsewhere classified: Secondary | ICD-10-CM | POA: Diagnosis not present

## 2018-08-10 DIAGNOSIS — R05 Cough: Secondary | ICD-10-CM | POA: Diagnosis not present

## 2018-08-10 DIAGNOSIS — Z681 Body mass index (BMI) 19 or less, adult: Secondary | ICD-10-CM | POA: Diagnosis not present

## 2018-08-11 DIAGNOSIS — J929 Pleural plaque without asbestos: Secondary | ICD-10-CM | POA: Diagnosis not present

## 2018-08-11 DIAGNOSIS — I2510 Atherosclerotic heart disease of native coronary artery without angina pectoris: Secondary | ICD-10-CM | POA: Diagnosis not present

## 2018-08-11 DIAGNOSIS — C3401 Malignant neoplasm of right main bronchus: Secondary | ICD-10-CM | POA: Diagnosis not present

## 2018-08-11 DIAGNOSIS — Z952 Presence of prosthetic heart valve: Secondary | ICD-10-CM | POA: Diagnosis not present

## 2018-08-11 DIAGNOSIS — C3491 Malignant neoplasm of unspecified part of right bronchus or lung: Secondary | ICD-10-CM | POA: Diagnosis not present

## 2018-08-11 DIAGNOSIS — G9589 Other specified diseases of spinal cord: Secondary | ICD-10-CM | POA: Diagnosis not present

## 2018-08-11 DIAGNOSIS — J90 Pleural effusion, not elsewhere classified: Secondary | ICD-10-CM | POA: Diagnosis not present

## 2018-08-14 DIAGNOSIS — J9859 Other diseases of mediastinum, not elsewhere classified: Secondary | ICD-10-CM | POA: Diagnosis not present

## 2018-08-14 DIAGNOSIS — R05 Cough: Secondary | ICD-10-CM | POA: Diagnosis not present

## 2018-08-24 DIAGNOSIS — Z952 Presence of prosthetic heart valve: Secondary | ICD-10-CM | POA: Diagnosis not present

## 2018-08-24 DIAGNOSIS — Z7901 Long term (current) use of anticoagulants: Secondary | ICD-10-CM | POA: Diagnosis not present

## 2018-08-24 DIAGNOSIS — Z08 Encounter for follow-up examination after completed treatment for malignant neoplasm: Secondary | ICD-10-CM | POA: Diagnosis not present

## 2018-08-24 DIAGNOSIS — J9859 Other diseases of mediastinum, not elsewhere classified: Secondary | ICD-10-CM | POA: Diagnosis not present

## 2018-08-26 DIAGNOSIS — Z85118 Personal history of other malignant neoplasm of bronchus and lung: Secondary | ICD-10-CM | POA: Diagnosis not present

## 2018-08-26 DIAGNOSIS — E039 Hypothyroidism, unspecified: Secondary | ICD-10-CM | POA: Diagnosis not present

## 2018-08-26 DIAGNOSIS — Z952 Presence of prosthetic heart valve: Secondary | ICD-10-CM | POA: Diagnosis not present

## 2018-08-26 DIAGNOSIS — I2510 Atherosclerotic heart disease of native coronary artery without angina pectoris: Secondary | ICD-10-CM | POA: Diagnosis not present

## 2018-08-26 DIAGNOSIS — K219 Gastro-esophageal reflux disease without esophagitis: Secondary | ICD-10-CM | POA: Diagnosis not present

## 2018-08-26 DIAGNOSIS — J439 Emphysema, unspecified: Secondary | ICD-10-CM | POA: Diagnosis not present

## 2018-08-26 DIAGNOSIS — K449 Diaphragmatic hernia without obstruction or gangrene: Secondary | ICD-10-CM | POA: Diagnosis not present

## 2018-08-26 DIAGNOSIS — I10 Essential (primary) hypertension: Secondary | ICD-10-CM | POA: Diagnosis not present

## 2018-08-26 DIAGNOSIS — K222 Esophageal obstruction: Secondary | ICD-10-CM | POA: Diagnosis not present

## 2018-08-26 DIAGNOSIS — M810 Age-related osteoporosis without current pathological fracture: Secondary | ICD-10-CM | POA: Diagnosis not present

## 2018-08-26 DIAGNOSIS — Z87891 Personal history of nicotine dependence: Secondary | ICD-10-CM | POA: Diagnosis not present

## 2018-08-26 DIAGNOSIS — I252 Old myocardial infarction: Secondary | ICD-10-CM | POA: Diagnosis not present

## 2018-08-26 DIAGNOSIS — D509 Iron deficiency anemia, unspecified: Secondary | ICD-10-CM | POA: Diagnosis not present

## 2018-09-02 DIAGNOSIS — M818 Other osteoporosis without current pathological fracture: Secondary | ICD-10-CM | POA: Diagnosis not present

## 2018-09-02 DIAGNOSIS — C3401 Malignant neoplasm of right main bronchus: Secondary | ICD-10-CM | POA: Diagnosis not present

## 2018-09-02 DIAGNOSIS — E039 Hypothyroidism, unspecified: Secondary | ICD-10-CM | POA: Diagnosis not present

## 2018-09-02 DIAGNOSIS — Z7901 Long term (current) use of anticoagulants: Secondary | ICD-10-CM | POA: Diagnosis not present

## 2018-09-02 DIAGNOSIS — J9859 Other diseases of mediastinum, not elsewhere classified: Secondary | ICD-10-CM | POA: Diagnosis not present

## 2018-09-02 DIAGNOSIS — Z681 Body mass index (BMI) 19 or less, adult: Secondary | ICD-10-CM | POA: Diagnosis not present

## 2018-09-02 DIAGNOSIS — K222 Esophageal obstruction: Secondary | ICD-10-CM | POA: Diagnosis not present

## 2018-09-07 DIAGNOSIS — R05 Cough: Secondary | ICD-10-CM | POA: Diagnosis not present

## 2018-09-07 DIAGNOSIS — Z681 Body mass index (BMI) 19 or less, adult: Secondary | ICD-10-CM | POA: Diagnosis not present

## 2018-09-08 DIAGNOSIS — R918 Other nonspecific abnormal finding of lung field: Secondary | ICD-10-CM | POA: Diagnosis not present

## 2018-09-08 DIAGNOSIS — J90 Pleural effusion, not elsewhere classified: Secondary | ICD-10-CM | POA: Diagnosis not present

## 2018-09-13 DIAGNOSIS — D519 Vitamin B12 deficiency anemia, unspecified: Secondary | ICD-10-CM | POA: Diagnosis not present

## 2018-09-16 DIAGNOSIS — Z7982 Long term (current) use of aspirin: Secondary | ICD-10-CM | POA: Diagnosis not present

## 2018-09-16 DIAGNOSIS — J439 Emphysema, unspecified: Secondary | ICD-10-CM | POA: Diagnosis not present

## 2018-09-16 DIAGNOSIS — Z9221 Personal history of antineoplastic chemotherapy: Secondary | ICD-10-CM | POA: Diagnosis not present

## 2018-09-16 DIAGNOSIS — D509 Iron deficiency anemia, unspecified: Secondary | ICD-10-CM | POA: Diagnosis not present

## 2018-09-16 DIAGNOSIS — Z885 Allergy status to narcotic agent status: Secondary | ICD-10-CM | POA: Diagnosis not present

## 2018-09-16 DIAGNOSIS — E039 Hypothyroidism, unspecified: Secondary | ICD-10-CM | POA: Diagnosis not present

## 2018-09-16 DIAGNOSIS — Z87891 Personal history of nicotine dependence: Secondary | ICD-10-CM | POA: Diagnosis not present

## 2018-09-16 DIAGNOSIS — Z923 Personal history of irradiation: Secondary | ICD-10-CM | POA: Diagnosis not present

## 2018-09-16 DIAGNOSIS — I2510 Atherosclerotic heart disease of native coronary artery without angina pectoris: Secondary | ICD-10-CM | POA: Diagnosis not present

## 2018-09-16 DIAGNOSIS — Z79899 Other long term (current) drug therapy: Secondary | ICD-10-CM | POA: Diagnosis not present

## 2018-09-16 DIAGNOSIS — Z952 Presence of prosthetic heart valve: Secondary | ICD-10-CM | POA: Diagnosis not present

## 2018-09-16 DIAGNOSIS — R942 Abnormal results of pulmonary function studies: Secondary | ICD-10-CM | POA: Diagnosis not present

## 2018-09-16 DIAGNOSIS — Z7951 Long term (current) use of inhaled steroids: Secondary | ICD-10-CM | POA: Diagnosis not present

## 2018-09-16 DIAGNOSIS — M810 Age-related osteoporosis without current pathological fracture: Secondary | ICD-10-CM | POA: Diagnosis not present

## 2018-09-16 DIAGNOSIS — K222 Esophageal obstruction: Secondary | ICD-10-CM | POA: Diagnosis not present

## 2018-09-16 DIAGNOSIS — K449 Diaphragmatic hernia without obstruction or gangrene: Secondary | ICD-10-CM | POA: Diagnosis not present

## 2018-09-16 DIAGNOSIS — I252 Old myocardial infarction: Secondary | ICD-10-CM | POA: Diagnosis not present

## 2018-09-16 DIAGNOSIS — K219 Gastro-esophageal reflux disease without esophagitis: Secondary | ICD-10-CM | POA: Diagnosis not present

## 2018-09-16 DIAGNOSIS — Z7901 Long term (current) use of anticoagulants: Secondary | ICD-10-CM | POA: Diagnosis not present

## 2018-09-16 DIAGNOSIS — R05 Cough: Secondary | ICD-10-CM | POA: Diagnosis not present

## 2018-09-22 DIAGNOSIS — Z952 Presence of prosthetic heart valve: Secondary | ICD-10-CM | POA: Diagnosis not present

## 2018-09-27 DIAGNOSIS — Z952 Presence of prosthetic heart valve: Secondary | ICD-10-CM | POA: Diagnosis not present

## 2018-09-27 DIAGNOSIS — Z7901 Long term (current) use of anticoagulants: Secondary | ICD-10-CM | POA: Diagnosis not present

## 2018-09-27 DIAGNOSIS — J9859 Other diseases of mediastinum, not elsewhere classified: Secondary | ICD-10-CM | POA: Diagnosis not present

## 2018-09-27 DIAGNOSIS — C3401 Malignant neoplasm of right main bronchus: Secondary | ICD-10-CM | POA: Diagnosis not present

## 2018-10-06 DIAGNOSIS — Z952 Presence of prosthetic heart valve: Secondary | ICD-10-CM | POA: Diagnosis not present

## 2018-10-13 DIAGNOSIS — Z952 Presence of prosthetic heart valve: Secondary | ICD-10-CM | POA: Diagnosis not present

## 2018-10-19 DIAGNOSIS — Z681 Body mass index (BMI) 19 or less, adult: Secondary | ICD-10-CM | POA: Diagnosis not present

## 2018-10-19 DIAGNOSIS — J9859 Other diseases of mediastinum, not elsewhere classified: Secondary | ICD-10-CM | POA: Diagnosis not present

## 2018-10-19 DIAGNOSIS — K222 Esophageal obstruction: Secondary | ICD-10-CM | POA: Diagnosis not present

## 2018-10-19 DIAGNOSIS — S22000A Wedge compression fracture of unspecified thoracic vertebra, initial encounter for closed fracture: Secondary | ICD-10-CM | POA: Diagnosis not present

## 2018-10-19 DIAGNOSIS — Z7901 Long term (current) use of anticoagulants: Secondary | ICD-10-CM | POA: Diagnosis not present

## 2018-10-19 DIAGNOSIS — C3401 Malignant neoplasm of right main bronchus: Secondary | ICD-10-CM | POA: Diagnosis not present

## 2018-10-20 ENCOUNTER — Telehealth: Payer: Self-pay | Admitting: *Deleted

## 2018-10-20 NOTE — Telephone Encounter (Signed)
   Primary Cardiologist:  Kate Sable, MD   Patient contacted.  History reviewed.  No symptoms to suggest any unstable cardiac conditions.  Based on discussion, with current pandemic situation, we will be postponing this appointment for Judy Sawyer with a plan for f/u in July 2020 or sooner if feasible/necessary.  If symptoms change, she has been instructed to contact our office.    Marlou Sa, RN  10/20/2018 4:30 PM         .

## 2018-10-21 DIAGNOSIS — I700 Atherosclerosis of aorta: Secondary | ICD-10-CM | POA: Diagnosis not present

## 2018-10-21 DIAGNOSIS — C3401 Malignant neoplasm of right main bronchus: Secondary | ICD-10-CM | POA: Diagnosis not present

## 2018-10-21 DIAGNOSIS — J439 Emphysema, unspecified: Secondary | ICD-10-CM | POA: Diagnosis not present

## 2018-10-21 DIAGNOSIS — C3411 Malignant neoplasm of upper lobe, right bronchus or lung: Secondary | ICD-10-CM | POA: Diagnosis not present

## 2018-10-26 ENCOUNTER — Ambulatory Visit: Payer: BLUE CROSS/BLUE SHIELD | Admitting: Cardiovascular Disease

## 2018-10-27 DIAGNOSIS — Z7901 Long term (current) use of anticoagulants: Secondary | ICD-10-CM | POA: Diagnosis not present

## 2018-10-27 DIAGNOSIS — C3401 Malignant neoplasm of right main bronchus: Secondary | ICD-10-CM | POA: Diagnosis not present

## 2018-10-27 DIAGNOSIS — M818 Other osteoporosis without current pathological fracture: Secondary | ICD-10-CM | POA: Diagnosis not present

## 2018-10-27 DIAGNOSIS — S22000A Wedge compression fracture of unspecified thoracic vertebra, initial encounter for closed fracture: Secondary | ICD-10-CM | POA: Diagnosis not present

## 2018-10-27 DIAGNOSIS — D500 Iron deficiency anemia secondary to blood loss (chronic): Secondary | ICD-10-CM | POA: Diagnosis not present

## 2018-10-27 DIAGNOSIS — Z952 Presence of prosthetic heart valve: Secondary | ICD-10-CM | POA: Diagnosis not present

## 2018-10-27 DIAGNOSIS — Z5181 Encounter for therapeutic drug level monitoring: Secondary | ICD-10-CM | POA: Diagnosis not present

## 2018-10-28 DIAGNOSIS — Z681 Body mass index (BMI) 19 or less, adult: Secondary | ICD-10-CM | POA: Diagnosis not present

## 2018-10-28 DIAGNOSIS — D519 Vitamin B12 deficiency anemia, unspecified: Secondary | ICD-10-CM | POA: Diagnosis not present

## 2018-11-01 DIAGNOSIS — S22000A Wedge compression fracture of unspecified thoracic vertebra, initial encounter for closed fracture: Secondary | ICD-10-CM | POA: Diagnosis not present

## 2018-11-01 DIAGNOSIS — R29890 Loss of height: Secondary | ICD-10-CM | POA: Diagnosis not present

## 2018-11-03 DIAGNOSIS — Z681 Body mass index (BMI) 19 or less, adult: Secondary | ICD-10-CM | POA: Diagnosis not present

## 2018-11-03 DIAGNOSIS — C3401 Malignant neoplasm of right main bronchus: Secondary | ICD-10-CM | POA: Diagnosis not present

## 2018-11-03 DIAGNOSIS — M818 Other osteoporosis without current pathological fracture: Secondary | ICD-10-CM | POA: Diagnosis not present

## 2018-11-03 DIAGNOSIS — S22000A Wedge compression fracture of unspecified thoracic vertebra, initial encounter for closed fracture: Secondary | ICD-10-CM | POA: Diagnosis not present

## 2018-11-03 DIAGNOSIS — Z5181 Encounter for therapeutic drug level monitoring: Secondary | ICD-10-CM | POA: Diagnosis not present

## 2018-11-03 DIAGNOSIS — D500 Iron deficiency anemia secondary to blood loss (chronic): Secondary | ICD-10-CM | POA: Diagnosis not present

## 2018-11-08 DIAGNOSIS — D500 Iron deficiency anemia secondary to blood loss (chronic): Secondary | ICD-10-CM | POA: Diagnosis not present

## 2018-11-08 DIAGNOSIS — S22000A Wedge compression fracture of unspecified thoracic vertebra, initial encounter for closed fracture: Secondary | ICD-10-CM | POA: Diagnosis not present

## 2018-11-15 DIAGNOSIS — D500 Iron deficiency anemia secondary to blood loss (chronic): Secondary | ICD-10-CM | POA: Diagnosis not present

## 2018-11-15 DIAGNOSIS — R350 Frequency of micturition: Secondary | ICD-10-CM | POA: Diagnosis not present

## 2018-11-15 DIAGNOSIS — Z5181 Encounter for therapeutic drug level monitoring: Secondary | ICD-10-CM | POA: Diagnosis not present

## 2018-11-22 DIAGNOSIS — N3090 Cystitis, unspecified without hematuria: Secondary | ICD-10-CM | POA: Diagnosis not present

## 2018-11-22 DIAGNOSIS — D500 Iron deficiency anemia secondary to blood loss (chronic): Secondary | ICD-10-CM | POA: Diagnosis not present

## 2018-11-22 DIAGNOSIS — Z5181 Encounter for therapeutic drug level monitoring: Secondary | ICD-10-CM | POA: Diagnosis not present

## 2018-11-25 DIAGNOSIS — N3090 Cystitis, unspecified without hematuria: Secondary | ICD-10-CM | POA: Diagnosis not present

## 2018-11-25 DIAGNOSIS — D500 Iron deficiency anemia secondary to blood loss (chronic): Secondary | ICD-10-CM | POA: Diagnosis not present

## 2018-11-25 DIAGNOSIS — Z5181 Encounter for therapeutic drug level monitoring: Secondary | ICD-10-CM | POA: Diagnosis not present

## 2018-11-26 DIAGNOSIS — C3401 Malignant neoplasm of right main bronchus: Secondary | ICD-10-CM | POA: Diagnosis not present

## 2018-11-26 DIAGNOSIS — M818 Other osteoporosis without current pathological fracture: Secondary | ICD-10-CM | POA: Diagnosis not present

## 2018-11-26 DIAGNOSIS — Z952 Presence of prosthetic heart valve: Secondary | ICD-10-CM | POA: Diagnosis not present

## 2018-11-26 DIAGNOSIS — Z7901 Long term (current) use of anticoagulants: Secondary | ICD-10-CM | POA: Diagnosis not present

## 2018-11-26 DIAGNOSIS — S22000D Wedge compression fracture of unspecified thoracic vertebra, subsequent encounter for fracture with routine healing: Secondary | ICD-10-CM | POA: Diagnosis not present

## 2018-12-03 DIAGNOSIS — I2510 Atherosclerotic heart disease of native coronary artery without angina pectoris: Secondary | ICD-10-CM | POA: Diagnosis not present

## 2018-12-03 DIAGNOSIS — I712 Thoracic aortic aneurysm, without rupture: Secondary | ICD-10-CM | POA: Diagnosis not present

## 2018-12-03 DIAGNOSIS — E039 Hypothyroidism, unspecified: Secondary | ICD-10-CM | POA: Diagnosis not present

## 2018-12-03 DIAGNOSIS — J209 Acute bronchitis, unspecified: Secondary | ICD-10-CM | POA: Diagnosis not present

## 2018-12-03 DIAGNOSIS — M818 Other osteoporosis without current pathological fracture: Secondary | ICD-10-CM | POA: Diagnosis not present

## 2018-12-03 DIAGNOSIS — J701 Chronic and other pulmonary manifestations due to radiation: Secondary | ICD-10-CM | POA: Diagnosis not present

## 2018-12-03 DIAGNOSIS — J432 Centrilobular emphysema: Secondary | ICD-10-CM | POA: Diagnosis not present

## 2018-12-03 DIAGNOSIS — I6529 Occlusion and stenosis of unspecified carotid artery: Secondary | ICD-10-CM | POA: Diagnosis not present

## 2018-12-03 DIAGNOSIS — K222 Esophageal obstruction: Secondary | ICD-10-CM | POA: Diagnosis not present

## 2018-12-03 DIAGNOSIS — I700 Atherosclerosis of aorta: Secondary | ICD-10-CM | POA: Diagnosis not present

## 2018-12-03 DIAGNOSIS — Z85118 Personal history of other malignant neoplasm of bronchus and lung: Secondary | ICD-10-CM | POA: Diagnosis not present

## 2018-12-03 DIAGNOSIS — J9859 Other diseases of mediastinum, not elsewhere classified: Secondary | ICD-10-CM | POA: Diagnosis not present

## 2018-12-03 DIAGNOSIS — R222 Localized swelling, mass and lump, trunk: Secondary | ICD-10-CM | POA: Diagnosis not present

## 2018-12-03 DIAGNOSIS — Z952 Presence of prosthetic heart valve: Secondary | ICD-10-CM | POA: Diagnosis not present

## 2018-12-09 DIAGNOSIS — G4733 Obstructive sleep apnea (adult) (pediatric): Secondary | ICD-10-CM | POA: Diagnosis not present

## 2018-12-09 DIAGNOSIS — J449 Chronic obstructive pulmonary disease, unspecified: Secondary | ICD-10-CM | POA: Diagnosis not present

## 2018-12-09 DIAGNOSIS — Z87891 Personal history of nicotine dependence: Secondary | ICD-10-CM | POA: Diagnosis not present

## 2018-12-21 DIAGNOSIS — Z1159 Encounter for screening for other viral diseases: Secondary | ICD-10-CM | POA: Diagnosis not present

## 2018-12-23 DIAGNOSIS — R1310 Dysphagia, unspecified: Secondary | ICD-10-CM | POA: Diagnosis not present

## 2018-12-23 DIAGNOSIS — K222 Esophageal obstruction: Secondary | ICD-10-CM | POA: Diagnosis not present

## 2018-12-23 DIAGNOSIS — J449 Chronic obstructive pulmonary disease, unspecified: Secondary | ICD-10-CM | POA: Diagnosis not present

## 2018-12-23 DIAGNOSIS — K449 Diaphragmatic hernia without obstruction or gangrene: Secondary | ICD-10-CM | POA: Diagnosis not present

## 2018-12-23 DIAGNOSIS — Z87891 Personal history of nicotine dependence: Secondary | ICD-10-CM | POA: Diagnosis not present

## 2018-12-23 DIAGNOSIS — I2510 Atherosclerotic heart disease of native coronary artery without angina pectoris: Secondary | ICD-10-CM | POA: Diagnosis not present

## 2018-12-23 DIAGNOSIS — K219 Gastro-esophageal reflux disease without esophagitis: Secondary | ICD-10-CM | POA: Diagnosis not present

## 2018-12-23 DIAGNOSIS — Z7982 Long term (current) use of aspirin: Secondary | ICD-10-CM | POA: Diagnosis not present

## 2018-12-23 DIAGNOSIS — E039 Hypothyroidism, unspecified: Secondary | ICD-10-CM | POA: Diagnosis not present

## 2018-12-23 DIAGNOSIS — Z7901 Long term (current) use of anticoagulants: Secondary | ICD-10-CM | POA: Diagnosis not present

## 2018-12-29 DIAGNOSIS — D500 Iron deficiency anemia secondary to blood loss (chronic): Secondary | ICD-10-CM | POA: Diagnosis not present

## 2018-12-29 DIAGNOSIS — Z5181 Encounter for therapeutic drug level monitoring: Secondary | ICD-10-CM | POA: Diagnosis not present

## 2018-12-29 DIAGNOSIS — N3090 Cystitis, unspecified without hematuria: Secondary | ICD-10-CM | POA: Diagnosis not present

## 2018-12-31 DIAGNOSIS — Z7901 Long term (current) use of anticoagulants: Secondary | ICD-10-CM | POA: Diagnosis not present

## 2018-12-31 DIAGNOSIS — J449 Chronic obstructive pulmonary disease, unspecified: Secondary | ICD-10-CM | POA: Diagnosis not present

## 2018-12-31 DIAGNOSIS — R0683 Snoring: Secondary | ICD-10-CM | POA: Diagnosis not present

## 2018-12-31 DIAGNOSIS — Z952 Presence of prosthetic heart valve: Secondary | ICD-10-CM | POA: Diagnosis not present

## 2018-12-31 DIAGNOSIS — M818 Other osteoporosis without current pathological fracture: Secondary | ICD-10-CM | POA: Diagnosis not present

## 2018-12-31 DIAGNOSIS — J9859 Other diseases of mediastinum, not elsewhere classified: Secondary | ICD-10-CM | POA: Diagnosis not present

## 2019-01-04 DIAGNOSIS — D519 Vitamin B12 deficiency anemia, unspecified: Secondary | ICD-10-CM | POA: Diagnosis not present

## 2019-01-12 ENCOUNTER — Other Ambulatory Visit: Payer: Self-pay | Admitting: *Deleted

## 2019-01-12 MED ORDER — METOPROLOL TARTRATE 25 MG PO TABS: 25.0000 mg | ORAL_TABLET | Freq: Two times a day (BID) | ORAL | 1 refills | Status: DC

## 2019-01-20 ENCOUNTER — Encounter: Payer: Self-pay | Admitting: Cardiovascular Disease

## 2019-01-20 ENCOUNTER — Telehealth (INDEPENDENT_AMBULATORY_CARE_PROVIDER_SITE_OTHER): Payer: Medicare Other | Admitting: Cardiovascular Disease

## 2019-01-20 ENCOUNTER — Encounter: Payer: Self-pay | Admitting: *Deleted

## 2019-01-20 VITALS — BP 120/78 | Ht 63.0 in | Wt 108.8 lb

## 2019-01-20 DIAGNOSIS — I712 Thoracic aortic aneurysm, without rupture: Secondary | ICD-10-CM

## 2019-01-20 DIAGNOSIS — I10 Essential (primary) hypertension: Secondary | ICD-10-CM

## 2019-01-20 DIAGNOSIS — I7121 Aneurysm of the ascending aorta, without rupture: Secondary | ICD-10-CM

## 2019-01-20 DIAGNOSIS — Z952 Presence of prosthetic heart valve: Secondary | ICD-10-CM

## 2019-01-20 NOTE — Patient Instructions (Signed)

## 2019-01-20 NOTE — Progress Notes (Signed)
Virtual Visit via Video Note   This visit type was conducted due to national recommendations for restrictions regarding the COVID-19 Pandemic (e.g. social distancing) in an effort to limit this patient's exposure and mitigate transmission in our community.  Due to her co-morbid illnesses, this patient is at least at moderate risk for complications without adequate follow up.  This format is felt to be most appropriate for this patient at this time.  All issues noted in this document were discussed and addressed.  A limited physical exam was performed with this format.  Please refer to the patient's chart for her consent to telehealth for [facility].   Date:  01/20/2019   ID:  Judy Sawyer, DOB [birth year], MRN [id number]  Patient Location: Home Provider Location: Office  PCP:  Denny Levy, [place]  Cardiologist:  Kate Sable, MD  Electrophysiologist:  None   Evaluation Performed:  Follow-Up Visit  Chief Complaint: Mechanical aortic valve  History of Present Illness:    Judy Sawyer is a 63 y.o. female with a history of mechanical aortic valve replacement performed in [place].  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  She also has an ascending thoracic aortic aneurysm.  She apparently underwent thoracic imaging in April 2020 but I do not have a copy of the results.  She denies exertional chest pain.  She has had some increasing exertional dyspnea over the past few months and does have COPD in addition adenocarcinoma.  She has been treated with both chemotherapy and radiation therapy.  She told me she is undergone 18 esophageal dilatations.  The patient does not have symptoms concerning for COVID-19 infection (fever, chills, cough, or new shortness of breath).    Past Medical History:  Diagnosis Date  . Acute bronchitis   . Anemia, unspecified   . Aortic stenosis    previous  cardiologisit: Dr. Remigio Eisenmenger in [place], [place]  . Carotid stenosis 05/13   mild less than 50% bilaterally  . Chest pain   . COPD (chronic obstructive pulmonary disease) ([place])   . Fibromyalgia   . GERD (gastroesophageal reflux disease)   . Murmur   . Tobacco abuse    Past Surgical History:  Procedure Laterality Date  . ABDOMINAL HYSTERECTOMY    . AORTIC VALVE REPLACEMENT  11/2011   in [place]. St. Jude 19 mm  . COLONOSCOPY    . KNEE ARTHROSCOPY WITH LATERAL MENISECTOMY Left 08/16/2014   Procedure: LEFT KNEE ARTHROSCOPY WITH LATERAL MENISECTOMY;  Surgeon: Lorn Junes, MD;  Location: [place];  Service: Orthopedics;  Laterality: Left;  . KNEE ARTHROSCOPY WITH MEDIAL MENISECTOMY Left 08/16/2014   Procedure: KNEE ARTHROSCOPY WITH MEDIAL MENISECTOMY;  Surgeon: Lorn Junes, MD;  Location: [place];  Service: Orthopedics;  Laterality: Left;  . TONSILLECTOMY    . UPPER GI ENDOSCOPY       Current Meds  Medication Sig  . aspirin EC 81 MG tablet Take 81 mg by mouth daily.  . calcium carbonate (CALTRATE 600) 1500 (600 Ca) MG TABS tablet Take 1 tablet by mouth daily with breakfast.  . Cholecalciferol (VITAMIN D3) 1000 units CAPS Take 1 capsule by mouth daily.  Marland Kitchen enoxaparin (LOVENOX) 40 MG/0.4ML injection Inject 40 mg into the skin 2 (two) times daily.  . fluticasone (FLONASE) 50 MCG/ACT nasal spray Place into both nostrils as needed for allergies or rhinitis.  Marland Kitchen HYDROcodone-acetaminophen (  NORCO) 7.5-325 MG tablet Take 1 tablet by mouth daily.  Marland Kitchen levothyroxine (SYNTHROID) 100 MCG tablet Take 100 mcg by mouth daily.  . Melatonin 5 MG TABS Take 1 tablet by mouth daily.  . metoprolol tartrate (LOPRESSOR) 25 MG tablet Take 1 tablet (25 mg total) by mouth 2 (two) times daily.  . pantoprazole (PROTONIX) 40 MG tablet Take 40 mg by mouth 2 (two) times daily.   . prochlorperazine (COMPAZINE) 10 MG tablet Take 10 mg by mouth every 6 (six) hours as needed for  nausea or vomiting.  . Sennosides 8.6 MG CAPS Take 1 capsule by mouth daily as needed.   . [DISCONTINUED] levothyroxine (SYNTHROID, LEVOTHROID) 75 MCG tablet Take 1 tablet by mouth daily.     Allergies:   Codeine   Social History   Tobacco Use  . Smoking status: Former Smoker    Packs/day: 0.50    Years: 42.00    Pack years: 21.00    Types: Cigarettes    Start date: 07/14/1969    Quit date: 07/29/2017    Years since quitting: 1.4  . Smokeless tobacco: Never Used  Substance Use Topics  . Alcohol use: No    Alcohol/week: 0.0 standard drinks  . Drug use: No     Family Hx: The patient's family history includes Cancer in her mother; Diabetes type II in her sister; Emphysema in her father; Heart attack in her mother; Heart disease in her sister; Liver disease in her father; Rheumatic fever in her sister.  ROS:   Please see the history of present illness.     All other systems reviewed and are negative.   Prior CV studies:   The following studies were reviewed today:   Labs/Other Tests and Data Reviewed:    EKG:  No ECG reviewed.  Recent Labs: No results found for requested labs within last 8760 hours.   Recent Lipid Panel No results found for: CHOL, TRIG, HDL, CHOLHDL, LDLCALC, LDLDIRECT  Wt Readings from Last 3 Encounters:  01/20/19 108 lb 12.8 oz (49.4 kg)  04/15/18 102 lb 6.4 oz (46.4 kg)  10/05/17 107 lb 12.8 oz (48.9 kg)     Objective:    Vital Signs:  BP 120/78   Ht 5\' 3"  (1.6 m)   Wt 108 lb 12.8 oz (49.4 kg)   BMI 19.27 kg/m    VITAL SIGNS:  reviewed GEN:  no acute distress EYES:  sclerae anicteric, EOMI - Extraocular Movements Intact RESPIRATORY:  normal respiratory effort, symmetric expansion MUSCULOSKELETAL:  no obvious deformities. NEURO:  alert and oriented x 3, no obvious focal deficit PSYCH:  normal affect  ASSESSMENT & PLAN:    1.  Ascending thoracic aortic aneurysm -PET scan in October 2019 demonstrated a 4.4 cm ascending thoracic aortic  aneurysm.  She underwent thoracic imaging in April 2020 [facility].  I will obtain a copy of these results.  2.  Mechanical Saint Jude aortic valve -Functioning normally by echocardiogram in September 2018.  She is on Lovenox as she is apparently a slow metabolizer of warfarin.  This is managed by hematology at [facility].  She underwent aortic valve replacement in [place].  She had no significant coronary artery disease on preoperative cardiac catheterization.   3.  Hypertension -Blood pressure is normal.  No changes to therapy.      COVID-19 Education: The signs and symptoms of COVID-19 were discussed with the patient and how to seek care for testing (follow up with PCP or arrange E-visit).  The importance of social distancing was discussed today.  Time:   Today, I have spent 15 minutes with the patient with telehealth technology discussing the above problems.     Medication Adjustments/Labs and Tests Ordered: Current medicines are reviewed at length with the patient today.  Concerns regarding medicines are outlined above.   Tests Ordered: No orders of the defined types were placed in this encounter.   Medication Changes: No orders of the defined types were placed in this encounter.   Follow Up:  Virtual Visit or In Person in 1 year(s)  Signed, Kate Sable, MD  01/20/2019 11:01 AM    [place]

## 2019-01-24 ENCOUNTER — Telehealth: Payer: Self-pay | Admitting: *Deleted

## 2019-01-24 NOTE — Telephone Encounter (Signed)
Notes recorded by Laurine Blazer, LPN on 10/04/4008 at 27:25 AM EDT  Patient notified. Copy to pmd.  ------   Notes recorded by Laurine Blazer, LPN on 3/66/4403 at 4:74 PM EDT  Left message to return call.   ------   Notes recorded by Herminio Commons, MD on 01/21/2019 at 10:19 AM EDT  Aneurysm is very mild in size (4.1 cm). Repeat CT in April 2021.

## 2019-01-26 DIAGNOSIS — D500 Iron deficiency anemia secondary to blood loss (chronic): Secondary | ICD-10-CM | POA: Diagnosis not present

## 2019-01-26 DIAGNOSIS — N3090 Cystitis, unspecified without hematuria: Secondary | ICD-10-CM | POA: Diagnosis not present

## 2019-01-26 DIAGNOSIS — C3401 Malignant neoplasm of right main bronchus: Secondary | ICD-10-CM | POA: Diagnosis not present

## 2019-01-26 DIAGNOSIS — Z5181 Encounter for therapeutic drug level monitoring: Secondary | ICD-10-CM | POA: Diagnosis not present

## 2019-01-27 ENCOUNTER — Other Ambulatory Visit (HOSPITAL_BASED_OUTPATIENT_CLINIC_OR_DEPARTMENT_OTHER): Payer: Self-pay

## 2019-01-27 DIAGNOSIS — R5383 Other fatigue: Secondary | ICD-10-CM

## 2019-01-27 DIAGNOSIS — G4730 Sleep apnea, unspecified: Secondary | ICD-10-CM

## 2019-02-01 DIAGNOSIS — D519 Vitamin B12 deficiency anemia, unspecified: Secondary | ICD-10-CM | POA: Diagnosis not present

## 2019-02-02 DIAGNOSIS — Z7901 Long term (current) use of anticoagulants: Secondary | ICD-10-CM | POA: Diagnosis not present

## 2019-02-02 DIAGNOSIS — K222 Esophageal obstruction: Secondary | ICD-10-CM | POA: Diagnosis not present

## 2019-02-02 DIAGNOSIS — Z952 Presence of prosthetic heart valve: Secondary | ICD-10-CM | POA: Diagnosis not present

## 2019-02-02 DIAGNOSIS — R0683 Snoring: Secondary | ICD-10-CM | POA: Diagnosis not present

## 2019-02-03 ENCOUNTER — Ambulatory Visit: Payer: Medicare Other | Attending: Oncology | Admitting: Neurology

## 2019-02-03 ENCOUNTER — Other Ambulatory Visit: Payer: Self-pay

## 2019-02-03 DIAGNOSIS — R5383 Other fatigue: Secondary | ICD-10-CM

## 2019-02-07 DIAGNOSIS — N3090 Cystitis, unspecified without hematuria: Secondary | ICD-10-CM | POA: Diagnosis not present

## 2019-02-07 DIAGNOSIS — E039 Hypothyroidism, unspecified: Secondary | ICD-10-CM | POA: Diagnosis not present

## 2019-02-07 DIAGNOSIS — Z5181 Encounter for therapeutic drug level monitoring: Secondary | ICD-10-CM | POA: Diagnosis not present

## 2019-02-07 DIAGNOSIS — D500 Iron deficiency anemia secondary to blood loss (chronic): Secondary | ICD-10-CM | POA: Diagnosis not present

## 2019-02-09 DIAGNOSIS — K219 Gastro-esophageal reflux disease without esophagitis: Secondary | ICD-10-CM | POA: Diagnosis not present

## 2019-02-09 DIAGNOSIS — J9859 Other diseases of mediastinum, not elsewhere classified: Secondary | ICD-10-CM | POA: Diagnosis not present

## 2019-02-09 DIAGNOSIS — Z681 Body mass index (BMI) 19 or less, adult: Secondary | ICD-10-CM | POA: Diagnosis not present

## 2019-02-09 DIAGNOSIS — D649 Anemia, unspecified: Secondary | ICD-10-CM | POA: Diagnosis not present

## 2019-02-09 DIAGNOSIS — J449 Chronic obstructive pulmonary disease, unspecified: Secondary | ICD-10-CM | POA: Diagnosis not present

## 2019-02-09 DIAGNOSIS — C3401 Malignant neoplasm of right main bronchus: Secondary | ICD-10-CM | POA: Diagnosis not present

## 2019-02-11 DIAGNOSIS — I700 Atherosclerosis of aorta: Secondary | ICD-10-CM | POA: Diagnosis not present

## 2019-02-11 DIAGNOSIS — I719 Aortic aneurysm of unspecified site, without rupture: Secondary | ICD-10-CM | POA: Diagnosis not present

## 2019-02-11 DIAGNOSIS — C3401 Malignant neoplasm of right main bronchus: Secondary | ICD-10-CM | POA: Diagnosis not present

## 2019-02-11 DIAGNOSIS — J439 Emphysema, unspecified: Secondary | ICD-10-CM | POA: Diagnosis not present

## 2019-02-11 DIAGNOSIS — J90 Pleural effusion, not elsewhere classified: Secondary | ICD-10-CM | POA: Diagnosis not present

## 2019-02-11 DIAGNOSIS — C3490 Malignant neoplasm of unspecified part of unspecified bronchus or lung: Secondary | ICD-10-CM | POA: Diagnosis not present

## 2019-02-15 DIAGNOSIS — Z5181 Encounter for therapeutic drug level monitoring: Secondary | ICD-10-CM | POA: Diagnosis not present

## 2019-02-15 DIAGNOSIS — D500 Iron deficiency anemia secondary to blood loss (chronic): Secondary | ICD-10-CM | POA: Diagnosis not present

## 2019-02-15 DIAGNOSIS — N3090 Cystitis, unspecified without hematuria: Secondary | ICD-10-CM | POA: Diagnosis not present

## 2019-02-21 DIAGNOSIS — J700 Acute pulmonary manifestations due to radiation: Secondary | ICD-10-CM | POA: Diagnosis not present

## 2019-02-21 DIAGNOSIS — N3090 Cystitis, unspecified without hematuria: Secondary | ICD-10-CM | POA: Diagnosis not present

## 2019-02-21 DIAGNOSIS — J449 Chronic obstructive pulmonary disease, unspecified: Secondary | ICD-10-CM | POA: Diagnosis not present

## 2019-02-21 DIAGNOSIS — E039 Hypothyroidism, unspecified: Secondary | ICD-10-CM | POA: Diagnosis not present

## 2019-02-21 DIAGNOSIS — K222 Esophageal obstruction: Secondary | ICD-10-CM | POA: Diagnosis not present

## 2019-02-21 DIAGNOSIS — Z7901 Long term (current) use of anticoagulants: Secondary | ICD-10-CM | POA: Diagnosis not present

## 2019-02-21 DIAGNOSIS — C3401 Malignant neoplasm of right main bronchus: Secondary | ICD-10-CM | POA: Diagnosis not present

## 2019-02-21 DIAGNOSIS — D500 Iron deficiency anemia secondary to blood loss (chronic): Secondary | ICD-10-CM | POA: Diagnosis not present

## 2019-02-21 DIAGNOSIS — Z952 Presence of prosthetic heart valve: Secondary | ICD-10-CM | POA: Diagnosis not present

## 2019-02-21 DIAGNOSIS — Z5181 Encounter for therapeutic drug level monitoring: Secondary | ICD-10-CM | POA: Diagnosis not present

## 2019-02-28 DIAGNOSIS — K208 Other esophagitis: Secondary | ICD-10-CM | POA: Diagnosis not present

## 2019-02-28 DIAGNOSIS — E039 Hypothyroidism, unspecified: Secondary | ICD-10-CM | POA: Diagnosis not present

## 2019-02-28 DIAGNOSIS — Z952 Presence of prosthetic heart valve: Secondary | ICD-10-CM | POA: Diagnosis not present

## 2019-02-28 DIAGNOSIS — Z5181 Encounter for therapeutic drug level monitoring: Secondary | ICD-10-CM | POA: Diagnosis not present

## 2019-02-28 DIAGNOSIS — Z7901 Long term (current) use of anticoagulants: Secondary | ICD-10-CM | POA: Diagnosis not present

## 2019-02-28 DIAGNOSIS — D500 Iron deficiency anemia secondary to blood loss (chronic): Secondary | ICD-10-CM | POA: Diagnosis not present

## 2019-02-28 DIAGNOSIS — N3090 Cystitis, unspecified without hematuria: Secondary | ICD-10-CM | POA: Diagnosis not present

## 2019-02-28 DIAGNOSIS — C3401 Malignant neoplasm of right main bronchus: Secondary | ICD-10-CM | POA: Diagnosis not present

## 2019-03-08 DIAGNOSIS — Z7901 Long term (current) use of anticoagulants: Secondary | ICD-10-CM | POA: Diagnosis not present

## 2019-03-08 DIAGNOSIS — D500 Iron deficiency anemia secondary to blood loss (chronic): Secondary | ICD-10-CM | POA: Diagnosis not present

## 2019-03-08 DIAGNOSIS — N3090 Cystitis, unspecified without hematuria: Secondary | ICD-10-CM | POA: Diagnosis not present

## 2019-03-08 DIAGNOSIS — R0683 Snoring: Secondary | ICD-10-CM | POA: Diagnosis not present

## 2019-03-08 DIAGNOSIS — C3401 Malignant neoplasm of right main bronchus: Secondary | ICD-10-CM | POA: Diagnosis not present

## 2019-03-08 DIAGNOSIS — J449 Chronic obstructive pulmonary disease, unspecified: Secondary | ICD-10-CM | POA: Diagnosis not present

## 2019-03-08 DIAGNOSIS — J9859 Other diseases of mediastinum, not elsewhere classified: Secondary | ICD-10-CM | POA: Diagnosis not present

## 2019-03-08 DIAGNOSIS — Z5181 Encounter for therapeutic drug level monitoring: Secondary | ICD-10-CM | POA: Diagnosis not present

## 2019-03-09 NOTE — Procedures (Signed)
   [place] A. Merlene Laughter, MD     [email or web address]             NOCTURNAL POLYSOMNOGRAPHY   LOCATION: [facility]   POOR QUALITY STUDY. SLEEP TEST NEEDS REPEATING SINCE    Delano Metz, MD Diplomate, American Board of Sleep Medicine. ELECTRONICALLY SIGNED ON:  03/09/2019, 10:24 AM [facility] PH: [phone]   FX: [phone] [place]

## 2019-03-10 DIAGNOSIS — D519 Vitamin B12 deficiency anemia, unspecified: Secondary | ICD-10-CM | POA: Diagnosis not present

## 2019-03-10 DIAGNOSIS — Z681 Body mass index (BMI) 19 or less, adult: Secondary | ICD-10-CM | POA: Diagnosis not present

## 2019-03-15 DIAGNOSIS — N3090 Cystitis, unspecified without hematuria: Secondary | ICD-10-CM | POA: Diagnosis not present

## 2019-03-15 DIAGNOSIS — D500 Iron deficiency anemia secondary to blood loss (chronic): Secondary | ICD-10-CM | POA: Diagnosis not present

## 2019-03-15 DIAGNOSIS — Z5181 Encounter for therapeutic drug level monitoring: Secondary | ICD-10-CM | POA: Diagnosis not present

## 2019-03-17 DIAGNOSIS — D500 Iron deficiency anemia secondary to blood loss (chronic): Secondary | ICD-10-CM | POA: Diagnosis not present

## 2019-03-17 DIAGNOSIS — Z5181 Encounter for therapeutic drug level monitoring: Secondary | ICD-10-CM | POA: Diagnosis not present

## 2019-03-17 DIAGNOSIS — N3090 Cystitis, unspecified without hematuria: Secondary | ICD-10-CM | POA: Diagnosis not present

## 2019-03-22 DIAGNOSIS — Z5181 Encounter for therapeutic drug level monitoring: Secondary | ICD-10-CM | POA: Diagnosis not present

## 2019-03-22 DIAGNOSIS — N3090 Cystitis, unspecified without hematuria: Secondary | ICD-10-CM | POA: Diagnosis not present

## 2019-03-22 DIAGNOSIS — D500 Iron deficiency anemia secondary to blood loss (chronic): Secondary | ICD-10-CM | POA: Diagnosis not present

## 2019-03-24 ENCOUNTER — Ambulatory Visit: Payer: Medicare Other | Attending: Neurology | Admitting: Neurology

## 2019-03-24 ENCOUNTER — Other Ambulatory Visit: Payer: Self-pay

## 2019-03-24 DIAGNOSIS — G4733 Obstructive sleep apnea (adult) (pediatric): Secondary | ICD-10-CM

## 2019-03-24 DIAGNOSIS — Z7982 Long term (current) use of aspirin: Secondary | ICD-10-CM | POA: Insufficient documentation

## 2019-03-24 DIAGNOSIS — Z7901 Long term (current) use of anticoagulants: Secondary | ICD-10-CM | POA: Insufficient documentation

## 2019-03-24 DIAGNOSIS — R0683 Snoring: Secondary | ICD-10-CM | POA: Insufficient documentation

## 2019-03-24 DIAGNOSIS — R5383 Other fatigue: Secondary | ICD-10-CM | POA: Insufficient documentation

## 2019-03-24 DIAGNOSIS — Z79899 Other long term (current) drug therapy: Secondary | ICD-10-CM | POA: Insufficient documentation

## 2019-03-24 DIAGNOSIS — G4739 Other sleep apnea: Secondary | ICD-10-CM | POA: Insufficient documentation

## 2019-03-29 DIAGNOSIS — Z20828 Contact with and (suspected) exposure to other viral communicable diseases: Secondary | ICD-10-CM | POA: Diagnosis not present

## 2019-03-29 DIAGNOSIS — Z01812 Encounter for preprocedural laboratory examination: Secondary | ICD-10-CM | POA: Diagnosis not present

## 2019-03-31 DIAGNOSIS — I252 Old myocardial infarction: Secondary | ICD-10-CM | POA: Diagnosis not present

## 2019-03-31 DIAGNOSIS — Z7901 Long term (current) use of anticoagulants: Secondary | ICD-10-CM | POA: Diagnosis not present

## 2019-03-31 DIAGNOSIS — C3490 Malignant neoplasm of unspecified part of unspecified bronchus or lung: Secondary | ICD-10-CM | POA: Diagnosis not present

## 2019-03-31 DIAGNOSIS — Z7982 Long term (current) use of aspirin: Secondary | ICD-10-CM | POA: Diagnosis not present

## 2019-03-31 DIAGNOSIS — K219 Gastro-esophageal reflux disease without esophagitis: Secondary | ICD-10-CM | POA: Diagnosis not present

## 2019-03-31 DIAGNOSIS — Z952 Presence of prosthetic heart valve: Secondary | ICD-10-CM | POA: Diagnosis not present

## 2019-03-31 DIAGNOSIS — Z79899 Other long term (current) drug therapy: Secondary | ICD-10-CM | POA: Diagnosis not present

## 2019-03-31 DIAGNOSIS — K222 Esophageal obstruction: Secondary | ICD-10-CM | POA: Diagnosis not present

## 2019-03-31 DIAGNOSIS — R1310 Dysphagia, unspecified: Secondary | ICD-10-CM | POA: Diagnosis not present

## 2019-03-31 DIAGNOSIS — K9289 Other specified diseases of the digestive system: Secondary | ICD-10-CM | POA: Diagnosis not present

## 2019-03-31 DIAGNOSIS — Z87891 Personal history of nicotine dependence: Secondary | ICD-10-CM | POA: Diagnosis not present

## 2019-03-31 DIAGNOSIS — J439 Emphysema, unspecified: Secondary | ICD-10-CM | POA: Diagnosis not present

## 2019-03-31 DIAGNOSIS — E039 Hypothyroidism, unspecified: Secondary | ICD-10-CM | POA: Diagnosis not present

## 2019-04-05 DIAGNOSIS — C3401 Malignant neoplasm of right main bronchus: Secondary | ICD-10-CM | POA: Diagnosis not present

## 2019-04-05 DIAGNOSIS — D500 Iron deficiency anemia secondary to blood loss (chronic): Secondary | ICD-10-CM | POA: Diagnosis not present

## 2019-04-05 DIAGNOSIS — Z7901 Long term (current) use of anticoagulants: Secondary | ICD-10-CM | POA: Diagnosis not present

## 2019-04-05 DIAGNOSIS — Z952 Presence of prosthetic heart valve: Secondary | ICD-10-CM | POA: Diagnosis not present

## 2019-04-05 DIAGNOSIS — K222 Esophageal obstruction: Secondary | ICD-10-CM | POA: Diagnosis not present

## 2019-04-07 DIAGNOSIS — D519 Vitamin B12 deficiency anemia, unspecified: Secondary | ICD-10-CM | POA: Diagnosis not present

## 2019-04-07 DIAGNOSIS — Z681 Body mass index (BMI) 19 or less, adult: Secondary | ICD-10-CM | POA: Diagnosis not present

## 2019-04-07 DIAGNOSIS — Z23 Encounter for immunization: Secondary | ICD-10-CM | POA: Diagnosis not present

## 2019-04-09 NOTE — Procedures (Signed)
    [place] A. Merlene Laughter, MD     [email or web address]             HOME SLEEP STUDY  LOCATION: [facility]   Patient Name: Judy Sawyer, Judy Sawyer Date: 03/24/2019 Gender: Female D.O.B: Aug 19, 1955 Age (years): 49 Referring Provider: Wanita Chamberlain MD Height (inches): 48 Interpreting Physician: Phillips Odor MD, ABSM Weight (lbs): 102 RPSGT: Rosebud Poles BMI: 18 MRN: [id number] Neck Size: CLINICAL INFORMATION Sleep Study Type: HST     Indication for sleep study: Fatigue     Epworth Sleepiness Score: NA  SLEEP STUDY TECHNIQUE A multi-channel overnight portable sleep study was performed. The channels recorded were: nasal airflow, thoracic respiratory movement, and oxygen saturation with a pulse oximetry. Snoring was also monitored.  MEDICATIONS Patient self administered medications include: N/A.  Current Outpatient Medications:  .  aspirin EC 81 MG tablet, Take 81 mg by mouth daily., Disp: , Rfl:  .  calcium carbonate (CALTRATE 600) 1500 (600 Ca) MG TABS tablet, Take 1 tablet by mouth daily with breakfast., Disp: , Rfl:  .  Cholecalciferol (VITAMIN D3) 1000 units CAPS, Take 1 capsule by mouth daily., Disp: , Rfl:  .  enoxaparin (LOVENOX) 40 MG/0.4ML injection, Inject 40 mg into the skin 2 (two) times daily., Disp: , Rfl: 1 .  fluticasone (FLONASE) 50 MCG/ACT nasal spray, Place into both nostrils as needed for allergies or rhinitis., Disp: , Rfl:  .  HYDROcodone-acetaminophen (NORCO) 7.5-325 MG tablet, Take 1 tablet by mouth daily., Disp: , Rfl:  .  levothyroxine (SYNTHROID) 100 MCG tablet, Take 100 mcg by mouth daily., Disp: , Rfl:  .  Melatonin 5 MG TABS, Take 1 tablet by mouth daily., Disp: , Rfl:  .  metoprolol tartrate (LOPRESSOR) 25 MG tablet, Take 1 tablet (25 mg total) by mouth 2 (two) times daily., Disp: 180 tablet, Rfl: 1 .  pantoprazole (PROTONIX) 40 MG tablet, Take 40 mg by mouth 2 (two) times daily. , Disp: , Rfl:  .  prochlorperazine  (COMPAZINE) 10 MG tablet, Take 10 mg by mouth every 6 (six) hours as needed for nausea or vomiting., Disp: , Rfl:  .  Sennosides 8.6 MG CAPS, Take 1 capsule by mouth daily as needed. , Disp: , Rfl:   SLEEP ARCHITECTURE Patient was studied for 643.1 minutes. The sleep efficiency was 97.4 % and the patient was supine for 50.4%. The arousal index was 0.0 per hour.  RESPIRATORY PARAMETERS The overall AHI was 3.1 per hour, with a central apnea index of 0.5 per hour.  The oxygen nadir was 73% during sleep.     CARDIAC DATA Mean heart rate during sleep was 80.0 bpm.  IMPRESSIONS No significant obstructive sleep apnea occurred during this study (AHI = 3.1/h).  Delano Metz, MD Diplomate, American Board of Sleep Medicine. ELECTRONICALLY SIGNED ON:  04/09/2019, 10:05 PM [place] PH: [phone]   FX: [phone] [place]

## 2019-04-14 DIAGNOSIS — R0602 Shortness of breath: Secondary | ICD-10-CM | POA: Diagnosis not present

## 2019-04-14 DIAGNOSIS — I2510 Atherosclerotic heart disease of native coronary artery without angina pectoris: Secondary | ICD-10-CM | POA: Diagnosis not present

## 2019-04-14 DIAGNOSIS — J449 Chronic obstructive pulmonary disease, unspecified: Secondary | ICD-10-CM | POA: Diagnosis not present

## 2019-04-14 DIAGNOSIS — R609 Edema, unspecified: Secondary | ICD-10-CM | POA: Diagnosis not present

## 2019-04-14 DIAGNOSIS — Z681 Body mass index (BMI) 19 or less, adult: Secondary | ICD-10-CM | POA: Diagnosis not present

## 2019-04-19 DIAGNOSIS — D500 Iron deficiency anemia secondary to blood loss (chronic): Secondary | ICD-10-CM | POA: Diagnosis not present

## 2019-04-19 DIAGNOSIS — Z7901 Long term (current) use of anticoagulants: Secondary | ICD-10-CM | POA: Diagnosis not present

## 2019-04-19 DIAGNOSIS — C3401 Malignant neoplasm of right main bronchus: Secondary | ICD-10-CM | POA: Diagnosis not present

## 2019-04-27 DIAGNOSIS — D500 Iron deficiency anemia secondary to blood loss (chronic): Secondary | ICD-10-CM | POA: Diagnosis not present

## 2019-04-27 DIAGNOSIS — Z7901 Long term (current) use of anticoagulants: Secondary | ICD-10-CM | POA: Diagnosis not present

## 2019-04-27 DIAGNOSIS — C3401 Malignant neoplasm of right main bronchus: Secondary | ICD-10-CM | POA: Diagnosis not present

## 2019-04-27 DIAGNOSIS — R0683 Snoring: Secondary | ICD-10-CM | POA: Diagnosis not present

## 2019-04-27 DIAGNOSIS — Z681 Body mass index (BMI) 19 or less, adult: Secondary | ICD-10-CM | POA: Diagnosis not present

## 2019-04-27 DIAGNOSIS — Z952 Presence of prosthetic heart valve: Secondary | ICD-10-CM | POA: Diagnosis not present

## 2019-04-27 DIAGNOSIS — K222 Esophageal obstruction: Secondary | ICD-10-CM | POA: Diagnosis not present

## 2019-04-27 DIAGNOSIS — E039 Hypothyroidism, unspecified: Secondary | ICD-10-CM | POA: Diagnosis not present

## 2019-05-09 DIAGNOSIS — D519 Vitamin B12 deficiency anemia, unspecified: Secondary | ICD-10-CM | POA: Diagnosis not present

## 2019-05-18 DIAGNOSIS — Z7901 Long term (current) use of anticoagulants: Secondary | ICD-10-CM | POA: Diagnosis not present

## 2019-05-18 DIAGNOSIS — C3401 Malignant neoplasm of right main bronchus: Secondary | ICD-10-CM | POA: Diagnosis not present

## 2019-05-18 DIAGNOSIS — D500 Iron deficiency anemia secondary to blood loss (chronic): Secondary | ICD-10-CM | POA: Diagnosis not present

## 2019-05-25 DIAGNOSIS — Z681 Body mass index (BMI) 19 or less, adult: Secondary | ICD-10-CM | POA: Diagnosis not present

## 2019-05-25 DIAGNOSIS — Z7901 Long term (current) use of anticoagulants: Secondary | ICD-10-CM | POA: Diagnosis not present

## 2019-05-25 DIAGNOSIS — C3401 Malignant neoplasm of right main bronchus: Secondary | ICD-10-CM | POA: Diagnosis not present

## 2019-05-25 DIAGNOSIS — Z952 Presence of prosthetic heart valve: Secondary | ICD-10-CM | POA: Diagnosis not present

## 2019-05-25 DIAGNOSIS — D500 Iron deficiency anemia secondary to blood loss (chronic): Secondary | ICD-10-CM | POA: Diagnosis not present

## 2019-05-25 DIAGNOSIS — M818 Other osteoporosis without current pathological fracture: Secondary | ICD-10-CM | POA: Diagnosis not present

## 2019-06-02 DIAGNOSIS — D500 Iron deficiency anemia secondary to blood loss (chronic): Secondary | ICD-10-CM | POA: Diagnosis not present

## 2019-06-07 DIAGNOSIS — D500 Iron deficiency anemia secondary to blood loss (chronic): Secondary | ICD-10-CM | POA: Diagnosis not present

## 2019-06-08 DIAGNOSIS — Z23 Encounter for immunization: Secondary | ICD-10-CM | POA: Diagnosis not present

## 2019-06-21 DIAGNOSIS — R05 Cough: Secondary | ICD-10-CM | POA: Diagnosis not present

## 2019-06-21 DIAGNOSIS — Z681 Body mass index (BMI) 19 or less, adult: Secondary | ICD-10-CM | POA: Diagnosis not present

## 2019-06-21 DIAGNOSIS — D500 Iron deficiency anemia secondary to blood loss (chronic): Secondary | ICD-10-CM | POA: Diagnosis not present

## 2019-06-21 DIAGNOSIS — C3401 Malignant neoplasm of right main bronchus: Secondary | ICD-10-CM | POA: Diagnosis not present

## 2019-06-21 DIAGNOSIS — C3490 Malignant neoplasm of unspecified part of unspecified bronchus or lung: Secondary | ICD-10-CM | POA: Diagnosis not present

## 2019-06-21 DIAGNOSIS — Z7901 Long term (current) use of anticoagulants: Secondary | ICD-10-CM | POA: Diagnosis not present

## 2019-06-23 ENCOUNTER — Encounter: Payer: Self-pay | Admitting: *Deleted

## 2019-06-23 ENCOUNTER — Telehealth (INDEPENDENT_AMBULATORY_CARE_PROVIDER_SITE_OTHER): Payer: Medicare Other | Admitting: Cardiovascular Disease

## 2019-06-23 ENCOUNTER — Encounter: Payer: Self-pay | Admitting: Cardiovascular Disease

## 2019-06-23 VITALS — Ht 63.0 in | Wt 106.0 lb

## 2019-06-23 DIAGNOSIS — I7121 Aneurysm of the ascending aorta, without rupture: Secondary | ICD-10-CM

## 2019-06-23 DIAGNOSIS — I10 Essential (primary) hypertension: Secondary | ICD-10-CM

## 2019-06-23 DIAGNOSIS — I712 Thoracic aortic aneurysm, without rupture: Secondary | ICD-10-CM

## 2019-06-23 DIAGNOSIS — Z952 Presence of prosthetic heart valve: Secondary | ICD-10-CM | POA: Diagnosis not present

## 2019-06-23 NOTE — Patient Instructions (Signed)
Medication Instructions:  Your physician recommends that you continue on your current medications as directed. Please refer to the Current Medication list given to you today.  *If you need a refill on your cardiac medications before your next appointment, please call your pharmacy*  Lab Work: NONE   If you have labs (blood work) drawn today and your tests are completely normal, you will receive your results only by: Marland Kitchen MyChart Message (if you have MyChart) OR . A paper copy in the mail If you have any lab test that is abnormal or we need to change your treatment, we will call you to review the results.  Testing/Procedures: NONE   Follow-Up: At [facility], you and your health needs are our priority.  As part of our continuing mission to provide you with exceptional heart care, we have created designated Provider Care Teams.  These Care Teams include your primary Cardiologist (physician) and Advanced Practice Providers (APPs -  Physician Assistants and Nurse Practitioners) who all work together to provide you with the care you need, when you need it.  Your next appointment:   1 year(s)  The format for your next appointment:   In Person  Provider:   Kate Sable, MD  Other Instructions Thank you for choosing [facility]!

## 2019-06-23 NOTE — Progress Notes (Signed)
Virtual Visit via Telephone Note   This visit type was conducted due to national recommendations for restrictions regarding the COVID-19 Pandemic (e.g. social distancing) in an effort to limit this patient's exposure and mitigate transmission in our community.  Due to her co-morbid illnesses, this patient is at least at moderate risk for complications without adequate follow up.  This format is felt to be most appropriate for this patient at this time.  The patient did not have access to video technology/had technical difficulties with video requiring transitioning to audio format only (telephone).  All issues noted in this document were discussed and addressed.  No physical exam could be performed with this format.  Please refer to the patient's chart for her  consent to telehealth for [facility].   Date:  06/23/2019   ID:  Judy Sawyer, DOB 10/25/1955, MRN [id number]  Patient Location: Home Provider Location: Office  PCP:  Denny Levy, [place]  Cardiologist:  Kate Sable, MD  Electrophysiologist:  None   Evaluation Performed:  Follow-Up Visit  Chief Complaint:  Mechanical aortic valve  History of Present Illness:    Judy Sawyer is a 63 y.o. female with a history of mechanical aortic valve replacement performed in [place].  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  She also has an ascending thoracic aortic aneurysm.  She denies chest pain.  She has a history of COPD.  She has a prior history of 18 esophageal dilatations.  I reviewed notes from her radiation oncologist dated 04/27/2019.  She apparently developed "clinical evidence of CHF ".  She apparently had some lower extremity swelling which was evaluated by her PCP.  She is not currently on a diuretic.  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  She denies chest pain. She has  chronic back pain. Chronic exertional dyspnea is stable.  She is to undergo esophageal dilatation next Tuesday.   Past Medical History:  Diagnosis Date  . Acute bronchitis   . Anemia, unspecified   . Aortic stenosis    previous cardiologisit: Dr. Remigio Eisenmenger in [place], [place]  . Carotid stenosis 05/13   mild less than 50% bilaterally  . Chest pain   . COPD (chronic obstructive pulmonary disease) ([place])   . Fibromyalgia   . GERD (gastroesophageal reflux disease)   . Murmur   . Tobacco abuse    Past Surgical History:  Procedure Laterality Date  . ABDOMINAL HYSTERECTOMY    . AORTIC VALVE REPLACEMENT  11/2011   in [place]. St. Jude 19 mm  . COLONOSCOPY    . KNEE ARTHROSCOPY WITH LATERAL MENISECTOMY Left 08/16/2014   Procedure: LEFT KNEE ARTHROSCOPY WITH LATERAL MENISECTOMY;  Surgeon: Lorn Junes, MD;  Location: [place];  Service: Orthopedics;  Laterality: Left;  . KNEE ARTHROSCOPY WITH MEDIAL MENISECTOMY Left 08/16/2014   Procedure: KNEE ARTHROSCOPY WITH MEDIAL MENISECTOMY;  Surgeon: Lorn Junes, MD;  Location: [place];  Service: Orthopedics;  Laterality: Left;  . TONSILLECTOMY    . UPPER GI ENDOSCOPY       Current Meds  Medication Sig  . aspirin EC 81 MG tablet Take 81 mg by mouth daily.  . calcium carbonate (CALTRATE 600) 1500 (600 Ca) MG TABS tablet Take 1 tablet by mouth daily with breakfast.  . Cholecalciferol (VITAMIN D3) 1000 units CAPS Take 1 capsule by  mouth daily.  Marland Kitchen enoxaparin (LOVENOX) 40 MG/0.4ML injection Inject 40 mg into the skin 2 (two) times daily.  . fluticasone (FLONASE) 50 MCG/ACT nasal spray Place into both nostrils as needed for allergies or rhinitis.  Marland Kitchen HYDROcodone-acetaminophen (NORCO) 7.5-325 MG tablet Take 1 tablet by mouth daily.  Marland Kitchen levothyroxine (SYNTHROID) 100 MCG tablet Take 100 mcg by mouth daily.  . Melatonin 5 MG TABS Take 1 tablet by mouth daily.  . metoprolol tartrate (LOPRESSOR) 25 MG tablet Take 1  tablet (25 mg total) by mouth 2 (two) times daily.  . pantoprazole (PROTONIX) 40 MG tablet Take 40 mg by mouth 2 (two) times daily.   . prochlorperazine (COMPAZINE) 10 MG tablet Take 10 mg by mouth every 6 (six) hours as needed for nausea or vomiting.  . Sennosides 8.6 MG CAPS Take 1 capsule by mouth daily as needed.      Allergies:   Codeine   Social History   Tobacco Use  . Smoking status: Former Smoker    Packs/day: 0.50    Years: 42.00    Pack years: 21.00    Types: Cigarettes    Start date: 07/14/1969    Quit date: 07/29/2017    Years since quitting: 1.9  . Smokeless tobacco: Never Used  Substance Use Topics  . Alcohol use: No    Alcohol/week: 0.0 standard drinks  . Drug use: No     Family Hx: The patient's family history includes Cancer in her mother; Diabetes type II in her sister; Emphysema in her father; Heart attack in her mother; Heart disease in her sister; Liver disease in her father; Rheumatic fever in her sister.  ROS:   Please see the history of present illness.     All other systems reviewed and are negative.   Prior CV studies:   The following studies were reviewed today:  NA  Labs/Other Tests and Data Reviewed:    EKG:  No ECG reviewed.  Recent Labs: No results found for requested labs within last 8760 hours.   Recent Lipid Panel No results found for: CHOL, TRIG, HDL, CHOLHDL, LDLCALC, LDLDIRECT  Wt Readings from Last 3 Encounters:  06/23/19 106 lb (48.1 kg)  01/20/19 108 lb 12.8 oz (49.4 kg)  04/15/18 102 lb 6.4 oz (46.4 kg)     Objective:    Vital Signs:  Ht 5\' 3"  (1.6 m)   Wt 106 lb (48.1 kg)   BMI 18.78 kg/m    VITAL SIGNS:  reviewed  ASSESSMENT & PLAN:    1.  Ascending thoracic aortic aneurysm -PET scan in October 2019 demonstrated a 4.4 cm ascending thoracic aortic aneurysm.  She underwent thoracic imaging in April 2020 [facility] which showed a 4.1 cm aneurysm. She apparently underwent another chest CT in October at [facility].  I will have to request these results.  2.  Mechanical Saint Jude aortic valve -Functioning normally by echocardiogram in September 2018.  She is on Lovenox as she is apparently a slow metabolizer of warfarin.  This is managed by hematology at [facility].  She underwent aortic valve replacement in [place].  She had no significant coronary artery disease on preoperative cardiac catheterization.   3.  Hypertension -No changes to therapy.    COVID-19 Education: The signs and symptoms of COVID-19 were discussed with the patient and how to seek care for testing (follow up with PCP or arrange E-visit).  The importance of social distancing was discussed today.  Time:   Today, I have  spent 15 minutes with the patient with telehealth technology discussing the above problems.     Medication Adjustments/Labs and Tests Ordered: Current medicines are reviewed at length with the patient today.  Concerns regarding medicines are outlined above.   Tests Ordered: No orders of the defined types were placed in this encounter.   Medication Changes: No orders of the defined types were placed in this encounter.   Follow Up:  Virtual Visit  in 1 year(s)  Signed, Kate Sable, MD  06/23/2019 12:42 PM    [place]

## 2019-06-24 DIAGNOSIS — Z01818 Encounter for other preprocedural examination: Secondary | ICD-10-CM | POA: Diagnosis not present

## 2019-06-28 DIAGNOSIS — K222 Esophageal obstruction: Secondary | ICD-10-CM | POA: Diagnosis not present

## 2019-06-28 DIAGNOSIS — R1310 Dysphagia, unspecified: Secondary | ICD-10-CM | POA: Diagnosis not present

## 2019-07-05 DIAGNOSIS — R0981 Nasal congestion: Secondary | ICD-10-CM | POA: Diagnosis not present

## 2019-07-05 DIAGNOSIS — C3401 Malignant neoplasm of right main bronchus: Secondary | ICD-10-CM | POA: Diagnosis not present

## 2019-07-05 DIAGNOSIS — D500 Iron deficiency anemia secondary to blood loss (chronic): Secondary | ICD-10-CM | POA: Diagnosis not present

## 2019-07-05 DIAGNOSIS — Z7901 Long term (current) use of anticoagulants: Secondary | ICD-10-CM | POA: Diagnosis not present

## 2019-07-05 DIAGNOSIS — S22000A Wedge compression fracture of unspecified thoracic vertebra, initial encounter for closed fracture: Secondary | ICD-10-CM | POA: Diagnosis not present

## 2019-07-18 DIAGNOSIS — D500 Iron deficiency anemia secondary to blood loss (chronic): Secondary | ICD-10-CM | POA: Diagnosis not present

## 2019-07-18 DIAGNOSIS — C3401 Malignant neoplasm of right main bronchus: Secondary | ICD-10-CM | POA: Diagnosis not present

## 2019-07-18 DIAGNOSIS — Z7901 Long term (current) use of anticoagulants: Secondary | ICD-10-CM | POA: Diagnosis not present

## 2019-07-21 DIAGNOSIS — R0981 Nasal congestion: Secondary | ICD-10-CM | POA: Diagnosis not present

## 2019-07-21 DIAGNOSIS — R05 Cough: Secondary | ICD-10-CM | POA: Diagnosis not present

## 2019-07-21 DIAGNOSIS — C3401 Malignant neoplasm of right main bronchus: Secondary | ICD-10-CM | POA: Diagnosis not present

## 2019-07-21 DIAGNOSIS — D500 Iron deficiency anemia secondary to blood loss (chronic): Secondary | ICD-10-CM | POA: Diagnosis not present

## 2019-07-21 DIAGNOSIS — Z7901 Long term (current) use of anticoagulants: Secondary | ICD-10-CM | POA: Diagnosis not present

## 2019-07-26 DIAGNOSIS — I700 Atherosclerosis of aorta: Secondary | ICD-10-CM | POA: Diagnosis not present

## 2019-07-26 DIAGNOSIS — J449 Chronic obstructive pulmonary disease, unspecified: Secondary | ICD-10-CM | POA: Diagnosis not present

## 2019-07-26 DIAGNOSIS — I719 Aortic aneurysm of unspecified site, without rupture: Secondary | ICD-10-CM | POA: Diagnosis not present

## 2019-07-26 DIAGNOSIS — C3401 Malignant neoplasm of right main bronchus: Secondary | ICD-10-CM | POA: Diagnosis not present

## 2019-07-26 DIAGNOSIS — J432 Centrilobular emphysema: Secondary | ICD-10-CM | POA: Diagnosis not present

## 2019-07-26 DIAGNOSIS — J439 Emphysema, unspecified: Secondary | ICD-10-CM | POA: Diagnosis not present

## 2019-07-27 DIAGNOSIS — T66XXXA Radiation sickness, unspecified, initial encounter: Secondary | ICD-10-CM | POA: Diagnosis not present

## 2019-07-27 DIAGNOSIS — M818 Other osteoporosis without current pathological fracture: Secondary | ICD-10-CM | POA: Diagnosis not present

## 2019-07-27 DIAGNOSIS — Z952 Presence of prosthetic heart valve: Secondary | ICD-10-CM | POA: Diagnosis not present

## 2019-07-27 DIAGNOSIS — J323 Chronic sphenoidal sinusitis: Secondary | ICD-10-CM | POA: Diagnosis not present

## 2019-07-27 DIAGNOSIS — D500 Iron deficiency anemia secondary to blood loss (chronic): Secondary | ICD-10-CM | POA: Diagnosis not present

## 2019-07-27 DIAGNOSIS — C3401 Malignant neoplasm of right main bronchus: Secondary | ICD-10-CM | POA: Diagnosis not present

## 2019-07-27 DIAGNOSIS — K208 Other esophagitis without bleeding: Secondary | ICD-10-CM | POA: Diagnosis not present

## 2019-07-27 DIAGNOSIS — Z7901 Long term (current) use of anticoagulants: Secondary | ICD-10-CM | POA: Diagnosis not present

## 2019-07-27 DIAGNOSIS — R0981 Nasal congestion: Secondary | ICD-10-CM | POA: Diagnosis not present

## 2019-07-27 DIAGNOSIS — E039 Hypothyroidism, unspecified: Secondary | ICD-10-CM | POA: Diagnosis not present

## 2019-08-02 DIAGNOSIS — S22000D Wedge compression fracture of unspecified thoracic vertebra, subsequent encounter for fracture with routine healing: Secondary | ICD-10-CM | POA: Diagnosis not present

## 2019-08-02 DIAGNOSIS — J439 Emphysema, unspecified: Secondary | ICD-10-CM | POA: Diagnosis not present

## 2019-08-02 DIAGNOSIS — M546 Pain in thoracic spine: Secondary | ICD-10-CM | POA: Diagnosis not present

## 2019-08-02 DIAGNOSIS — I252 Old myocardial infarction: Secondary | ICD-10-CM | POA: Diagnosis not present

## 2019-08-02 DIAGNOSIS — I2510 Atherosclerotic heart disease of native coronary artery without angina pectoris: Secondary | ICD-10-CM | POA: Diagnosis not present

## 2019-08-02 DIAGNOSIS — Z952 Presence of prosthetic heart valve: Secondary | ICD-10-CM | POA: Diagnosis not present

## 2019-08-02 DIAGNOSIS — R918 Other nonspecific abnormal finding of lung field: Secondary | ICD-10-CM | POA: Diagnosis not present

## 2019-08-02 DIAGNOSIS — M40204 Unspecified kyphosis, thoracic region: Secondary | ICD-10-CM | POA: Diagnosis not present

## 2019-08-02 DIAGNOSIS — R29890 Loss of height: Secondary | ICD-10-CM | POA: Diagnosis not present

## 2019-08-02 DIAGNOSIS — Z87891 Personal history of nicotine dependence: Secondary | ICD-10-CM | POA: Diagnosis not present

## 2019-08-02 DIAGNOSIS — G8929 Other chronic pain: Secondary | ICD-10-CM | POA: Diagnosis not present

## 2019-08-02 DIAGNOSIS — S22000A Wedge compression fracture of unspecified thoracic vertebra, initial encounter for closed fracture: Secondary | ICD-10-CM | POA: Diagnosis not present

## 2019-08-09 DIAGNOSIS — M546 Pain in thoracic spine: Secondary | ICD-10-CM | POA: Diagnosis not present

## 2019-08-10 ENCOUNTER — Other Ambulatory Visit: Payer: Self-pay | Admitting: Cardiovascular Disease

## 2019-08-12 DIAGNOSIS — E538 Deficiency of other specified B group vitamins: Secondary | ICD-10-CM | POA: Diagnosis not present

## 2019-08-17 DIAGNOSIS — M546 Pain in thoracic spine: Secondary | ICD-10-CM | POA: Diagnosis not present

## 2019-08-24 DIAGNOSIS — M546 Pain in thoracic spine: Secondary | ICD-10-CM | POA: Diagnosis not present

## 2019-08-25 DIAGNOSIS — Z23 Encounter for immunization: Secondary | ICD-10-CM | POA: Diagnosis not present

## 2019-08-26 DIAGNOSIS — Z7901 Long term (current) use of anticoagulants: Secondary | ICD-10-CM | POA: Diagnosis not present

## 2019-08-26 DIAGNOSIS — R042 Hemoptysis: Secondary | ICD-10-CM | POA: Diagnosis not present

## 2019-08-26 DIAGNOSIS — R05 Cough: Secondary | ICD-10-CM | POA: Diagnosis not present

## 2019-08-29 DIAGNOSIS — R350 Frequency of micturition: Secondary | ICD-10-CM | POA: Diagnosis not present

## 2019-08-29 DIAGNOSIS — Z681 Body mass index (BMI) 19 or less, adult: Secondary | ICD-10-CM | POA: Diagnosis not present

## 2019-08-29 DIAGNOSIS — Z7901 Long term (current) use of anticoagulants: Secondary | ICD-10-CM | POA: Diagnosis not present

## 2019-08-29 DIAGNOSIS — D500 Iron deficiency anemia secondary to blood loss (chronic): Secondary | ICD-10-CM | POA: Diagnosis not present

## 2019-08-29 DIAGNOSIS — R042 Hemoptysis: Secondary | ICD-10-CM | POA: Diagnosis not present

## 2019-08-29 DIAGNOSIS — C3401 Malignant neoplasm of right main bronchus: Secondary | ICD-10-CM | POA: Diagnosis not present

## 2019-08-29 DIAGNOSIS — R05 Cough: Secondary | ICD-10-CM | POA: Diagnosis not present

## 2019-08-31 DIAGNOSIS — M546 Pain in thoracic spine: Secondary | ICD-10-CM | POA: Diagnosis not present

## 2019-09-05 DIAGNOSIS — Z7901 Long term (current) use of anticoagulants: Secondary | ICD-10-CM | POA: Diagnosis not present

## 2019-09-05 DIAGNOSIS — R0981 Nasal congestion: Secondary | ICD-10-CM | POA: Diagnosis not present

## 2019-09-05 DIAGNOSIS — Z952 Presence of prosthetic heart valve: Secondary | ICD-10-CM | POA: Diagnosis not present

## 2019-09-05 DIAGNOSIS — C3401 Malignant neoplasm of right main bronchus: Secondary | ICD-10-CM | POA: Diagnosis not present

## 2019-09-05 DIAGNOSIS — S22000A Wedge compression fracture of unspecified thoracic vertebra, initial encounter for closed fracture: Secondary | ICD-10-CM | POA: Diagnosis not present

## 2019-09-08 DIAGNOSIS — Z7901 Long term (current) use of anticoagulants: Secondary | ICD-10-CM | POA: Diagnosis not present

## 2019-09-08 DIAGNOSIS — M546 Pain in thoracic spine: Secondary | ICD-10-CM | POA: Diagnosis not present

## 2019-09-08 DIAGNOSIS — C3401 Malignant neoplasm of right main bronchus: Secondary | ICD-10-CM | POA: Diagnosis not present

## 2019-09-09 DIAGNOSIS — E538 Deficiency of other specified B group vitamins: Secondary | ICD-10-CM | POA: Diagnosis not present

## 2019-09-09 DIAGNOSIS — Z681 Body mass index (BMI) 19 or less, adult: Secondary | ICD-10-CM | POA: Diagnosis not present

## 2019-09-09 DIAGNOSIS — R042 Hemoptysis: Secondary | ICD-10-CM | POA: Diagnosis not present

## 2019-09-09 DIAGNOSIS — Z7901 Long term (current) use of anticoagulants: Secondary | ICD-10-CM | POA: Diagnosis not present

## 2019-09-12 DIAGNOSIS — Z5181 Encounter for therapeutic drug level monitoring: Secondary | ICD-10-CM | POA: Diagnosis not present

## 2019-09-15 DIAGNOSIS — M546 Pain in thoracic spine: Secondary | ICD-10-CM | POA: Diagnosis not present

## 2019-09-19 DIAGNOSIS — Z5181 Encounter for therapeutic drug level monitoring: Secondary | ICD-10-CM | POA: Diagnosis not present

## 2019-09-19 DIAGNOSIS — Z79899 Other long term (current) drug therapy: Secondary | ICD-10-CM | POA: Diagnosis not present

## 2019-09-21 DIAGNOSIS — Z5181 Encounter for therapeutic drug level monitoring: Secondary | ICD-10-CM | POA: Diagnosis not present

## 2019-09-21 DIAGNOSIS — Z79899 Other long term (current) drug therapy: Secondary | ICD-10-CM | POA: Diagnosis not present

## 2019-09-22 DIAGNOSIS — M546 Pain in thoracic spine: Secondary | ICD-10-CM | POA: Diagnosis not present

## 2019-09-23 DIAGNOSIS — Z23 Encounter for immunization: Secondary | ICD-10-CM | POA: Diagnosis not present

## 2019-09-23 DIAGNOSIS — Z01818 Encounter for other preprocedural examination: Secondary | ICD-10-CM | POA: Diagnosis not present

## 2019-09-27 DIAGNOSIS — M1990 Unspecified osteoarthritis, unspecified site: Secondary | ICD-10-CM | POA: Diagnosis not present

## 2019-09-27 DIAGNOSIS — I252 Old myocardial infarction: Secondary | ICD-10-CM | POA: Diagnosis not present

## 2019-09-27 DIAGNOSIS — R1310 Dysphagia, unspecified: Secondary | ICD-10-CM | POA: Diagnosis not present

## 2019-09-27 DIAGNOSIS — K449 Diaphragmatic hernia without obstruction or gangrene: Secondary | ICD-10-CM | POA: Diagnosis not present

## 2019-09-27 DIAGNOSIS — K222 Esophageal obstruction: Secondary | ICD-10-CM | POA: Diagnosis not present

## 2019-09-27 DIAGNOSIS — J439 Emphysema, unspecified: Secondary | ICD-10-CM | POA: Diagnosis not present

## 2019-09-27 DIAGNOSIS — I2510 Atherosclerotic heart disease of native coronary artery without angina pectoris: Secondary | ICD-10-CM | POA: Diagnosis not present

## 2019-09-27 DIAGNOSIS — Z952 Presence of prosthetic heart valve: Secondary | ICD-10-CM | POA: Diagnosis not present

## 2019-09-27 DIAGNOSIS — M810 Age-related osteoporosis without current pathological fracture: Secondary | ICD-10-CM | POA: Diagnosis not present

## 2019-09-27 DIAGNOSIS — E039 Hypothyroidism, unspecified: Secondary | ICD-10-CM | POA: Diagnosis not present

## 2019-09-27 DIAGNOSIS — Z923 Personal history of irradiation: Secondary | ICD-10-CM | POA: Diagnosis not present

## 2019-09-27 DIAGNOSIS — I10 Essential (primary) hypertension: Secondary | ICD-10-CM | POA: Diagnosis not present

## 2019-09-27 DIAGNOSIS — Z9221 Personal history of antineoplastic chemotherapy: Secondary | ICD-10-CM | POA: Diagnosis not present

## 2019-09-27 DIAGNOSIS — K219 Gastro-esophageal reflux disease without esophagitis: Secondary | ICD-10-CM | POA: Diagnosis not present

## 2019-10-03 DIAGNOSIS — D500 Iron deficiency anemia secondary to blood loss (chronic): Secondary | ICD-10-CM | POA: Diagnosis not present

## 2019-10-03 DIAGNOSIS — C3401 Malignant neoplasm of right main bronchus: Secondary | ICD-10-CM | POA: Diagnosis not present

## 2019-10-03 DIAGNOSIS — Z7901 Long term (current) use of anticoagulants: Secondary | ICD-10-CM | POA: Diagnosis not present

## 2019-10-04 DIAGNOSIS — M546 Pain in thoracic spine: Secondary | ICD-10-CM | POA: Diagnosis not present

## 2019-10-07 DIAGNOSIS — E538 Deficiency of other specified B group vitamins: Secondary | ICD-10-CM | POA: Diagnosis not present

## 2019-10-10 DIAGNOSIS — E039 Hypothyroidism, unspecified: Secondary | ICD-10-CM | POA: Diagnosis not present

## 2019-10-10 DIAGNOSIS — T66XXXA Radiation sickness, unspecified, initial encounter: Secondary | ICD-10-CM | POA: Diagnosis not present

## 2019-10-10 DIAGNOSIS — K208 Other esophagitis without bleeding: Secondary | ICD-10-CM | POA: Diagnosis not present

## 2019-10-10 DIAGNOSIS — D500 Iron deficiency anemia secondary to blood loss (chronic): Secondary | ICD-10-CM | POA: Diagnosis not present

## 2019-10-10 DIAGNOSIS — C3401 Malignant neoplasm of right main bronchus: Secondary | ICD-10-CM | POA: Diagnosis not present

## 2019-10-10 DIAGNOSIS — Z5181 Encounter for therapeutic drug level monitoring: Secondary | ICD-10-CM | POA: Diagnosis not present

## 2019-10-10 DIAGNOSIS — Z952 Presence of prosthetic heart valve: Secondary | ICD-10-CM | POA: Diagnosis not present

## 2019-10-10 DIAGNOSIS — M818 Other osteoporosis without current pathological fracture: Secondary | ICD-10-CM | POA: Diagnosis not present

## 2019-10-10 DIAGNOSIS — Z7901 Long term (current) use of anticoagulants: Secondary | ICD-10-CM | POA: Diagnosis not present

## 2019-10-13 DIAGNOSIS — M546 Pain in thoracic spine: Secondary | ICD-10-CM | POA: Diagnosis not present

## 2019-10-17 DIAGNOSIS — Z7901 Long term (current) use of anticoagulants: Secondary | ICD-10-CM | POA: Diagnosis not present

## 2019-10-17 DIAGNOSIS — M546 Pain in thoracic spine: Secondary | ICD-10-CM | POA: Diagnosis not present

## 2019-10-24 DIAGNOSIS — C3401 Malignant neoplasm of right main bronchus: Secondary | ICD-10-CM | POA: Diagnosis not present

## 2019-10-24 DIAGNOSIS — Z7901 Long term (current) use of anticoagulants: Secondary | ICD-10-CM | POA: Diagnosis not present

## 2019-10-24 DIAGNOSIS — Z5181 Encounter for therapeutic drug level monitoring: Secondary | ICD-10-CM | POA: Diagnosis not present

## 2019-10-24 DIAGNOSIS — E039 Hypothyroidism, unspecified: Secondary | ICD-10-CM | POA: Diagnosis not present

## 2019-10-24 DIAGNOSIS — D500 Iron deficiency anemia secondary to blood loss (chronic): Secondary | ICD-10-CM | POA: Diagnosis not present

## 2019-10-25 DIAGNOSIS — J439 Emphysema, unspecified: Secondary | ICD-10-CM | POA: Diagnosis not present

## 2019-10-25 DIAGNOSIS — I712 Thoracic aortic aneurysm, without rupture: Secondary | ICD-10-CM | POA: Diagnosis not present

## 2019-10-25 DIAGNOSIS — C3401 Malignant neoplasm of right main bronchus: Secondary | ICD-10-CM | POA: Diagnosis not present

## 2019-10-25 DIAGNOSIS — I700 Atherosclerosis of aorta: Secondary | ICD-10-CM | POA: Diagnosis not present

## 2019-10-25 DIAGNOSIS — C3491 Malignant neoplasm of unspecified part of right bronchus or lung: Secondary | ICD-10-CM | POA: Diagnosis not present

## 2019-10-26 DIAGNOSIS — D500 Iron deficiency anemia secondary to blood loss (chronic): Secondary | ICD-10-CM | POA: Diagnosis not present

## 2019-10-26 DIAGNOSIS — Z952 Presence of prosthetic heart valve: Secondary | ICD-10-CM | POA: Diagnosis not present

## 2019-10-26 DIAGNOSIS — S22000A Wedge compression fracture of unspecified thoracic vertebra, initial encounter for closed fracture: Secondary | ICD-10-CM | POA: Diagnosis not present

## 2019-10-26 DIAGNOSIS — E039 Hypothyroidism, unspecified: Secondary | ICD-10-CM | POA: Diagnosis not present

## 2019-10-26 DIAGNOSIS — T66XXXA Radiation sickness, unspecified, initial encounter: Secondary | ICD-10-CM | POA: Diagnosis not present

## 2019-10-26 DIAGNOSIS — Z7901 Long term (current) use of anticoagulants: Secondary | ICD-10-CM | POA: Diagnosis not present

## 2019-10-26 DIAGNOSIS — Z681 Body mass index (BMI) 19 or less, adult: Secondary | ICD-10-CM | POA: Diagnosis not present

## 2019-10-26 DIAGNOSIS — C3401 Malignant neoplasm of right main bronchus: Secondary | ICD-10-CM | POA: Diagnosis not present

## 2019-10-26 DIAGNOSIS — K208 Other esophagitis without bleeding: Secondary | ICD-10-CM | POA: Diagnosis not present

## 2019-10-31 DIAGNOSIS — Z5181 Encounter for therapeutic drug level monitoring: Secondary | ICD-10-CM | POA: Diagnosis not present

## 2019-10-31 DIAGNOSIS — Z7901 Long term (current) use of anticoagulants: Secondary | ICD-10-CM | POA: Diagnosis not present

## 2019-11-01 DIAGNOSIS — M546 Pain in thoracic spine: Secondary | ICD-10-CM | POA: Diagnosis not present

## 2019-11-04 DIAGNOSIS — E538 Deficiency of other specified B group vitamins: Secondary | ICD-10-CM | POA: Diagnosis not present

## 2019-11-09 DIAGNOSIS — D500 Iron deficiency anemia secondary to blood loss (chronic): Secondary | ICD-10-CM | POA: Diagnosis not present

## 2019-11-09 DIAGNOSIS — Z7901 Long term (current) use of anticoagulants: Secondary | ICD-10-CM | POA: Diagnosis not present

## 2019-11-15 DIAGNOSIS — M546 Pain in thoracic spine: Secondary | ICD-10-CM | POA: Diagnosis not present

## 2019-11-16 DIAGNOSIS — Z7901 Long term (current) use of anticoagulants: Secondary | ICD-10-CM | POA: Diagnosis not present

## 2019-11-21 DIAGNOSIS — M546 Pain in thoracic spine: Secondary | ICD-10-CM | POA: Diagnosis not present

## 2019-11-28 DIAGNOSIS — Z5181 Encounter for therapeutic drug level monitoring: Secondary | ICD-10-CM | POA: Diagnosis not present

## 2019-11-28 DIAGNOSIS — M546 Pain in thoracic spine: Secondary | ICD-10-CM | POA: Diagnosis not present

## 2019-11-28 DIAGNOSIS — Z7901 Long term (current) use of anticoagulants: Secondary | ICD-10-CM | POA: Diagnosis not present

## 2019-12-07 DIAGNOSIS — Z7901 Long term (current) use of anticoagulants: Secondary | ICD-10-CM | POA: Diagnosis not present

## 2019-12-08 DIAGNOSIS — M546 Pain in thoracic spine: Secondary | ICD-10-CM | POA: Diagnosis not present

## 2019-12-09 DIAGNOSIS — E538 Deficiency of other specified B group vitamins: Secondary | ICD-10-CM | POA: Diagnosis not present

## 2019-12-13 DIAGNOSIS — M546 Pain in thoracic spine: Secondary | ICD-10-CM | POA: Diagnosis not present

## 2019-12-14 DIAGNOSIS — Z7901 Long term (current) use of anticoagulants: Secondary | ICD-10-CM | POA: Diagnosis not present

## 2019-12-14 DIAGNOSIS — D500 Iron deficiency anemia secondary to blood loss (chronic): Secondary | ICD-10-CM | POA: Diagnosis not present

## 2019-12-14 DIAGNOSIS — E039 Hypothyroidism, unspecified: Secondary | ICD-10-CM | POA: Diagnosis not present

## 2019-12-14 DIAGNOSIS — Z5181 Encounter for therapeutic drug level monitoring: Secondary | ICD-10-CM | POA: Diagnosis not present

## 2019-12-14 DIAGNOSIS — C3401 Malignant neoplasm of right main bronchus: Secondary | ICD-10-CM | POA: Diagnosis not present

## 2019-12-22 DIAGNOSIS — M546 Pain in thoracic spine: Secondary | ICD-10-CM | POA: Diagnosis not present

## 2019-12-27 DIAGNOSIS — G8929 Other chronic pain: Secondary | ICD-10-CM | POA: Diagnosis not present

## 2019-12-27 DIAGNOSIS — M7918 Myalgia, other site: Secondary | ICD-10-CM | POA: Diagnosis not present

## 2019-12-27 DIAGNOSIS — M546 Pain in thoracic spine: Secondary | ICD-10-CM | POA: Diagnosis not present

## 2020-01-23 DIAGNOSIS — C3401 Malignant neoplasm of right main bronchus: Secondary | ICD-10-CM | POA: Diagnosis not present

## 2020-01-23 DIAGNOSIS — D500 Iron deficiency anemia secondary to blood loss (chronic): Secondary | ICD-10-CM | POA: Diagnosis not present

## 2020-01-23 DIAGNOSIS — Z7901 Long term (current) use of anticoagulants: Secondary | ICD-10-CM | POA: Diagnosis not present

## 2020-01-23 DIAGNOSIS — E039 Hypothyroidism, unspecified: Secondary | ICD-10-CM | POA: Diagnosis not present

## 2020-01-26 DIAGNOSIS — M546 Pain in thoracic spine: Secondary | ICD-10-CM | POA: Diagnosis not present

## 2020-01-30 DIAGNOSIS — Z7901 Long term (current) use of anticoagulants: Secondary | ICD-10-CM | POA: Diagnosis not present

## 2020-01-31 DIAGNOSIS — J439 Emphysema, unspecified: Secondary | ICD-10-CM | POA: Diagnosis not present

## 2020-01-31 DIAGNOSIS — Z9221 Personal history of antineoplastic chemotherapy: Secondary | ICD-10-CM | POA: Diagnosis not present

## 2020-01-31 DIAGNOSIS — K222 Esophageal obstruction: Secondary | ICD-10-CM | POA: Diagnosis not present

## 2020-01-31 DIAGNOSIS — Z923 Personal history of irradiation: Secondary | ICD-10-CM | POA: Diagnosis not present

## 2020-01-31 DIAGNOSIS — I252 Old myocardial infarction: Secondary | ICD-10-CM | POA: Diagnosis not present

## 2020-01-31 DIAGNOSIS — K449 Diaphragmatic hernia without obstruction or gangrene: Secondary | ICD-10-CM | POA: Diagnosis not present

## 2020-01-31 DIAGNOSIS — M1990 Unspecified osteoarthritis, unspecified site: Secondary | ICD-10-CM | POA: Diagnosis not present

## 2020-01-31 DIAGNOSIS — Z85118 Personal history of other malignant neoplasm of bronchus and lung: Secondary | ICD-10-CM | POA: Diagnosis not present

## 2020-01-31 DIAGNOSIS — E039 Hypothyroidism, unspecified: Secondary | ICD-10-CM | POA: Diagnosis not present

## 2020-01-31 DIAGNOSIS — R1310 Dysphagia, unspecified: Secondary | ICD-10-CM | POA: Diagnosis not present

## 2020-01-31 DIAGNOSIS — I10 Essential (primary) hypertension: Secondary | ICD-10-CM | POA: Diagnosis not present

## 2020-01-31 DIAGNOSIS — M810 Age-related osteoporosis without current pathological fracture: Secondary | ICD-10-CM | POA: Diagnosis not present

## 2020-01-31 DIAGNOSIS — I2510 Atherosclerotic heart disease of native coronary artery without angina pectoris: Secondary | ICD-10-CM | POA: Diagnosis not present

## 2020-02-01 DIAGNOSIS — M79605 Pain in left leg: Secondary | ICD-10-CM | POA: Diagnosis not present

## 2020-02-01 DIAGNOSIS — R2242 Localized swelling, mass and lump, left lower limb: Secondary | ICD-10-CM | POA: Diagnosis not present

## 2020-02-01 DIAGNOSIS — S8012XA Contusion of left lower leg, initial encounter: Secondary | ICD-10-CM | POA: Diagnosis not present

## 2020-02-01 DIAGNOSIS — M7989 Other specified soft tissue disorders: Secondary | ICD-10-CM | POA: Diagnosis not present

## 2020-02-08 DIAGNOSIS — M546 Pain in thoracic spine: Secondary | ICD-10-CM | POA: Diagnosis not present

## 2020-02-13 DIAGNOSIS — E538 Deficiency of other specified B group vitamins: Secondary | ICD-10-CM | POA: Diagnosis not present

## 2020-02-14 DIAGNOSIS — M546 Pain in thoracic spine: Secondary | ICD-10-CM | POA: Diagnosis not present

## 2020-02-21 DIAGNOSIS — M546 Pain in thoracic spine: Secondary | ICD-10-CM | POA: Diagnosis not present

## 2020-02-24 DIAGNOSIS — C3401 Malignant neoplasm of right main bronchus: Secondary | ICD-10-CM | POA: Diagnosis not present

## 2020-02-24 DIAGNOSIS — Z5181 Encounter for therapeutic drug level monitoring: Secondary | ICD-10-CM | POA: Diagnosis not present

## 2020-02-24 DIAGNOSIS — Z7901 Long term (current) use of anticoagulants: Secondary | ICD-10-CM | POA: Diagnosis not present

## 2020-02-28 DIAGNOSIS — M546 Pain in thoracic spine: Secondary | ICD-10-CM | POA: Diagnosis not present

## 2020-03-06 DIAGNOSIS — M546 Pain in thoracic spine: Secondary | ICD-10-CM | POA: Diagnosis not present

## 2020-03-16 DIAGNOSIS — E538 Deficiency of other specified B group vitamins: Secondary | ICD-10-CM | POA: Diagnosis not present

## 2020-03-21 ENCOUNTER — Encounter: Payer: Self-pay | Admitting: Family Medicine

## 2020-03-21 DIAGNOSIS — J449 Chronic obstructive pulmonary disease, unspecified: Secondary | ICD-10-CM | POA: Diagnosis not present

## 2020-03-21 DIAGNOSIS — Z720 Tobacco use: Secondary | ICD-10-CM | POA: Diagnosis not present

## 2020-03-21 DIAGNOSIS — Z0001 Encounter for general adult medical examination with abnormal findings: Secondary | ICD-10-CM | POA: Diagnosis not present

## 2020-03-21 DIAGNOSIS — K219 Gastro-esophageal reflux disease without esophagitis: Secondary | ICD-10-CM | POA: Diagnosis not present

## 2020-03-21 DIAGNOSIS — E755 Other lipid storage disorders: Secondary | ICD-10-CM | POA: Diagnosis not present

## 2020-03-21 DIAGNOSIS — R5383 Other fatigue: Secondary | ICD-10-CM | POA: Diagnosis not present

## 2020-03-21 DIAGNOSIS — E538 Deficiency of other specified B group vitamins: Secondary | ICD-10-CM | POA: Diagnosis not present

## 2020-03-21 DIAGNOSIS — Z13220 Encounter for screening for lipoid disorders: Secondary | ICD-10-CM | POA: Diagnosis not present

## 2020-03-22 DIAGNOSIS — M546 Pain in thoracic spine: Secondary | ICD-10-CM | POA: Diagnosis not present

## 2020-03-24 DIAGNOSIS — Z23 Encounter for immunization: Secondary | ICD-10-CM | POA: Diagnosis not present

## 2020-03-28 DIAGNOSIS — I2510 Atherosclerotic heart disease of native coronary artery without angina pectoris: Secondary | ICD-10-CM | POA: Diagnosis not present

## 2020-03-28 DIAGNOSIS — Z7901 Long term (current) use of anticoagulants: Secondary | ICD-10-CM | POA: Diagnosis not present

## 2020-03-28 DIAGNOSIS — Z681 Body mass index (BMI) 19 or less, adult: Secondary | ICD-10-CM | POA: Diagnosis not present

## 2020-03-28 DIAGNOSIS — Z0000 Encounter for general adult medical examination without abnormal findings: Secondary | ICD-10-CM | POA: Diagnosis not present

## 2020-03-28 DIAGNOSIS — Z1331 Encounter for screening for depression: Secondary | ICD-10-CM | POA: Diagnosis not present

## 2020-03-28 DIAGNOSIS — J449 Chronic obstructive pulmonary disease, unspecified: Secondary | ICD-10-CM | POA: Diagnosis not present

## 2020-03-28 DIAGNOSIS — R5383 Other fatigue: Secondary | ICD-10-CM | POA: Diagnosis not present

## 2020-03-28 DIAGNOSIS — Z1389 Encounter for screening for other disorder: Secondary | ICD-10-CM | POA: Diagnosis not present

## 2020-03-29 DIAGNOSIS — M546 Pain in thoracic spine: Secondary | ICD-10-CM | POA: Diagnosis not present

## 2020-04-02 DIAGNOSIS — M546 Pain in thoracic spine: Secondary | ICD-10-CM | POA: Diagnosis not present

## 2020-04-12 DIAGNOSIS — M546 Pain in thoracic spine: Secondary | ICD-10-CM | POA: Diagnosis not present

## 2020-04-16 DIAGNOSIS — E538 Deficiency of other specified B group vitamins: Secondary | ICD-10-CM | POA: Diagnosis not present

## 2020-04-17 DIAGNOSIS — I2510 Atherosclerotic heart disease of native coronary artery without angina pectoris: Secondary | ICD-10-CM | POA: Diagnosis not present

## 2020-04-17 DIAGNOSIS — K219 Gastro-esophageal reflux disease without esophagitis: Secondary | ICD-10-CM | POA: Diagnosis not present

## 2020-04-17 DIAGNOSIS — Z85028 Personal history of other malignant neoplasm of stomach: Secondary | ICD-10-CM | POA: Diagnosis not present

## 2020-04-17 DIAGNOSIS — M1990 Unspecified osteoarthritis, unspecified site: Secondary | ICD-10-CM | POA: Diagnosis not present

## 2020-04-17 DIAGNOSIS — M810 Age-related osteoporosis without current pathological fracture: Secondary | ICD-10-CM | POA: Diagnosis not present

## 2020-04-17 DIAGNOSIS — J439 Emphysema, unspecified: Secondary | ICD-10-CM | POA: Diagnosis not present

## 2020-04-17 DIAGNOSIS — K449 Diaphragmatic hernia without obstruction or gangrene: Secondary | ICD-10-CM | POA: Diagnosis not present

## 2020-04-17 DIAGNOSIS — Z7901 Long term (current) use of anticoagulants: Secondary | ICD-10-CM | POA: Diagnosis not present

## 2020-04-17 DIAGNOSIS — Z9221 Personal history of antineoplastic chemotherapy: Secondary | ICD-10-CM | POA: Diagnosis not present

## 2020-04-17 DIAGNOSIS — E039 Hypothyroidism, unspecified: Secondary | ICD-10-CM | POA: Diagnosis not present

## 2020-04-17 DIAGNOSIS — Z952 Presence of prosthetic heart valve: Secondary | ICD-10-CM | POA: Diagnosis not present

## 2020-04-17 DIAGNOSIS — K222 Esophageal obstruction: Secondary | ICD-10-CM | POA: Diagnosis not present

## 2020-04-17 DIAGNOSIS — I252 Old myocardial infarction: Secondary | ICD-10-CM | POA: Diagnosis not present

## 2020-04-17 DIAGNOSIS — Z7982 Long term (current) use of aspirin: Secondary | ICD-10-CM | POA: Diagnosis not present

## 2020-04-17 DIAGNOSIS — Z87891 Personal history of nicotine dependence: Secondary | ICD-10-CM | POA: Diagnosis not present

## 2020-04-17 DIAGNOSIS — Z923 Personal history of irradiation: Secondary | ICD-10-CM | POA: Diagnosis not present

## 2020-04-17 DIAGNOSIS — I10 Essential (primary) hypertension: Secondary | ICD-10-CM | POA: Diagnosis not present

## 2020-04-17 DIAGNOSIS — Z79899 Other long term (current) drug therapy: Secondary | ICD-10-CM | POA: Diagnosis not present

## 2020-04-24 ENCOUNTER — Encounter: Payer: Self-pay | Admitting: Family Medicine

## 2020-04-24 ENCOUNTER — Ambulatory Visit (INDEPENDENT_AMBULATORY_CARE_PROVIDER_SITE_OTHER): Payer: Medicare Other | Admitting: Family Medicine

## 2020-04-24 ENCOUNTER — Encounter: Payer: Self-pay | Admitting: *Deleted

## 2020-04-24 VITALS — BP 100/62 | HR 72 | Ht 63.0 in | Wt 113.0 lb

## 2020-04-24 DIAGNOSIS — I10 Essential (primary) hypertension: Secondary | ICD-10-CM | POA: Diagnosis not present

## 2020-04-24 DIAGNOSIS — I712 Thoracic aortic aneurysm, without rupture, unspecified: Secondary | ICD-10-CM

## 2020-04-24 DIAGNOSIS — R0609 Other forms of dyspnea: Secondary | ICD-10-CM

## 2020-04-24 DIAGNOSIS — I7121 Aneurysm of the ascending aorta, without rupture: Secondary | ICD-10-CM

## 2020-04-24 DIAGNOSIS — R0600 Dyspnea, unspecified: Secondary | ICD-10-CM | POA: Diagnosis not present

## 2020-04-24 DIAGNOSIS — R079 Chest pain, unspecified: Secondary | ICD-10-CM

## 2020-04-24 DIAGNOSIS — Z952 Presence of prosthetic heart valve: Secondary | ICD-10-CM

## 2020-04-24 MED ORDER — METOPROLOL TARTRATE 25 MG PO TABS: 12.5000 mg | ORAL_TABLET | Freq: Two times a day (BID) | ORAL | 1 refills | Status: DC

## 2020-04-24 NOTE — Patient Instructions (Addendum)
Your physician recommends that you schedule a follow-up appointment in: [place], NP  Your physician has recommended you make the following change in your medication:   DECREASE METOPROLOL 12.5 MG TWICE DAILY    Your physician has requested that you have a lexiscan myoview. For further information please visit [email or web address]. Please follow instruction sheet, as given.  Your physician has requested that you have an echocardiogram. Echocardiography is a painless test that uses sound waves to create images of your heart. It provides your doctor with information about the size and shape of your heart and how well your heart's chambers and valves are working. This procedure takes approximately one hour. There are no restrictions for this procedure.  Your physician has requested that you have CHEST CT. Cardiac computed tomography (CT) is a painless test that uses an x-ray machine to take clear, detailed pictures of your heart. For further information please visit [email or web address]. Please follow instruction sheet as given.

## 2020-04-24 NOTE — Progress Notes (Signed)
Cardiology Office Note  Date: 04/24/2020   ID: Judy Sawyer, DOB [birth year], MRN [id number]  PCP:  Denny Levy, [place]  Cardiologist:  Kate Sable, MD (Inactive) Electrophysiologist:  None   Chief Complaint: Follow-up mechanical aortic valve replacement  History of Present Illness: Judy Sawyer is a 64 y.o. 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  History of ascending thoracic aortic aneurysm, COPD, esophageal strictures with multiple esophageal dilatations, chronic back pain.   Notes from radiation oncologist dated 04/27/2019 stated she apparently developed clinical evidence of CHF.  Had some lower extremity swelling evaluated by PCP but not currently on diuretic.  She denied any chest pain.  Chronic exertional dyspnea was stable.  PET scan October 2019 demonstrated for 0.4 cm ascending thoracic aortic aneurysm.  Thoracic imaging April 2020 [facility] 4.1 cm aneurysm.  Head CT October at [place] aortic valve functioning normally by echo 2018.  On Lovenox as apparently slow metabolizer of warfarin.  No changes to antihypertensive therapy.  She presents today for 1 year follow-up.  She states she knows she is past due for CT scan to reassess her thoracic ascending aortic aneurysm.  She is been complaining of some increased shortness of breath with exertion.  States it seems to be increasing with activity but she can have shortness of breath at rest on occasion.  Also complaining of some left-sided chest pain that can occur at rest and with activity.  States it mostly occurs with activity she denies any radiation to neck, arm, back, jaw.  States she does sometimes have some mild dizziness and balance issues when attempting to walk she is sometimes unable to walk in a straight line.  She continues to see oncology for her history of  lung cancer.  States she has had approximately 18 esophageal dilatations due to scar tissue from radiation therapy.  Denies any orthopnea or PND.  No lower extremity edema.  No weight gain.   Past Medical History:  Diagnosis Date  . Acute bronchitis   . Anemia, unspecified   . Aortic stenosis    previous cardiologisit: Dr. Remigio Eisenmenger in [place], [place]  . Carotid stenosis 05/13   mild less than 50% bilaterally  . Chest pain   . COPD (chronic obstructive pulmonary disease) ([place])   . Fibromyalgia   . GERD (gastroesophageal reflux disease)   . Murmur   . Tobacco abuse     Past Surgical History:  Procedure Laterality Date  . ABDOMINAL HYSTERECTOMY    . AORTIC VALVE REPLACEMENT  11/2011   in [place]. St. Jude 19 mm  . COLONOSCOPY    . KNEE ARTHROSCOPY WITH LATERAL MENISECTOMY Left 08/16/2014   Procedure: LEFT KNEE ARTHROSCOPY WITH LATERAL MENISECTOMY;  Surgeon: Lorn Junes, MD;  Location: [facility];  Service: Orthopedics;  Laterality: Left;  . KNEE ARTHROSCOPY WITH MEDIAL MENISECTOMY Left 08/16/2014   Procedure: KNEE ARTHROSCOPY WITH MEDIAL MENISECTOMY;  Surgeon: Lorn Junes, MD;  Location: [facility];  Service: Orthopedics;  Laterality: Left;  . TONSILLECTOMY    . UPPER GI ENDOSCOPY      Current Outpatient Medications  Medication Sig Dispense Refill  . aspirin EC 81 MG tablet Take 81 mg by mouth daily.    . calcium carbonate (CALTRATE 600) 1500 (600 Ca) MG TABS tablet Take 1 tablet by mouth daily with breakfast.    .  Cholecalciferol (VITAMIN D3) 1000 units CAPS Take 1 capsule by mouth daily.    Marland Kitchen enoxaparin (LOVENOX) 40 MG/0.4ML injection Inject 40 mg into the skin 2 (two) times daily.  1  . fluticasone (FLONASE) 50 MCG/ACT nasal spray Place into both nostrils as needed for allergies or rhinitis.    Marland Kitchen HYDROcodone-acetaminophen (NORCO) 7.5-325 MG tablet Take 1 tablet by mouth daily.    Marland Kitchen levothyroxine (SYNTHROID) 100 MCG tablet Take 100 mcg by  mouth daily.    . Melatonin 5 MG TABS Take 1 tablet by mouth daily.    . metoprolol tartrate (LOPRESSOR) 25 MG tablet Take 1 tablet by mouth twice daily 180 tablet 2  . pantoprazole (PROTONIX) 40 MG tablet Take 40 mg by mouth 2 (two) times daily.     . prochlorperazine (COMPAZINE) 10 MG tablet Take 10 mg by mouth every 6 (six) hours as needed for nausea or vomiting.    . Sennosides 8.6 MG CAPS Take 1 capsule by mouth daily as needed.      No current facility-administered medications for this visit.   Allergies:  Codeine   Social History: The patient  reports that she quit smoking about 2 years ago. Her smoking use included cigarettes. She started smoking about 50 years ago. She has a 21.00 pack-year smoking history. She has never used smokeless tobacco. She reports that she does not drink alcohol and does not use drugs.   Family History: The patient's family history includes Cancer in her mother; Diabetes type II in her sister; Emphysema in her father; Heart attack in her mother; Heart disease in her sister; Liver disease in her father; Rheumatic fever in her sister.   ROS:  Please see the history of present illness. Otherwise, complete review of systems is positive for none.  All other systems are reviewed and negative.   Physical Exam: VS:  BP 100/62   Pulse 72   Ht 5\' 3"  (1.6 m)   Wt 113 lb (51.3 kg)   SpO2 93%   BMI 20.02 kg/m , BMI Body mass index is 20.02 kg/m.  Wt Readings from Last 3 Encounters:  04/24/20 113 lb (51.3 kg)  06/23/19 106 lb (48.1 kg)  01/20/19 108 lb 12.8 oz (49.4 kg)    General: Thin appearing patient appears comfortable at rest. Neck: Supple, no elevated JVP or carotid bruits, no thyromegaly. Lungs: Mild expiratory wheeze heard bilateral posterior lung fields, nonlabored breathing at rest. Cardiac: Regular rate and rhythm, no S3 or significant systolic murmur, no pericardial rub. Extremities: No pitting edema, distal pulses 2+. Skin: Warm and  dry. Musculoskeletal: No kyphosis. Neuropsychiatric: Alert and oriented x3, affect grossly appropriate.  ECG:  An ECG dated 04/24/2020 was personally reviewed today and demonstrated:  EKG shows normal sinus rhythm rate of 77, right atrial enlargement, septal infarct, age undetermined.  Recent Labwork: No results found for requested labs within last 8760 hours.  No results found for: CHOL, TRIG, HDL, CHOLHDL, VLDL, LDLCALC, LDLDIRECT  Other Studies Reviewed Today:   Lower venous Doppler study of left leg 02/01/2020 [facility] Impressions Performed At  1. No evidence of DVT within the left lower extremity.  2. Note made of an approximately 2.6 cm hypoechoic lobulated fluid  collection with the subcutaneous tissues subjacent to the area of  bruising involving the aspect the left calf, nonspecific though  given overlying bruising may represent an evolving hematoma.  Clinical correlation is advised          Oncology History Overview  Note  1/19: PET/CT:  Impression  - Demonstration of a large hypermetabolic soft tissue mass in the right aspect of the mediastinum from the level of the aortic arch extending down to encompass the right mainstem bronchus as described above. There is also a separate hypermetabolic right paratracheal lymph node just superior to the mass as described above.  - Hypermetabolic left level 2A lymph node as described above in which metastatic disease cannot be excluded.  - Mildly hypermetabolic subpleural pulmonary opacity in the inferior lateral aspect of the right middle lobe as described above. This may be inflammation from scarring, recommend attention on follow-up imaging.      02/11/2019 at [facility] CT showed ascending aorta measures 4.5 cm  Recommend semiannual imaging follow-up CTA or MRI and referral to cardiothoracic surgery if not already obtained.  Echocardiogram 04/09/2017 Study Conclusions   - Left ventricle: The cavity  size was normal. Wall thickness was  increased in a pattern of mild LVH. Systolic function was normal.  The estimated ejection fraction was in the range of 60% to 65%.  Wall motion was normal; there were no regional wall motion  abnormalities. Doppler parameters are consistent with abnormal  left ventricular relaxation (grade 1 diastolic dysfunction).  - Aortic valve: A 19 mm St. Jude mechanical prosthesis is in the  aortic position. There was trivial regurgitation. Mean gradient  (S): 11 mm Hg. Peak gradient (S): 21 mm Hg. VTI ratio of LVOT to  aortic valve: 0.64.  - Mitral valve: Mildly calcified annulus. Mildly thickened leaflets  . There was trivial regurgitation.  - Right atrium: Central venous pressure (est): 3 mm Hg.  - Atrial septum: The interatrial septum was hypermobile. No defect  or patent foramen ovale was identified.  - Tricuspid valve: There was trivial regurgitation.  - Pulmonary arteries: PA peak pressure: 34 mm Hg (S).  - Pericardium, extracardiac: There was no pericardial effusion.   Impressions:   - Mild LVH with LVEF 60-65% and grade 1 diastolic dysfunction.  Mildly calcified mitral annulus and thickened leaflets with  trivial mitral regurgitation. St. Jude mechanical prosthesis in  aortic position with trivial aortic regurgitation and gradients  suggesting grossly normal function. Trivial tricuspid  regurgitation with estimated PASP 34 mmHg.   Assessment and Plan:  1. Thoracic ascending aortic aneurysm ([place])   2. H/O mechanical aortic valve replacement   3. Essential hypertension   4. DOE (dyspnea on exertion)   5. Chest pain, unspecified type    .1. Thoracic ascending aortic aneurysm [facility]) History thoracic ascending aortic aneurysm.Chest CT 02/11/2019 showed ascending aorta measures 4.5 cm.  Recommended semiannual imaging follow-up CTA or MRI and referral to cardiothoracic surgery if not already obtained.  Repeat chest CT to  reassess thoracic ascending aortic aneurysm.  2. H/O mechanical aortic valve replacement Last echocardiogram 03/20/2017 demonstrated mild LVH, EF 60 to 65%.  G1 DD, Saint Jude mechanical prosthesis in the aortic position trivial regurgitation.  Trivial mitral regurgitation, trivial tricuspid regurgitation.  Continue Lovenox injections 40 mg subcu twice daily.  Continue aspirin 81 mg daily  3. Essential hypertension Blood pressure on the low normal side at 100/62.  Heart rate 72.  Decrease metoprolol to 12.5 mg p.o. twice daily.    4. DOE (dyspnea on exertion) Complaining of increased dyspnea on exertion.  States shortness of breath seems to be getting worse over the last few months.  Please get a repeat echocardiogram  5. Chest pain, unspecified type Patient complaining of chest pain with  and without activity.  States it appears to get worse when she is more active.  Denies any radiation to neck, arm, back, jaw.  Denies any nausea or vomiting.  Denies any diaphoresis.  Please schedule for a Lexiscan stress test.  Medication Adjustments/Labs and Tests Ordered: Current medicines are reviewed at length with the patient today.  Concerns regarding medicines are outlined above.   Disposition: Follow-up with Dr. Domenic Polite or APP 8 weeks  Signed, Levell July, NP 04/24/2020 1:55 PM    [facility] at [place], [place], [place] [address] Phone: [phone]; Fax: [phone]

## 2020-04-25 ENCOUNTER — Telehealth: Payer: Self-pay | Admitting: Family Medicine

## 2020-04-25 DIAGNOSIS — D500 Iron deficiency anemia secondary to blood loss (chronic): Secondary | ICD-10-CM | POA: Diagnosis not present

## 2020-04-25 DIAGNOSIS — C3401 Malignant neoplasm of right main bronchus: Secondary | ICD-10-CM | POA: Diagnosis not present

## 2020-04-25 DIAGNOSIS — E039 Hypothyroidism, unspecified: Secondary | ICD-10-CM | POA: Diagnosis not present

## 2020-04-25 DIAGNOSIS — Z7901 Long term (current) use of anticoagulants: Secondary | ICD-10-CM | POA: Diagnosis not present

## 2020-04-25 NOTE — Telephone Encounter (Signed)
Pre-cert Verification for the following procedure     LEXISCAN MYOVIEW & ECHO  Scheduled for 05-04-2020 at [facility] scheduled for 05/18/2020 at [facility]

## 2020-04-25 NOTE — Addendum Note (Signed)
Addended by: Julian Hy T on: 04/25/2020 09:27 AM   Modules accepted: Orders

## 2020-05-01 DIAGNOSIS — M818 Other osteoporosis without current pathological fracture: Secondary | ICD-10-CM | POA: Diagnosis not present

## 2020-05-01 DIAGNOSIS — R1310 Dysphagia, unspecified: Secondary | ICD-10-CM | POA: Diagnosis not present

## 2020-05-01 DIAGNOSIS — C3401 Malignant neoplasm of right main bronchus: Secondary | ICD-10-CM | POA: Diagnosis not present

## 2020-05-01 DIAGNOSIS — D509 Iron deficiency anemia, unspecified: Secondary | ICD-10-CM | POA: Diagnosis not present

## 2020-05-01 DIAGNOSIS — Z7901 Long term (current) use of anticoagulants: Secondary | ICD-10-CM | POA: Diagnosis not present

## 2020-05-01 DIAGNOSIS — E032 Hypothyroidism due to medicaments and other exogenous substances: Secondary | ICD-10-CM | POA: Diagnosis not present

## 2020-05-01 DIAGNOSIS — Z5181 Encounter for therapeutic drug level monitoring: Secondary | ICD-10-CM | POA: Diagnosis not present

## 2020-05-03 DIAGNOSIS — M546 Pain in thoracic spine: Secondary | ICD-10-CM | POA: Diagnosis not present

## 2020-05-04 ENCOUNTER — Ambulatory Visit (HOSPITAL_COMMUNITY)
Admission: RE | Admit: 2020-05-04 | Discharge: 2020-05-04 | Disposition: A | Discharge status: Home / Self Care | Payer: Medicare Other | Source: Ambulatory Visit | Attending: Family Medicine | Admitting: Family Medicine

## 2020-05-04 ENCOUNTER — Other Ambulatory Visit: Payer: Self-pay

## 2020-05-04 ENCOUNTER — Encounter (HOSPITAL_COMMUNITY): Payer: Self-pay

## 2020-05-04 ENCOUNTER — Ambulatory Visit (HOSPITAL_BASED_OUTPATIENT_CLINIC_OR_DEPARTMENT_OTHER)
Admission: RE | Admit: 2020-05-04 | Discharge: 2020-05-04 | Disposition: A | Discharge status: Home / Self Care | Payer: Medicare Other | Source: Ambulatory Visit | Attending: Family Medicine | Admitting: Family Medicine

## 2020-05-04 ENCOUNTER — Encounter (HOSPITAL_COMMUNITY)
Admission: RE | Admit: 2020-05-04 | Discharge: 2020-05-04 | Disposition: A | Discharge status: Home / Self Care | Payer: Medicare Other | Source: Ambulatory Visit | Attending: Family Medicine | Admitting: Family Medicine

## 2020-05-04 DIAGNOSIS — J441 Chronic obstructive pulmonary disease with (acute) exacerbation: Secondary | ICD-10-CM | POA: Diagnosis not present

## 2020-05-04 DIAGNOSIS — R0600 Dyspnea, unspecified: Secondary | ICD-10-CM | POA: Diagnosis not present

## 2020-05-04 DIAGNOSIS — R079 Chest pain, unspecified: Secondary | ICD-10-CM

## 2020-05-04 DIAGNOSIS — R0609 Other forms of dyspnea: Secondary | ICD-10-CM

## 2020-05-04 DIAGNOSIS — Z681 Body mass index (BMI) 19 or less, adult: Secondary | ICD-10-CM | POA: Diagnosis not present

## 2020-05-04 DIAGNOSIS — Z20828 Contact with and (suspected) exposure to other viral communicable diseases: Secondary | ICD-10-CM | POA: Diagnosis not present

## 2020-05-04 HISTORY — DX: Malignant (primary) neoplasm, unspecified: C80.1

## 2020-05-04 HISTORY — DX: Systemic involvement of connective tissue, unspecified: M35.9

## 2020-05-04 HISTORY — DX: Essential (primary) hypertension: I10

## 2020-05-04 LAB — ECHOCARDIOGRAM COMPLETE
AV Mean grad: 6.1 mmHg
AV Peak grad: 11.5 mmHg
Ao pk vel: 1.69 m/s
Area-P 1/2: 3.74 cm2
S' Lateral: 2.56 cm

## 2020-05-04 MED ORDER — SODIUM CHLORIDE FLUSH 0.9 % IV SOLN
INTRAVENOUS | Status: AC
  Filled 2020-05-04: qty 10

## 2020-05-04 MED ORDER — TECHNETIUM TC 99M TETROFOSMIN IV KIT
30.0000 | PACK | Freq: Once | INTRAVENOUS | Status: AC | PRN
  Administered 2020-05-08: 32.2 via INTRAVENOUS

## 2020-05-04 MED ORDER — TECHNETIUM TC 99M TETROFOSMIN IV KIT
10.0000 | PACK | Freq: Once | INTRAVENOUS | Status: AC | PRN
  Administered 2020-05-04: 11 via INTRAVENOUS

## 2020-05-04 MED ORDER — REGADENOSON 0.4 MG/5ML IV SOLN
INTRAVENOUS | Status: AC
  Filled 2020-05-04: qty 5

## 2020-05-04 NOTE — Progress Notes (Signed)
*  PRELIMINARY RESULTS* Echocardiogram 2D Echocardiogram has been performed.  Judy Sawyer 05/04/2020, 10:57 AM

## 2020-05-07 ENCOUNTER — Telehealth: Payer: Self-pay | Admitting: *Deleted

## 2020-05-07 DIAGNOSIS — Z7901 Long term (current) use of anticoagulants: Secondary | ICD-10-CM | POA: Diagnosis not present

## 2020-05-07 NOTE — Telephone Encounter (Signed)
Laurine Blazer, LPN  83/25/4982 64:15 AM EDT Back to Top    Notified, copy to pcp.

## 2020-05-07 NOTE — Telephone Encounter (Signed)
-----   Message from Verta Ellen., NP sent at 05/04/2020  4:44 PM EDT ----- Please call the patient and let her know the echo showed good pumping function of the heart.  Heart is a little muscular.  Treatment is to manage blood pressure to keep the blood pressure at or below 130/80.  Her St. Jude's mechanical valve looks good.  Has small amount of leaking around the valve.  Otherwise everything looks normal.  Thank you

## 2020-05-08 ENCOUNTER — Encounter (HOSPITAL_COMMUNITY)
Admission: RE | Admit: 2020-05-08 | Discharge: 2020-05-08 | Disposition: A | Discharge status: Home / Self Care | Payer: Medicare Other | Source: Ambulatory Visit | Attending: Family Medicine | Admitting: Family Medicine

## 2020-05-08 ENCOUNTER — Other Ambulatory Visit: Payer: Self-pay

## 2020-05-08 DIAGNOSIS — R0600 Dyspnea, unspecified: Secondary | ICD-10-CM | POA: Diagnosis not present

## 2020-05-08 LAB — NM MYOCAR MULTI W/SPECT W/WALL MOTION / EF
LV dias vol: 35 mL (ref 46–106)
LV sys vol: 6 mL
Peak HR: 95 {beats}/min
RATE: 0.84
Rest HR: 82 {beats}/min
SDS: 3
SRS: 1
SSS: 4
TID: 0.94

## 2020-05-08 MED ORDER — REGADENOSON 0.4 MG/5ML IV SOLN
INTRAVENOUS | Status: AC
  Administered 2020-05-08: 0.4 mg via INTRAVENOUS
  Filled 2020-05-08: qty 5

## 2020-05-08 MED ORDER — SODIUM CHLORIDE FLUSH 0.9 % IV SOLN
INTRAVENOUS | Status: AC
  Administered 2020-05-08: 10 mL via INTRAVENOUS
  Filled 2020-05-08: qty 10

## 2020-05-08 MED ORDER — SODIUM CHLORIDE FLUSH 0.9 % IV SOLN
INTRAVENOUS | Status: AC
  Filled 2020-05-08: qty 10

## 2020-05-08 MED ORDER — REGADENOSON 0.4 MG/5ML IV SOLN
INTRAVENOUS | Status: AC
  Filled 2020-05-08: qty 5

## 2020-05-10 ENCOUNTER — Telehealth: Payer: Self-pay | Admitting: *Deleted

## 2020-05-10 NOTE — Telephone Encounter (Signed)
-----   Message from Verta Ellen., NP sent at 05/09/2020  6:52 PM EDT ----- Please call the patient and let her know the stress test was normal. No evidence of lack of blood flow through the arteries in the heart

## 2020-05-10 NOTE — Telephone Encounter (Signed)
Laurine Blazer, LPN  97/95/3692 2:30 PM EDT Back to Top    Notified, copy to pcp.

## 2020-05-14 DIAGNOSIS — M546 Pain in thoracic spine: Secondary | ICD-10-CM | POA: Diagnosis not present

## 2020-05-15 ENCOUNTER — Ambulatory Visit (INDEPENDENT_AMBULATORY_CARE_PROVIDER_SITE_OTHER): Payer: Medicare Other | Admitting: *Deleted

## 2020-05-15 DIAGNOSIS — Z952 Presence of prosthetic heart valve: Secondary | ICD-10-CM

## 2020-05-15 DIAGNOSIS — Z5181 Encounter for therapeutic drug level monitoring: Secondary | ICD-10-CM | POA: Diagnosis not present

## 2020-05-15 LAB — POCT INR: INR: 3.8 — AB (ref 2.0–3.0)

## 2020-05-15 NOTE — Patient Instructions (Signed)
Hold warfarin tonight then decrease dose to 1 tablet daily except 1/2 tablet on Sundays (Has been taking 1 tablet daily.  Last INR at [facility] was 3.6)  Just finished prednisone taper and z pack.

## 2020-05-18 ENCOUNTER — Other Ambulatory Visit: Payer: Self-pay

## 2020-05-18 ENCOUNTER — Ambulatory Visit (HOSPITAL_COMMUNITY)
Admission: RE | Admit: 2020-05-18 | Discharge: 2020-05-18 | Disposition: A | Discharge status: Home / Self Care | Payer: Medicare Other | Source: Ambulatory Visit | Attending: Family Medicine | Admitting: Family Medicine

## 2020-05-18 DIAGNOSIS — R0602 Shortness of breath: Secondary | ICD-10-CM | POA: Diagnosis not present

## 2020-05-18 DIAGNOSIS — E538 Deficiency of other specified B group vitamins: Secondary | ICD-10-CM | POA: Diagnosis not present

## 2020-05-18 DIAGNOSIS — I712 Thoracic aortic aneurysm, without rupture, unspecified: Secondary | ICD-10-CM

## 2020-05-18 DIAGNOSIS — Z23 Encounter for immunization: Secondary | ICD-10-CM | POA: Diagnosis not present

## 2020-05-18 DIAGNOSIS — J449 Chronic obstructive pulmonary disease, unspecified: Secondary | ICD-10-CM | POA: Diagnosis not present

## 2020-05-18 LAB — POCT I-STAT CREATININE: Creatinine, Ser: 1.2 mg/dL — ABNORMAL HIGH (ref 0.44–1.00)

## 2020-05-18 MED ORDER — IOHEXOL 300 MG/ML  SOLN: 100.0000 mL | Freq: Once | INTRAMUSCULAR | Status: DC | PRN

## 2020-05-18 MED ORDER — IOHEXOL 350 MG/ML SOLN
75.0000 mL | Freq: Once | INTRAVENOUS | Status: AC | PRN
  Administered 2020-05-18: 75 mL via INTRAVENOUS

## 2020-05-21 ENCOUNTER — Telehealth: Payer: Self-pay | Admitting: *Deleted

## 2020-05-21 NOTE — Telephone Encounter (Signed)
Laurine Blazer, LPN  32/10[birth year]1989 44:45 AM EST Back to Top    Notified, copy to [place].

## 2020-05-21 NOTE — Telephone Encounter (Signed)
-----   Message from Verta Ellen., NP sent at 05/20/2020  6:18 PM EST ----- CT of chest shows stable ascending aortic aneurysm 43 mm grossly unchanged from 04/2018.   There is a worsening mass-like thickening about the right hilum potentially the result of evolving radiation changes. But progressive disease is not excluded. The reading provider recommends a PET scan could be performed as indicated. This might explain some of the shortness of breath. Please send results to PCP and Pulmonary provider. Thanks

## 2020-05-28 ENCOUNTER — Ambulatory Visit (INDEPENDENT_AMBULATORY_CARE_PROVIDER_SITE_OTHER): Payer: Medicare Other | Admitting: *Deleted

## 2020-05-28 DIAGNOSIS — Z952 Presence of prosthetic heart valve: Secondary | ICD-10-CM | POA: Diagnosis not present

## 2020-05-28 DIAGNOSIS — Z5181 Encounter for therapeutic drug level monitoring: Secondary | ICD-10-CM | POA: Diagnosis not present

## 2020-05-28 LAB — POCT INR: INR: 3.3 — AB (ref 2.0–3.0)

## 2020-05-28 MED ORDER — WARFARIN SODIUM 2 MG PO TABS: ORAL_TABLET | ORAL | 6 refills | Status: DC

## 2020-05-28 NOTE — Patient Instructions (Signed)
Hold warfarin tonight then decrease dose to 1 tablet daily except 1/2 tablet on Sundays and Thursdays Recheck in 2 weeks/lr

## 2020-05-31 DIAGNOSIS — M546 Pain in thoracic spine: Secondary | ICD-10-CM | POA: Diagnosis not present

## 2020-06-04 DIAGNOSIS — Z952 Presence of prosthetic heart valve: Secondary | ICD-10-CM | POA: Diagnosis not present

## 2020-06-04 DIAGNOSIS — R9389 Abnormal findings on diagnostic imaging of other specified body structures: Secondary | ICD-10-CM | POA: Diagnosis not present

## 2020-06-04 DIAGNOSIS — J431 Panlobular emphysema: Secondary | ICD-10-CM | POA: Diagnosis not present

## 2020-06-04 DIAGNOSIS — C3401 Malignant neoplasm of right main bronchus: Secondary | ICD-10-CM | POA: Diagnosis not present

## 2020-06-04 DIAGNOSIS — K222 Esophageal obstruction: Secondary | ICD-10-CM | POA: Diagnosis not present

## 2020-06-04 DIAGNOSIS — J700 Acute pulmonary manifestations due to radiation: Secondary | ICD-10-CM | POA: Diagnosis not present

## 2020-06-04 DIAGNOSIS — Z7901 Long term (current) use of anticoagulants: Secondary | ICD-10-CM | POA: Diagnosis not present

## 2020-06-04 DIAGNOSIS — Z6820 Body mass index (BMI) 20.0-20.9, adult: Secondary | ICD-10-CM | POA: Diagnosis not present

## 2020-06-04 DIAGNOSIS — S22000A Wedge compression fracture of unspecified thoracic vertebra, initial encounter for closed fracture: Secondary | ICD-10-CM | POA: Diagnosis not present

## 2020-06-11 ENCOUNTER — Other Ambulatory Visit: Payer: Self-pay

## 2020-06-11 ENCOUNTER — Ambulatory Visit (INDEPENDENT_AMBULATORY_CARE_PROVIDER_SITE_OTHER): Payer: Medicare Other | Admitting: *Deleted

## 2020-06-11 DIAGNOSIS — Z5181 Encounter for therapeutic drug level monitoring: Secondary | ICD-10-CM | POA: Diagnosis not present

## 2020-06-11 DIAGNOSIS — Z952 Presence of prosthetic heart valve: Secondary | ICD-10-CM | POA: Diagnosis not present

## 2020-06-11 LAB — POCT INR: INR: 2.2 (ref 2.0–3.0)

## 2020-06-11 NOTE — Patient Instructions (Signed)
Continue warfarin 1 tablet daily except 1/2 tablet on Sundays and Thursdays Recheck in 3 weeks/lr

## 2020-06-14 DIAGNOSIS — M546 Pain in thoracic spine: Secondary | ICD-10-CM | POA: Diagnosis not present

## 2020-06-14 NOTE — Progress Notes (Signed)
Cardiology Office Note  Date: 06/15/2020   ID: Judy Sawyer, Judy Sawyer Dec 10, 1955, MRN [id number]  PCP:  Denny Levy, [place]  Cardiologist:  No primary care provider on file. Electrophysiologist:  None   Chief Complaint: Follow-up mechanical aortic valve replacement  History of Present Illness: Judy Sawyer is a 64 y.o. 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  History of ascending thoracic aortic aneurysm, COPD, esophageal strictures with multiple esophageal dilatations, chronic back pain.   Notes from radiation oncologist dated 04/27/2019 stated she apparently developed clinical evidence of CHF.  Had some lower extremity swelling evaluated by PCP but not currently on diuretic.  She denied any chest pain.  Chronic exertional dyspnea was stable.  PET scan October 2019 demonstrated 4.5 cm ascending thoracic aortic aneurysm.  Thoracic imaging April 2020 [facility] 4.1 cm aneurysm.   Mechanical Saint Jude aortic valve functioning normally by echo 2018.  On Lovenox as apparently slow metabolizer of warfarin.  No changes to antihypertensive therapy.  She presented last visit 1 year follow-up.  She stated she knew she was past due for CT scan to reassess her thoracic ascending aortic aneurysm.  She was complaining of some increased shortness of breath with exertion.  Stated it seemed to be increasing with activity but she could have shortness of breath at rest on occasion.  Also plain of some left-sided chest pain that could occur at rest and with activity.  Stated it mostly occurred  with activity.  She denies any radiation to neck, arm, back, jaw.  States she did sometimes have some mild dizziness and balance issues.  When attempting to walk she was sometimes unable to walk in a straight line.  She continued to see oncology for her history of lung cancer.  Stated she had had  approximately 18 esophageal dilatations due to scar tissue from radiation therapy.  Denies any orthopnea or PND.  No lower extremity edema.  No weight gain.  She is here for follow-up status post recent echocardiogram, stress test and CT scan.  CT scan to reassess aortic aneurysm showed a 4.3 cm stable uncomplicated fusiform aneurysmal dilation of the ascending thoracic aorta grossly unchanged from previous scan in 2019.  Stress test was negative for ischemia and deemed a low risk study.  Echocardiogram showed EF of 60 to 65% some mild LVH and mild aortic regurgitation.  She denies any symptoms other than continuing chronic dyspnea.  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  Otherwise she denies any anginal symptoms, palpitations or arrhythmias, orthostatic symptoms, PND, orthopnea, claudication, DVT or PE-like symptoms, or lower extremity edema.   Past Medical History:  Diagnosis Date  . Acute bronchitis   . Anemia, unspecified   . Aortic stenosis    previous cardiologisit: Dr. Remigio Eisenmenger in [place], [place]  . Cancer ([place])   . Carotid stenosis 05/13   mild less than 50% bilaterally  . Chest pain   . Collagen vascular disease ([place])   . COPD (chronic obstructive pulmonary disease) ([place])   . Fibromyalgia   . GERD (gastroesophageal reflux disease)   . Hypertension   . Murmur   . Tobacco abuse     Past Surgical History:  Procedure Laterality Date  . ABDOMINAL HYSTERECTOMY    . AORTIC VALVE REPLACEMENT  11/2011   in [place]. St. Jude 19  mm  . COLONOSCOPY    . KNEE ARTHROSCOPY WITH LATERAL MENISECTOMY Left 08/16/2014   Procedure: LEFT KNEE ARTHROSCOPY WITH LATERAL MENISECTOMY;  Surgeon: Lorn Junes, MD;  Location: [place];  Service: Orthopedics;  Laterality: Left;  . KNEE ARTHROSCOPY WITH MEDIAL MENISECTOMY Left 08/16/2014   Procedure: KNEE ARTHROSCOPY WITH MEDIAL MENISECTOMY;  Surgeon: Lorn Junes, MD;  Location: [place];  Service: Orthopedics;  Laterality: Left;  . TONSILLECTOMY    . UPPER GI ENDOSCOPY      Current Outpatient Medications  Medication Sig Dispense Refill  . aspirin EC 81 MG tablet Take 81 mg by mouth daily.    . calcium carbonate (CALTRATE 600) 1500 (600 Ca) MG TABS tablet Take 1 tablet by mouth daily with breakfast.    . Cholecalciferol (VITAMIN D3) 1000 units CAPS Take 1 capsule by mouth daily.    . fluticasone (FLONASE) 50 MCG/ACT nasal spray Place into both nostrils as needed for allergies or rhinitis.    Marland Kitchen HYDROcodone-acetaminophen (NORCO) 7.5-325 MG tablet Take 1 tablet by mouth daily.    Marland Kitchen levothyroxine (SYNTHROID) 100 MCG tablet Take 100 mcg by mouth daily.    . Melatonin 5 MG TABS Take 1 tablet by mouth daily.    . metoprolol tartrate (LOPRESSOR) 25 MG tablet Take 0.5 tablets (12.5 mg total) by mouth 2 (two) times daily. 90 tablet 1  . pantoprazole (PROTONIX) 40 MG tablet Take 40 mg by mouth 2 (two) times daily.     . prochlorperazine (COMPAZINE) 10 MG tablet Take 10 mg by mouth every 6 (six) hours as needed for nausea or vomiting.    . Sennosides 8.6 MG CAPS Take 1 capsule by mouth daily as needed.     . warfarin (COUMADIN) 2 MG tablet Take 1 tablet daily except 1/2 tablet on Sundays and Thursdays or as directed 30 tablet 6   No current facility-administered medications for this visit.   Allergies:  Codeine   Social History: The patient  reports that she quit smoking about 2 years ago. Her smoking use included cigarettes. She started smoking about 50 years ago. She has a 21.00 pack-year smoking history. She has never used smokeless tobacco. She reports that she does not drink alcohol and does not use drugs.   Family History: The patient's family history includes Cancer in her mother; Diabetes type II in her sister; Emphysema in her father; Heart attack in her mother; Heart disease in her sister; Liver disease in her father; Rheumatic  fever in her sister.   ROS:  Please see the history of present illness. Otherwise, complete review of systems is positive for none.  All other systems are reviewed and negative.   Physical Exam: VS:  BP 112/78   Pulse 78   Ht 5\' 3"  (1.6 m)   Wt 114 lb (51.7 kg)   SpO2 98%   BMI 20.19 kg/m , BMI Body mass index is 20.19 kg/m.  Wt Readings from Last 3 Encounters:  06/15/20 114 lb (51.7 kg)  04/24/20 113 lb (51.3 kg)  06/23/19 106 lb (48.1 kg)    General: Thin appearing patient appears comfortable at rest. Neck: Supple, no elevated JVP or carotid bruits, no thyromegaly. Lungs: Mild expiratory wheeze heard bilateral posterior lung fields, nonlabored breathing at rest. Cardiac: Regular rate and rhythm, no S3 or significant systolic murmur, no pericardial rub. Extremities: No pitting edema, distal pulses 2+. Skin: Warm and dry. Musculoskeletal: No kyphosis. Neuropsychiatric: Alert and oriented x3,  affect grossly appropriate.  ECG:  An ECG dated 04/24/2020 was personally reviewed today and demonstrated:  EKG shows normal sinus rhythm rate of 77, right atrial enlargement, septal infarct, age undetermined.  Recent Labwork: 05/18/2020: Creatinine, Ser 1.20  No results found for: CHOL, TRIG, HDL, CHOLHDL, VLDL, LDLCALC, LDLDIRECT  Other Studies Reviewed Today:  CTA chest 05/19/2020 IMPRESSION: 1. Worsening masslike thickening about the right hilum, potentially the sequela of evolving radiation change though progressive disease is not excluded on the basis of this examination. Further evaluation with PET-CT could be performed as indicated. 2. Stable uncomplicated fusiform aneurysmal dilatation of the ascending thoracic aorta measuring 43 mm in maximal diameter, grossly unchanged compared to the 04/2018 examination. Recommend annual imaging followup by CTA or MRA. This recommendation follows 2010 ACCF/AHA/AATS/ACR/ASA/SCA/SCAI/SIR/STS/SVM Guidelines for the Diagnosis and Management  of Patients with Thoracic Aortic Disease. Circulation. 2010; 121: D149-F026. Aortic aneurysm NOS (ICD10-I71.9) 3. Stable sequela of previous open aortic valve repair. 4. Coronary calcifications.  Aortic Atherosclerosis (ICD10-I70.0). 5.  Emphysema (ICD10-J43.9).   NST 05/04/2020 Study Result  Narrative & Impression   There was no ST segment deviation noted during stress.  The study is normal. There are no perfusion defects  This is a low risk study.  The left ventricular ejection fraction is hyperdynamic (>65%).     Echocardiogram 05/04/2020  Left ventricular ejection fraction, by estimation, is 60 to 65%. The left ventricle has normal function. The left ventricle has no regional wall motion abnormalities. There is mild left ventricular hypertrophy. Left ventricular diastolic parameters are indeterminate. 2. Right ventricular systolic function is normal. The right ventricular size is normal. 3. The mitral valve is normal in structure. No evidence of mitral valve regurgitation. No evidence of mitral stenosis. 4. According to medical record St Jude mechanical valve 7mm is in the AV position.. The aortic valve has been repaired/replaced. Aortic valve regurgitation is mild. No aortic stenosis is present. 5. The inferior vena cava is normal in size with greater than 50% respiratory variability, suggesting right atrial pressure of 3 mmHg.    Lower venous Doppler study of left leg 02/01/2020 [facility] Impressions Performed At  1. No evidence of DVT within the left lower extremity.  2. Note made of an approximately 2.6 cm hypoechoic lobulated fluid  collection with the subcutaneous tissues subjacent to the area of  bruising involving the aspect the left calf, nonspecific though  given overlying bruising may represent an evolving hematoma.  Clinical correlation is advised          Oncology History Overview Note  1/19: PET/CT:  Impression  - Demonstration of a  large hypermetabolic soft tissue mass in the right aspect of the mediastinum from the level of the aortic arch extending down to encompass the right mainstem bronchus as described above. There is also a separate hypermetabolic right paratracheal lymph node just superior to the mass as described above.  - Hypermetabolic left level 2A lymph node as described above in which metastatic disease cannot be excluded.  - Mildly hypermetabolic subpleural pulmonary opacity in the inferior lateral aspect of the right middle lobe as described above. This may be inflammation from scarring, recommend attention on follow-up imaging.      02/11/2019 at [facility] Chest CT showed ascending aorta measures 4.5 cm  Recommend semiannual imaging follow-up CTA or MRI and referral to cardiothoracic surgery if not already obtained.  Echocardiogram 04/09/2017 Study Conclusions   - Left ventricle: The cavity size was normal. Wall thickness was  increased  in a pattern of mild LVH. Systolic function was normal.  The estimated ejection fraction was in the range of 60% to 65%.  Wall motion was normal; there were no regional wall motion  abnormalities. Doppler parameters are consistent with abnormal  left ventricular relaxation (grade 1 diastolic dysfunction).  - Aortic valve: A 19 mm St. Jude mechanical prosthesis is in the  aortic position. There was trivial regurgitation. Mean gradient  (S): 11 mm Hg. Peak gradient (S): 21 mm Hg. VTI ratio of LVOT to  aortic valve: 0.64.  - Mitral valve: Mildly calcified annulus. Mildly thickened leaflets  . There was trivial regurgitation.  - Right atrium: Central venous pressure (est): 3 mm Hg.  - Atrial septum: The interatrial septum was hypermobile. No defect  or patent foramen ovale was identified.  - Tricuspid valve: There was trivial regurgitation.  - Pulmonary arteries: PA peak pressure: 34 mm Hg (S).  - Pericardium, extracardiac: There was no  pericardial effusion.   Impressions:   - Mild LVH with LVEF 60-65% and grade 1 diastolic dysfunction.  Mildly calcified mitral annulus and thickened leaflets with  trivial mitral regurgitation. St. Jude mechanical prosthesis in  aortic position with trivial aortic regurgitation and gradients  suggesting grossly normal function. Trivial tricuspid  regurgitation with estimated PASP 34 mmHg.   Assessment and Plan:   .1. Thoracic ascending aortic aneurysm [facility]) History thoracic ascending aortic aneurysm.Chest CT 02/11/2019 showed ascending aorta measures 4.5 cm.  Recommended semiannual imaging follow-up CTA or MRI and referral to cardiothoracic surgery if not already obtained.  Repeat chest CT scan to reassess aortic aneurysm showed a 4.3 cm stable uncomplicated fusiform aneurysmal dilation of the ascending thoracic aorta grossly unchanged from previous scan in 2019.   2. H/O mechanical aortic valve replacement Last echocardiogram 03/20/2017 demonstrated mild LVH, EF 60 to 65%.  G1 DD, Saint Jude mechanical prosthesis in the aortic position trivial regurgitation.  Trivial mitral regurgitation, trivial tricuspid regurgitation.  Continue Lovenox injections 40 mg subcu twice daily.  Continue aspirin 81 mg daily  3. Essential hypertension Blood pressure on the low normal side at 100/62.  Heart rate 72.  Decrease metoprolol to 12.5 mg p.o. twice daily.    4. DOE (dyspnea on exertion) Complaining of increased dyspnea on exertion.  States shortness of breath seems to be getting worse over the last few months.  Repeat echocardiogram showed EF of 60 to 65% some mild LVH and mild aortic regurgitation.   5. Chest pain, unspecified type Patient complaining of chest pain with and without activity.  States it appears to get worse when she is more active.  Denies any radiation to neck, arm, back, jaw.  Denies any nausea or vomiting.  Denies any diaphoresis.  Recent Lexiscan stress test showed no  evidence of ischemia and deemed a low risk study.  Patient currently denies any chest pain or other anginal symptoms  Medication Adjustments/Labs and Tests Ordered: Current medicines are reviewed at length with the patient today.  Concerns regarding medicines are outlined above.   Disposition: Follow-up with Dr. Domenic Polite or APP 6 months  Signed, Levell July, NP 06/15/2020 2:23 PM    [facility] [place], [place], [place] [address] Phone: [phone]; Fax: [phone]

## 2020-06-15 ENCOUNTER — Encounter: Payer: Self-pay | Admitting: Family Medicine

## 2020-06-15 ENCOUNTER — Ambulatory Visit (INDEPENDENT_AMBULATORY_CARE_PROVIDER_SITE_OTHER): Payer: Medicare Other | Admitting: Family Medicine

## 2020-06-15 VITALS — BP 112/78 | HR 78 | Ht 63.0 in | Wt 114.0 lb

## 2020-06-15 DIAGNOSIS — I712 Thoracic aortic aneurysm, without rupture: Secondary | ICD-10-CM

## 2020-06-15 DIAGNOSIS — R0600 Dyspnea, unspecified: Secondary | ICD-10-CM | POA: Diagnosis not present

## 2020-06-15 DIAGNOSIS — Z952 Presence of prosthetic heart valve: Secondary | ICD-10-CM

## 2020-06-15 DIAGNOSIS — R079 Chest pain, unspecified: Secondary | ICD-10-CM

## 2020-06-15 DIAGNOSIS — I10 Essential (primary) hypertension: Secondary | ICD-10-CM | POA: Diagnosis not present

## 2020-06-15 DIAGNOSIS — R0609 Other forms of dyspnea: Secondary | ICD-10-CM

## 2020-06-15 DIAGNOSIS — I7121 Aneurysm of the ascending aorta, without rupture: Secondary | ICD-10-CM

## 2020-06-15 NOTE — Patient Instructions (Signed)
Your physician recommends that you schedule a follow-up appointment in: [place] MD  Your physician recommends that you continue on your current medications as directed. Please refer to the Current Medication list given to you today.  Thank you for choosing [facility]!!

## 2020-06-18 ENCOUNTER — Other Ambulatory Visit (HOSPITAL_COMMUNITY): Payer: Self-pay | Admitting: Oncology

## 2020-06-18 DIAGNOSIS — C3401 Malignant neoplasm of right main bronchus: Secondary | ICD-10-CM

## 2020-06-25 ENCOUNTER — Ambulatory Visit: Payer: No Typology Code available for payment source | Admitting: Family Medicine

## 2020-06-25 DIAGNOSIS — E039 Hypothyroidism, unspecified: Secondary | ICD-10-CM | POA: Diagnosis not present

## 2020-06-25 DIAGNOSIS — E538 Deficiency of other specified B group vitamins: Secondary | ICD-10-CM | POA: Diagnosis not present

## 2020-06-25 DIAGNOSIS — Z681 Body mass index (BMI) 19 or less, adult: Secondary | ICD-10-CM | POA: Diagnosis not present

## 2020-06-25 DIAGNOSIS — I2510 Atherosclerotic heart disease of native coronary artery without angina pectoris: Secondary | ICD-10-CM | POA: Diagnosis not present

## 2020-06-25 DIAGNOSIS — D649 Anemia, unspecified: Secondary | ICD-10-CM | POA: Diagnosis not present

## 2020-06-28 DIAGNOSIS — M546 Pain in thoracic spine: Secondary | ICD-10-CM | POA: Diagnosis not present

## 2020-07-02 ENCOUNTER — Ambulatory Visit (INDEPENDENT_AMBULATORY_CARE_PROVIDER_SITE_OTHER): Payer: Medicare Other | Admitting: *Deleted

## 2020-07-02 ENCOUNTER — Other Ambulatory Visit: Payer: Self-pay

## 2020-07-02 ENCOUNTER — Ambulatory Visit (HOSPITAL_COMMUNITY)
Admission: RE | Admit: 2020-07-02 | Discharge: 2020-07-02 | Disposition: A | Discharge status: Home / Self Care | Payer: Medicare Other | Source: Ambulatory Visit | Attending: Oncology | Admitting: Oncology

## 2020-07-02 DIAGNOSIS — C3401 Malignant neoplasm of right main bronchus: Secondary | ICD-10-CM | POA: Diagnosis not present

## 2020-07-02 DIAGNOSIS — C3490 Malignant neoplasm of unspecified part of unspecified bronchus or lung: Secondary | ICD-10-CM | POA: Diagnosis not present

## 2020-07-02 DIAGNOSIS — J439 Emphysema, unspecified: Secondary | ICD-10-CM | POA: Diagnosis not present

## 2020-07-02 DIAGNOSIS — Z952 Presence of prosthetic heart valve: Secondary | ICD-10-CM | POA: Diagnosis not present

## 2020-07-02 DIAGNOSIS — I712 Thoracic aortic aneurysm, without rupture: Secondary | ICD-10-CM | POA: Diagnosis not present

## 2020-07-02 DIAGNOSIS — S22050A Wedge compression fracture of T5-T6 vertebra, initial encounter for closed fracture: Secondary | ICD-10-CM | POA: Diagnosis not present

## 2020-07-02 DIAGNOSIS — Z5181 Encounter for therapeutic drug level monitoring: Secondary | ICD-10-CM | POA: Diagnosis not present

## 2020-07-02 LAB — POCT INR: INR: 1.7 — AB (ref 2.0–3.0)

## 2020-07-02 MED ORDER — FLUDEOXYGLUCOSE F - 18 (FDG) INJECTION
6.8200 | Freq: Once | INTRAVENOUS | Status: AC | PRN
  Administered 2020-07-02: 6.82 via INTRAVENOUS

## 2020-07-02 NOTE — Patient Instructions (Signed)
Take warfarin 2 tablets tonight then resume 1 tablet daily except 1/2 tablet on Sundays and Thursdays Pending endoscopy on 07/24/20  Hold warfarin 5 days before procedure and bridge with Lovenox  (lovenox being managed by Oncology) Recheck in 3 weeks

## 2020-07-04 DIAGNOSIS — Z7901 Long term (current) use of anticoagulants: Secondary | ICD-10-CM | POA: Diagnosis not present

## 2020-07-04 DIAGNOSIS — Z952 Presence of prosthetic heart valve: Secondary | ICD-10-CM | POA: Diagnosis not present

## 2020-07-04 DIAGNOSIS — C3401 Malignant neoplasm of right main bronchus: Secondary | ICD-10-CM | POA: Diagnosis not present

## 2020-07-04 DIAGNOSIS — C187 Malignant neoplasm of sigmoid colon: Secondary | ICD-10-CM | POA: Diagnosis not present

## 2020-07-04 DIAGNOSIS — Z7189 Other specified counseling: Secondary | ICD-10-CM | POA: Diagnosis not present

## 2020-07-04 DIAGNOSIS — R948 Abnormal results of function studies of other organs and systems: Secondary | ICD-10-CM | POA: Diagnosis not present

## 2020-07-17 ENCOUNTER — Telehealth: Payer: Self-pay | Admitting: *Deleted

## 2020-07-17 NOTE — Telephone Encounter (Signed)
Spoke with pt.  She needed to change 07/30/20 appt from pm to am.  Rescheduled to 9:30am.

## 2020-07-17 NOTE — Telephone Encounter (Signed)
Pt requested to speak with you.

## 2020-07-24 DIAGNOSIS — R933 Abnormal findings on diagnostic imaging of other parts of digestive tract: Secondary | ICD-10-CM | POA: Diagnosis not present

## 2020-07-24 DIAGNOSIS — I10 Essential (primary) hypertension: Secondary | ICD-10-CM | POA: Diagnosis not present

## 2020-07-24 DIAGNOSIS — Z7982 Long term (current) use of aspirin: Secondary | ICD-10-CM | POA: Diagnosis not present

## 2020-07-24 DIAGNOSIS — E039 Hypothyroidism, unspecified: Secondary | ICD-10-CM | POA: Diagnosis not present

## 2020-07-24 DIAGNOSIS — I2510 Atherosclerotic heart disease of native coronary artery without angina pectoris: Secondary | ICD-10-CM | POA: Diagnosis not present

## 2020-07-24 DIAGNOSIS — C785 Secondary malignant neoplasm of large intestine and rectum: Secondary | ICD-10-CM | POA: Diagnosis not present

## 2020-07-24 DIAGNOSIS — K449 Diaphragmatic hernia without obstruction or gangrene: Secondary | ICD-10-CM | POA: Diagnosis not present

## 2020-07-24 DIAGNOSIS — Z85118 Personal history of other malignant neoplasm of bronchus and lung: Secondary | ICD-10-CM | POA: Diagnosis not present

## 2020-07-24 DIAGNOSIS — J439 Emphysema, unspecified: Secondary | ICD-10-CM | POA: Diagnosis not present

## 2020-07-24 DIAGNOSIS — M1990 Unspecified osteoarthritis, unspecified site: Secondary | ICD-10-CM | POA: Diagnosis not present

## 2020-07-24 DIAGNOSIS — Z79899 Other long term (current) drug therapy: Secondary | ICD-10-CM | POA: Diagnosis not present

## 2020-07-24 DIAGNOSIS — Z7901 Long term (current) use of anticoagulants: Secondary | ICD-10-CM | POA: Diagnosis not present

## 2020-07-24 DIAGNOSIS — K219 Gastro-esophageal reflux disease without esophagitis: Secondary | ICD-10-CM | POA: Diagnosis not present

## 2020-07-24 DIAGNOSIS — I471 Supraventricular tachycardia: Secondary | ICD-10-CM | POA: Diagnosis not present

## 2020-07-24 DIAGNOSIS — K222 Esophageal obstruction: Secondary | ICD-10-CM | POA: Diagnosis not present

## 2020-07-24 DIAGNOSIS — Z79890 Hormone replacement therapy (postmenopausal): Secondary | ICD-10-CM | POA: Diagnosis not present

## 2020-07-26 DIAGNOSIS — K6389 Other specified diseases of intestine: Secondary | ICD-10-CM | POA: Diagnosis not present

## 2020-07-31 DIAGNOSIS — C187 Malignant neoplasm of sigmoid colon: Secondary | ICD-10-CM | POA: Diagnosis not present

## 2020-08-01 DIAGNOSIS — J439 Emphysema, unspecified: Secondary | ICD-10-CM | POA: Diagnosis not present

## 2020-08-01 DIAGNOSIS — Z85118 Personal history of other malignant neoplasm of bronchus and lung: Secondary | ICD-10-CM | POA: Diagnosis not present

## 2020-08-01 DIAGNOSIS — C189 Malignant neoplasm of colon, unspecified: Secondary | ICD-10-CM | POA: Diagnosis not present

## 2020-08-01 DIAGNOSIS — Y842 Radiological procedure and radiotherapy as the cause of abnormal reaction of the patient, or of later complication, without mention of misadventure at the time of the procedure: Secondary | ICD-10-CM | POA: Diagnosis not present

## 2020-08-01 DIAGNOSIS — Z01818 Encounter for other preprocedural examination: Secondary | ICD-10-CM | POA: Diagnosis not present

## 2020-08-01 DIAGNOSIS — E039 Hypothyroidism, unspecified: Secondary | ICD-10-CM | POA: Diagnosis not present

## 2020-08-01 DIAGNOSIS — R000 Tachycardia, unspecified: Secondary | ICD-10-CM | POA: Diagnosis not present

## 2020-08-01 DIAGNOSIS — K6389 Other specified diseases of intestine: Secondary | ICD-10-CM | POA: Diagnosis not present

## 2020-08-01 DIAGNOSIS — K2100 Gastro-esophageal reflux disease with esophagitis, without bleeding: Secondary | ICD-10-CM | POA: Diagnosis not present

## 2020-08-01 DIAGNOSIS — Z952 Presence of prosthetic heart valve: Secondary | ICD-10-CM | POA: Diagnosis not present

## 2020-08-03 DIAGNOSIS — K91840 Postprocedural hemorrhage and hematoma of a digestive system organ or structure following a digestive system procedure: Secondary | ICD-10-CM | POA: Diagnosis not present

## 2020-08-03 DIAGNOSIS — I4949 Other premature depolarization: Secondary | ICD-10-CM | POA: Diagnosis not present

## 2020-08-03 DIAGNOSIS — R578 Other shock: Secondary | ICD-10-CM | POA: Diagnosis not present

## 2020-08-03 DIAGNOSIS — J439 Emphysema, unspecified: Secondary | ICD-10-CM | POA: Diagnosis present

## 2020-08-03 DIAGNOSIS — K5900 Constipation, unspecified: Secondary | ICD-10-CM | POA: Diagnosis not present

## 2020-08-03 DIAGNOSIS — J90 Pleural effusion, not elsewhere classified: Secondary | ICD-10-CM | POA: Diagnosis not present

## 2020-08-03 DIAGNOSIS — K769 Liver disease, unspecified: Secondary | ICD-10-CM | POA: Diagnosis not present

## 2020-08-03 DIAGNOSIS — E876 Hypokalemia: Secondary | ICD-10-CM | POA: Diagnosis not present

## 2020-08-03 DIAGNOSIS — K222 Esophageal obstruction: Secondary | ICD-10-CM | POA: Diagnosis present

## 2020-08-03 DIAGNOSIS — R0689 Other abnormalities of breathing: Secondary | ICD-10-CM | POA: Diagnosis not present

## 2020-08-03 DIAGNOSIS — I10 Essential (primary) hypertension: Secondary | ICD-10-CM | POA: Diagnosis present

## 2020-08-03 DIAGNOSIS — D72829 Elevated white blood cell count, unspecified: Secondary | ICD-10-CM | POA: Diagnosis not present

## 2020-08-03 DIAGNOSIS — K5669 Other partial intestinal obstruction: Secondary | ICD-10-CM | POA: Diagnosis not present

## 2020-08-03 DIAGNOSIS — N179 Acute kidney failure, unspecified: Secondary | ICD-10-CM | POA: Diagnosis not present

## 2020-08-03 DIAGNOSIS — R0600 Dyspnea, unspecified: Secondary | ICD-10-CM | POA: Diagnosis not present

## 2020-08-03 DIAGNOSIS — E039 Hypothyroidism, unspecified: Secondary | ICD-10-CM | POA: Diagnosis not present

## 2020-08-03 DIAGNOSIS — J449 Chronic obstructive pulmonary disease, unspecified: Secondary | ICD-10-CM | POA: Diagnosis not present

## 2020-08-03 DIAGNOSIS — K660 Peritoneal adhesions (postprocedural) (postinfection): Secondary | ICD-10-CM | POA: Diagnosis not present

## 2020-08-03 DIAGNOSIS — U071 COVID-19: Secondary | ICD-10-CM | POA: Diagnosis not present

## 2020-08-03 DIAGNOSIS — C187 Malignant neoplasm of sigmoid colon: Secondary | ICD-10-CM | POA: Diagnosis not present

## 2020-08-03 DIAGNOSIS — C3490 Malignant neoplasm of unspecified part of unspecified bronchus or lung: Secondary | ICD-10-CM | POA: Diagnosis not present

## 2020-08-03 DIAGNOSIS — Z952 Presence of prosthetic heart valve: Secondary | ICD-10-CM | POA: Diagnosis not present

## 2020-08-03 DIAGNOSIS — J849 Interstitial pulmonary disease, unspecified: Secondary | ICD-10-CM | POA: Diagnosis not present

## 2020-08-03 DIAGNOSIS — I352 Nonrheumatic aortic (valve) stenosis with insufficiency: Secondary | ICD-10-CM | POA: Diagnosis present

## 2020-08-03 DIAGNOSIS — R000 Tachycardia, unspecified: Secondary | ICD-10-CM | POA: Diagnosis not present

## 2020-08-03 DIAGNOSIS — T8149XA Infection following a procedure, other surgical site, initial encounter: Secondary | ICD-10-CM | POA: Diagnosis not present

## 2020-08-03 DIAGNOSIS — J9811 Atelectasis: Secondary | ICD-10-CM | POA: Diagnosis not present

## 2020-08-03 DIAGNOSIS — R188 Other ascites: Secondary | ICD-10-CM | POA: Diagnosis not present

## 2020-08-03 DIAGNOSIS — C799 Secondary malignant neoplasm of unspecified site: Secondary | ICD-10-CM | POA: Diagnosis not present

## 2020-08-03 DIAGNOSIS — K661 Hemoperitoneum: Secondary | ICD-10-CM | POA: Diagnosis not present

## 2020-08-03 DIAGNOSIS — K560 Paralytic ileus: Secondary | ICD-10-CM | POA: Diagnosis not present

## 2020-08-03 DIAGNOSIS — M79A3 Nontraumatic compartment syndrome of abdomen: Secondary | ICD-10-CM | POA: Diagnosis not present

## 2020-08-03 DIAGNOSIS — D62 Acute posthemorrhagic anemia: Secondary | ICD-10-CM | POA: Diagnosis present

## 2020-08-03 DIAGNOSIS — C785 Secondary malignant neoplasm of large intestine and rectum: Secondary | ICD-10-CM | POA: Diagnosis not present

## 2020-08-03 DIAGNOSIS — E43 Unspecified severe protein-calorie malnutrition: Secondary | ICD-10-CM | POA: Diagnosis not present

## 2020-08-03 DIAGNOSIS — Z681 Body mass index (BMI) 19 or less, adult: Secondary | ICD-10-CM | POA: Diagnosis not present

## 2020-08-03 DIAGNOSIS — D689 Coagulation defect, unspecified: Secondary | ICD-10-CM | POA: Diagnosis not present

## 2020-08-03 DIAGNOSIS — D7389 Other diseases of spleen: Secondary | ICD-10-CM | POA: Diagnosis not present

## 2020-08-03 DIAGNOSIS — I712 Thoracic aortic aneurysm, without rupture: Secondary | ICD-10-CM | POA: Diagnosis present

## 2020-08-03 DIAGNOSIS — E861 Hypovolemia: Secondary | ICD-10-CM | POA: Diagnosis not present

## 2020-08-03 DIAGNOSIS — R112 Nausea with vomiting, unspecified: Secondary | ICD-10-CM | POA: Diagnosis not present

## 2020-08-03 DIAGNOSIS — K651 Peritoneal abscess: Secondary | ICD-10-CM | POA: Diagnosis not present

## 2020-08-03 DIAGNOSIS — R918 Other nonspecific abnormal finding of lung field: Secondary | ICD-10-CM | POA: Diagnosis not present

## 2020-08-03 DIAGNOSIS — I2510 Atherosclerotic heart disease of native coronary artery without angina pectoris: Secondary | ICD-10-CM | POA: Diagnosis present

## 2020-08-03 DIAGNOSIS — K668 Other specified disorders of peritoneum: Secondary | ICD-10-CM | POA: Diagnosis not present

## 2020-08-04 DIAGNOSIS — R578 Other shock: Secondary | ICD-10-CM | POA: Diagnosis not present

## 2020-08-04 DIAGNOSIS — C187 Malignant neoplasm of sigmoid colon: Secondary | ICD-10-CM | POA: Diagnosis not present

## 2020-08-04 DIAGNOSIS — E861 Hypovolemia: Secondary | ICD-10-CM | POA: Diagnosis not present

## 2020-08-04 DIAGNOSIS — D689 Coagulation defect, unspecified: Secondary | ICD-10-CM | POA: Diagnosis not present

## 2020-08-04 DIAGNOSIS — R0689 Other abnormalities of breathing: Secondary | ICD-10-CM | POA: Diagnosis not present

## 2020-08-04 DIAGNOSIS — K91840 Postprocedural hemorrhage and hematoma of a digestive system organ or structure following a digestive system procedure: Secondary | ICD-10-CM | POA: Diagnosis not present

## 2020-08-05 DIAGNOSIS — R918 Other nonspecific abnormal finding of lung field: Secondary | ICD-10-CM | POA: Diagnosis not present

## 2020-08-05 DIAGNOSIS — D689 Coagulation defect, unspecified: Secondary | ICD-10-CM | POA: Diagnosis not present

## 2020-08-05 DIAGNOSIS — U071 COVID-19: Secondary | ICD-10-CM | POA: Diagnosis not present

## 2020-08-05 DIAGNOSIS — J90 Pleural effusion, not elsewhere classified: Secondary | ICD-10-CM | POA: Diagnosis not present

## 2020-08-05 DIAGNOSIS — R578 Other shock: Secondary | ICD-10-CM | POA: Diagnosis not present

## 2020-08-06 DIAGNOSIS — R0600 Dyspnea, unspecified: Secondary | ICD-10-CM | POA: Diagnosis not present

## 2020-08-06 DIAGNOSIS — J849 Interstitial pulmonary disease, unspecified: Secondary | ICD-10-CM | POA: Diagnosis not present

## 2020-08-06 DIAGNOSIS — J449 Chronic obstructive pulmonary disease, unspecified: Secondary | ICD-10-CM | POA: Diagnosis not present

## 2020-08-06 DIAGNOSIS — N179 Acute kidney failure, unspecified: Secondary | ICD-10-CM | POA: Diagnosis not present

## 2020-08-06 DIAGNOSIS — R000 Tachycardia, unspecified: Secondary | ICD-10-CM | POA: Diagnosis not present

## 2020-08-06 DIAGNOSIS — Z952 Presence of prosthetic heart valve: Secondary | ICD-10-CM | POA: Diagnosis not present

## 2020-08-07 DIAGNOSIS — J90 Pleural effusion, not elsewhere classified: Secondary | ICD-10-CM | POA: Diagnosis not present

## 2020-08-07 DIAGNOSIS — R112 Nausea with vomiting, unspecified: Secondary | ICD-10-CM | POA: Diagnosis not present

## 2020-08-08 DIAGNOSIS — J9811 Atelectasis: Secondary | ICD-10-CM | POA: Diagnosis not present

## 2020-08-08 DIAGNOSIS — R918 Other nonspecific abnormal finding of lung field: Secondary | ICD-10-CM | POA: Diagnosis not present

## 2020-08-08 DIAGNOSIS — J90 Pleural effusion, not elsewhere classified: Secondary | ICD-10-CM | POA: Diagnosis not present

## 2020-08-09 DIAGNOSIS — K560 Paralytic ileus: Secondary | ICD-10-CM | POA: Diagnosis not present

## 2020-08-09 DIAGNOSIS — K769 Liver disease, unspecified: Secondary | ICD-10-CM | POA: Diagnosis not present

## 2020-08-13 DIAGNOSIS — D72829 Elevated white blood cell count, unspecified: Secondary | ICD-10-CM | POA: Diagnosis not present

## 2020-08-15 DIAGNOSIS — R000 Tachycardia, unspecified: Secondary | ICD-10-CM | POA: Diagnosis not present

## 2020-08-15 DIAGNOSIS — K5900 Constipation, unspecified: Secondary | ICD-10-CM | POA: Diagnosis not present

## 2020-08-15 DIAGNOSIS — I4949 Other premature depolarization: Secondary | ICD-10-CM | POA: Diagnosis not present

## 2020-08-16 DIAGNOSIS — K5900 Constipation, unspecified: Secondary | ICD-10-CM | POA: Diagnosis not present

## 2020-08-17 DIAGNOSIS — D7389 Other diseases of spleen: Secondary | ICD-10-CM | POA: Diagnosis not present

## 2020-08-17 DIAGNOSIS — K651 Peritoneal abscess: Secondary | ICD-10-CM | POA: Diagnosis not present

## 2020-08-17 DIAGNOSIS — C3490 Malignant neoplasm of unspecified part of unspecified bronchus or lung: Secondary | ICD-10-CM | POA: Diagnosis not present

## 2020-08-17 DIAGNOSIS — R188 Other ascites: Secondary | ICD-10-CM | POA: Diagnosis not present

## 2020-08-28 ENCOUNTER — Ambulatory Visit (INDEPENDENT_AMBULATORY_CARE_PROVIDER_SITE_OTHER): Payer: Medicare Other | Admitting: *Deleted

## 2020-08-28 DIAGNOSIS — Z952 Presence of prosthetic heart valve: Secondary | ICD-10-CM

## 2020-08-28 DIAGNOSIS — Z5181 Encounter for therapeutic drug level monitoring: Secondary | ICD-10-CM | POA: Diagnosis not present

## 2020-08-28 LAB — POCT INR: INR: 2 (ref 2.0–3.0)

## 2020-08-28 NOTE — Patient Instructions (Signed)
D/C from [facility] 08/23/20 on warfarin 2mg  daily.  Finished levaquin yesterday.  Will finish Flagyl tomorrow. Continue warfarin 2mg  daily Stop Lovenox injections Recheck in 1 week

## 2020-09-03 ENCOUNTER — Ambulatory Visit (INDEPENDENT_AMBULATORY_CARE_PROVIDER_SITE_OTHER): Payer: Medicare Other | Admitting: *Deleted

## 2020-09-03 DIAGNOSIS — Z5181 Encounter for therapeutic drug level monitoring: Secondary | ICD-10-CM | POA: Diagnosis not present

## 2020-09-03 DIAGNOSIS — Z952 Presence of prosthetic heart valve: Secondary | ICD-10-CM | POA: Diagnosis not present

## 2020-09-03 LAB — POCT INR: INR: 4 — AB (ref 2.0–3.0)

## 2020-09-03 NOTE — Patient Instructions (Signed)
Hold warfarin tonight then decrease dose to 2mg  daily except 1mg  on Tuesdays and Fridays Recheck in 1 week

## 2020-09-06 DIAGNOSIS — Z09 Encounter for follow-up examination after completed treatment for conditions other than malignant neoplasm: Secondary | ICD-10-CM | POA: Diagnosis not present

## 2020-09-06 DIAGNOSIS — C3490 Malignant neoplasm of unspecified part of unspecified bronchus or lung: Secondary | ICD-10-CM | POA: Diagnosis not present

## 2020-09-10 ENCOUNTER — Ambulatory Visit (INDEPENDENT_AMBULATORY_CARE_PROVIDER_SITE_OTHER): Payer: Medicare Other | Admitting: *Deleted

## 2020-09-10 DIAGNOSIS — Z952 Presence of prosthetic heart valve: Secondary | ICD-10-CM

## 2020-09-10 DIAGNOSIS — Z5181 Encounter for therapeutic drug level monitoring: Secondary | ICD-10-CM | POA: Diagnosis not present

## 2020-09-10 LAB — POCT INR: INR: 1.7 — AB (ref 2.0–3.0)

## 2020-09-10 NOTE — Patient Instructions (Signed)
Increase warfarin to 2mg  daily  Recheck in 1 week

## 2020-09-12 DIAGNOSIS — Z681 Body mass index (BMI) 19 or less, adult: Secondary | ICD-10-CM | POA: Diagnosis not present

## 2020-09-12 DIAGNOSIS — D500 Iron deficiency anemia secondary to blood loss (chronic): Secondary | ICD-10-CM | POA: Diagnosis not present

## 2020-09-12 DIAGNOSIS — Z7901 Long term (current) use of anticoagulants: Secondary | ICD-10-CM | POA: Diagnosis not present

## 2020-09-12 DIAGNOSIS — Z952 Presence of prosthetic heart valve: Secondary | ICD-10-CM | POA: Diagnosis not present

## 2020-09-12 DIAGNOSIS — Z7189 Other specified counseling: Secondary | ICD-10-CM | POA: Diagnosis not present

## 2020-09-12 DIAGNOSIS — R948 Abnormal results of function studies of other organs and systems: Secondary | ICD-10-CM | POA: Diagnosis not present

## 2020-09-12 DIAGNOSIS — C3401 Malignant neoplasm of right main bronchus: Secondary | ICD-10-CM | POA: Diagnosis not present

## 2020-09-12 DIAGNOSIS — C7880 Secondary malignant neoplasm of unspecified digestive organ: Secondary | ICD-10-CM | POA: Diagnosis not present

## 2020-09-14 ENCOUNTER — Telehealth: Payer: Self-pay | Admitting: Family Medicine

## 2020-09-14 NOTE — Telephone Encounter (Signed)
Attempted to call office again to get clearance sent over.  Voice message said office was already closed for the day

## 2020-09-14 NOTE — Telephone Encounter (Signed)
Patient called stating that she is going to start Lovenox injections on 09/16/2020. She has questions about her coumadin. [phone].

## 2020-09-14 NOTE — Telephone Encounter (Addendum)
Returned a call to the pt and she states that she was told by her oncology  doctor she needed to stop her warfarin to have a port placed. Asked if she had been given instructions regarding the warfarin dose and she stated no they just told her to stop and start lovenox. Advised that we like to have information to ensure proper clearance. She stated she could give a number and she could get it after she could look in her phone and asked to call her back after she got it. So waiting a few minutes before calling her back to get the information. Pt states the procedure will be on 09/21/2020. Called pt back and received a number to call  [phone], the administrative assistant transferred me to Eric Form RN line to leave a message for details. Will await a response.  Pt has 3 boxes of both Lovenox 40mg  and 60mg  at home.  Last weight at [facility] was 48.6kg on 3/2/222

## 2020-09-14 NOTE — Telephone Encounter (Signed)
Spoke with pt again and she states she will need to reschedule the appt on Monday, 09/17/20 since she will be holding her Warfarin. She is aware that we have not received a call back from the Interventional Clinic. Pt states she will stop her Warfarin after her last dose on 09/15/20 and start her Lovenox on 09/17/20 and use twice as day as she states she has done before. I moved her appt for 09/17/20 (will be holding already) to 09/27/20, date she was available and after procedure/ restarting warfarin. She is aware that since Oncology has given her the instructions then she should follow for the procedure and that Cardiology has not received anything at this time to verify procedure. Will follow up.

## 2020-09-17 NOTE — Telephone Encounter (Signed)
Called pt to update her and pt states she spoke with Annie Main RN with Interventional and he advised her that she does not need to be bridged or stop warfarin as long as INR was less than 3.0; she stated she told him she already started it and prefers to bridge to ensure she is low enough to have procedure. She had Lovenox from her previous managing office. Advised that we could get her restarted on Warfarin today since she has only missed a day of Warfarin and she stated she prefers to continue her bridge and is aware of the risks and if anything was to happen she would be responsible. She stated after having her esophagus stretched over 14 times and using Lovenox she does not mind. Pt is aware she will need to adhere to her next appt since she is going to stay off and complete her bridge under previous/own advice. Also, advised in the future to let us know of procedures as soon as she finds out so we can ensure proper clearance. Appt is set for next week in [place]. Will update [place] office [employer] as well.

## 2020-09-17 NOTE — Telephone Encounter (Signed)
[facility] called regarding patient's port placement. Reports there is no need to hold warfarin or bridge.  MD stated it is not a high risk procedure and they will check her INR that morning.  They will go through with the procedure as long as her INR is <3.

## 2020-09-21 DIAGNOSIS — Z79890 Hormone replacement therapy (postmenopausal): Secondary | ICD-10-CM | POA: Diagnosis not present

## 2020-09-21 DIAGNOSIS — K219 Gastro-esophageal reflux disease without esophagitis: Secondary | ICD-10-CM | POA: Diagnosis not present

## 2020-09-21 DIAGNOSIS — C3490 Malignant neoplasm of unspecified part of unspecified bronchus or lung: Secondary | ICD-10-CM | POA: Diagnosis not present

## 2020-09-21 DIAGNOSIS — J439 Emphysema, unspecified: Secondary | ICD-10-CM | POA: Diagnosis not present

## 2020-09-21 DIAGNOSIS — C3401 Malignant neoplasm of right main bronchus: Secondary | ICD-10-CM | POA: Diagnosis not present

## 2020-09-21 DIAGNOSIS — M810 Age-related osteoporosis without current pathological fracture: Secondary | ICD-10-CM | POA: Diagnosis not present

## 2020-09-21 DIAGNOSIS — Z885 Allergy status to narcotic agent status: Secondary | ICD-10-CM | POA: Diagnosis not present

## 2020-09-21 DIAGNOSIS — Z952 Presence of prosthetic heart valve: Secondary | ICD-10-CM | POA: Diagnosis not present

## 2020-09-21 DIAGNOSIS — Z452 Encounter for adjustment and management of vascular access device: Secondary | ICD-10-CM | POA: Diagnosis not present

## 2020-09-21 DIAGNOSIS — Z7901 Long term (current) use of anticoagulants: Secondary | ICD-10-CM | POA: Diagnosis not present

## 2020-09-21 DIAGNOSIS — Z79899 Other long term (current) drug therapy: Secondary | ICD-10-CM | POA: Diagnosis not present

## 2020-09-21 DIAGNOSIS — Z87891 Personal history of nicotine dependence: Secondary | ICD-10-CM | POA: Diagnosis not present

## 2020-09-21 DIAGNOSIS — Z7982 Long term (current) use of aspirin: Secondary | ICD-10-CM | POA: Diagnosis not present

## 2020-09-21 DIAGNOSIS — E079 Disorder of thyroid, unspecified: Secondary | ICD-10-CM | POA: Diagnosis not present

## 2020-09-21 DIAGNOSIS — I2510 Atherosclerotic heart disease of native coronary artery without angina pectoris: Secondary | ICD-10-CM | POA: Diagnosis not present

## 2020-09-21 DIAGNOSIS — I252 Old myocardial infarction: Secondary | ICD-10-CM | POA: Diagnosis not present

## 2020-09-27 ENCOUNTER — Other Ambulatory Visit: Payer: Self-pay

## 2020-09-27 ENCOUNTER — Ambulatory Visit (INDEPENDENT_AMBULATORY_CARE_PROVIDER_SITE_OTHER): Payer: Medicare Other | Admitting: *Deleted

## 2020-09-27 DIAGNOSIS — Z952 Presence of prosthetic heart valve: Secondary | ICD-10-CM

## 2020-09-27 DIAGNOSIS — Z5181 Encounter for therapeutic drug level monitoring: Secondary | ICD-10-CM

## 2020-09-27 DIAGNOSIS — C187 Malignant neoplasm of sigmoid colon: Secondary | ICD-10-CM | POA: Diagnosis not present

## 2020-09-27 LAB — POCT INR: INR: 1.8 — AB (ref 2.0–3.0)

## 2020-09-27 NOTE — Patient Instructions (Signed)
Increase warfarin to 2mg  daily except 3mg  on Thursdays Recheck in 1 week

## 2020-09-28 DIAGNOSIS — E039 Hypothyroidism, unspecified: Secondary | ICD-10-CM | POA: Diagnosis not present

## 2020-09-28 DIAGNOSIS — I2510 Atherosclerotic heart disease of native coronary artery without angina pectoris: Secondary | ICD-10-CM | POA: Diagnosis not present

## 2020-09-28 DIAGNOSIS — E538 Deficiency of other specified B group vitamins: Secondary | ICD-10-CM | POA: Diagnosis not present

## 2020-09-28 DIAGNOSIS — Z681 Body mass index (BMI) 19 or less, adult: Secondary | ICD-10-CM | POA: Diagnosis not present

## 2020-09-28 DIAGNOSIS — C785 Secondary malignant neoplasm of large intestine and rectum: Secondary | ICD-10-CM | POA: Diagnosis not present

## 2020-09-28 DIAGNOSIS — D649 Anemia, unspecified: Secondary | ICD-10-CM | POA: Diagnosis not present

## 2020-10-01 DIAGNOSIS — C187 Malignant neoplasm of sigmoid colon: Secondary | ICD-10-CM | POA: Diagnosis not present

## 2020-10-03 DIAGNOSIS — C187 Malignant neoplasm of sigmoid colon: Secondary | ICD-10-CM | POA: Diagnosis not present

## 2020-10-04 ENCOUNTER — Ambulatory Visit (INDEPENDENT_AMBULATORY_CARE_PROVIDER_SITE_OTHER): Payer: Medicare Other | Admitting: Pharmacist

## 2020-10-04 DIAGNOSIS — Z5181 Encounter for therapeutic drug level monitoring: Secondary | ICD-10-CM

## 2020-10-04 DIAGNOSIS — Z952 Presence of prosthetic heart valve: Secondary | ICD-10-CM

## 2020-10-04 LAB — POCT INR: INR: 2 (ref 2.0–3.0)

## 2020-10-04 NOTE — Patient Instructions (Addendum)
Description   Continue warfarin 2mg  daily except 3mg  on Thursdays. Recheck in 2 weeks

## 2020-10-09 DIAGNOSIS — C3401 Malignant neoplasm of right main bronchus: Secondary | ICD-10-CM | POA: Diagnosis not present

## 2020-10-09 DIAGNOSIS — Z789 Other specified health status: Secondary | ICD-10-CM | POA: Diagnosis not present

## 2020-10-09 DIAGNOSIS — Z952 Presence of prosthetic heart valve: Secondary | ICD-10-CM | POA: Diagnosis not present

## 2020-10-09 DIAGNOSIS — Z7901 Long term (current) use of anticoagulants: Secondary | ICD-10-CM | POA: Diagnosis not present

## 2020-10-09 DIAGNOSIS — J9859 Other diseases of mediastinum, not elsewhere classified: Secondary | ICD-10-CM | POA: Diagnosis not present

## 2020-10-09 DIAGNOSIS — Z7189 Other specified counseling: Secondary | ICD-10-CM | POA: Diagnosis not present

## 2020-10-09 DIAGNOSIS — C7880 Secondary malignant neoplasm of unspecified digestive organ: Secondary | ICD-10-CM | POA: Diagnosis not present

## 2020-10-09 DIAGNOSIS — R634 Abnormal weight loss: Secondary | ICD-10-CM | POA: Diagnosis not present

## 2020-10-09 DIAGNOSIS — Z681 Body mass index (BMI) 19 or less, adult: Secondary | ICD-10-CM | POA: Diagnosis not present

## 2020-10-18 ENCOUNTER — Other Ambulatory Visit: Payer: Self-pay

## 2020-10-18 ENCOUNTER — Ambulatory Visit (INDEPENDENT_AMBULATORY_CARE_PROVIDER_SITE_OTHER): Payer: Medicare Other | Admitting: *Deleted

## 2020-10-18 DIAGNOSIS — Z5181 Encounter for therapeutic drug level monitoring: Secondary | ICD-10-CM | POA: Diagnosis not present

## 2020-10-18 DIAGNOSIS — Z952 Presence of prosthetic heart valve: Secondary | ICD-10-CM

## 2020-10-18 LAB — POCT INR: INR: 1.7 — AB (ref 2.0–3.0)

## 2020-10-18 MED ORDER — WARFARIN SODIUM 3 MG PO TABS: ORAL_TABLET | ORAL | 3 refills | Status: DC

## 2020-10-18 MED ORDER — WARFARIN SODIUM 2 MG PO TABS: ORAL_TABLET | ORAL | 3 refills | Status: DC

## 2020-10-18 NOTE — Patient Instructions (Signed)
Take warfarin 4mg  tonight then increase dose to 2mg  daily except 3mg  on Tuesdays, Thursdays and Saturdays. Recheck in 2 weeks

## 2020-10-19 DIAGNOSIS — J9859 Other diseases of mediastinum, not elsewhere classified: Secondary | ICD-10-CM | POA: Diagnosis not present

## 2020-10-19 DIAGNOSIS — C3401 Malignant neoplasm of right main bronchus: Secondary | ICD-10-CM | POA: Diagnosis not present

## 2020-10-19 DIAGNOSIS — E039 Hypothyroidism, unspecified: Secondary | ICD-10-CM | POA: Diagnosis not present

## 2020-10-22 DIAGNOSIS — C3401 Malignant neoplasm of right main bronchus: Secondary | ICD-10-CM | POA: Diagnosis not present

## 2020-10-22 DIAGNOSIS — Z5112 Encounter for antineoplastic immunotherapy: Secondary | ICD-10-CM | POA: Diagnosis not present

## 2020-10-22 DIAGNOSIS — R634 Abnormal weight loss: Secondary | ICD-10-CM | POA: Diagnosis not present

## 2020-10-22 DIAGNOSIS — J9859 Other diseases of mediastinum, not elsewhere classified: Secondary | ICD-10-CM | POA: Diagnosis not present

## 2020-10-29 DIAGNOSIS — E538 Deficiency of other specified B group vitamins: Secondary | ICD-10-CM | POA: Diagnosis not present

## 2020-10-31 ENCOUNTER — Telehealth: Payer: Self-pay | Admitting: Family Medicine

## 2020-10-31 MED ORDER — AMOXICILLIN 500 MG PO CAPS: ORAL_CAPSULE | ORAL | 3 refills | Status: DC

## 2020-10-31 NOTE — Telephone Encounter (Signed)
   [facility] Pre-operative Risk Assessment    Patient Name: Judy Sawyer  DOB: Nov 09, 1955  MRN: [id number]   HEARTCARE STAFF: - Please ensure there is not already an duplicate clearance open for this procedure. - Under Visit Info/Reason for Call, type in Other and utilize the format Clearance MM/DD/YY or Clearance TBD. Do not use dashes or single digits. - If request is for dental extraction, please clarify the # of teeth to be extracted.  Request for surgical clearance:  1. What type of surgery is being performed? Ext tooth#s  5-13, 20-31 (21 exts) arch allveoloplasty, full upper & lower immediate dentures   2. When is this surgery scheduled? TBD  3. What type of clearance is required (medical clearance vs. Pharmacy clearance to hold med vs. Both) Both -undergoing immunotherapy treatment for Cancer   4. Are there any medications that need to be held prior to surgery and how long? Warfarin   5. Practice name and name of physician performing surgery? Dr. Basil Dess  6. What is the office phone number? [phone]   7.   What is the office fax number? [phone]  8.   Anesthesia type (None, local, MAC, general) ? Does not indicate on referral    Chanda Busing 10/31/2020, 9:45 AM  _________________________________________________________________   (provider comments below)

## 2020-10-31 NOTE — Telephone Encounter (Signed)
Patient with diagnosis of mechanical AVR and active cancer on warfarin for anticoagulation. Anticoagulation previously managed by heme/onc at [facility]. Pt has been on Lovenox long term in the past. Per their 07/04/20 note, "Anticoagulation for mechanical Aortic Valve: Patient is a slow metabolizer of coumadin. Maintain on Lovenox 40 mg sq q 12 hrs while undergoing serial dilation. Between dilations to receive Coumadin 2.0 to 2.5 mg daily with vitamin D 1000 units and vitamin K 100 mcg daily to help regulate INR. Goal INR is 2.0-3.0. INRs become are strikingly elevated in the setting of of dysphagia/odynophagia, use of steroids and abx. Have turned coumadin management over to Cardiology." [facility] in [place] has been managing patient's INRs since 05/15/20, but looks like heme/onc still managed bridge for endoscopy 07/24/20.  Procedure: Ext tooth#s  5-13, 20-31 (21 exts) arch allveoloplasty, full upper & lower immediate dentures   Date of procedure: TBD  CrCl 48mL/min Platelet count 472K  Patient does require pre-op antibiotics for dental procedure due to her history of AVR, please let pt know I have sent rx for amoxicillin into her pharmacy.  She'll require bridging with Lovenox for this procedure as well. [facility]Care is currently managing INRs so will forward to [place], [place] since pt is managed in [place]. Not sure if heme/onc plans to be involved with any future Lovenox bridges.

## 2020-10-31 NOTE — Telephone Encounter (Signed)
   Name: Judy Sawyer  DOB: 1956-02-04  MRN: [id number]   Primary Cardiologist: No primary care provider on file.  Chart reviewed as part of pre-operative protocol coverage. Patient was contacted 10/31/2020 in reference to pre-operative risk assessment for pending surgery as outlined below.  Judy Sawyer was last seen on 06/15/2020 by Levell July NP.  Since that day, Judy Sawyer has done well without exertional chest discomfort or shortness of breath.  Myoview obtained in October 2021 was low risk  Therefore, based on ACC/AHA guidelines, the patient would be at acceptable risk for the planned procedure without further cardiovascular testing.   Note, see our clinical pharmacist's recommendation below.  Patient will need both Lovenox bridging while off of Coumadin therapy and also SBE prophylaxis.  Amoxicillin prescription has been sent to her pharmacy and instruction has been given to the patient on how to take it.  Once the date of the procedure has been set, patient will need to contact our Coumadin clinic in [place] in order to receive the instruction on how to do the Lovenox bridging.  The patient was advised that if she develops new symptoms prior to surgery to contact our office to arrange for a follow-up visit, and she verbalized understanding.  I will route this recommendation to the requesting party via Epic fax function and remove from pre-op pool. Please call with questions.  [place], [place] 10/31/2020, 3:20 PM

## 2020-10-31 NOTE — Telephone Encounter (Signed)
Clinical pharmacist to review Coumadin.  Note patient has mechanical aortic valve.

## 2020-10-31 NOTE — Telephone Encounter (Signed)
I have called and explained to the patient that her thoracic aortic aneurysm will not affect dental procedure.  She is due for yearly imaging.  The next time she is due for a recheck it would be in November 2022.

## 2020-10-31 NOTE — Telephone Encounter (Signed)
Patient called asking to speak with Almyra Deforest PA.  Wants to know if aortic aneurysm needs to be reassessed before planning her dental surgery.  She forgot to ask.  Please call pt at [phone]. Thanks

## 2020-11-01 ENCOUNTER — Ambulatory Visit (INDEPENDENT_AMBULATORY_CARE_PROVIDER_SITE_OTHER): Payer: Medicare Other | Admitting: *Deleted

## 2020-11-01 ENCOUNTER — Other Ambulatory Visit: Payer: Self-pay

## 2020-11-01 DIAGNOSIS — Z952 Presence of prosthetic heart valve: Secondary | ICD-10-CM | POA: Diagnosis not present

## 2020-11-01 DIAGNOSIS — Z5181 Encounter for therapeutic drug level monitoring: Secondary | ICD-10-CM

## 2020-11-01 LAB — POCT INR: INR: 1.8 — AB (ref 2.0–3.0)

## 2020-11-01 NOTE — Patient Instructions (Signed)
Increase warfarin to 3mg  daily except 2mg  on Sundays and Wednesdays. Pending endoscopy at [facility] on 11/27/20.  Will hold warfarin 5 days before procedure and bridge with Lovenox. Recheck in 2 weeks

## 2020-11-02 DIAGNOSIS — J9859 Other diseases of mediastinum, not elsewhere classified: Secondary | ICD-10-CM | POA: Diagnosis not present

## 2020-11-02 DIAGNOSIS — C3401 Malignant neoplasm of right main bronchus: Secondary | ICD-10-CM | POA: Diagnosis not present

## 2020-11-05 DIAGNOSIS — C3401 Malignant neoplasm of right main bronchus: Secondary | ICD-10-CM | POA: Diagnosis not present

## 2020-11-05 DIAGNOSIS — Z5112 Encounter for antineoplastic immunotherapy: Secondary | ICD-10-CM | POA: Diagnosis not present

## 2020-11-05 DIAGNOSIS — J9859 Other diseases of mediastinum, not elsewhere classified: Secondary | ICD-10-CM | POA: Diagnosis not present

## 2020-11-05 DIAGNOSIS — R634 Abnormal weight loss: Secondary | ICD-10-CM | POA: Diagnosis not present

## 2020-11-15 DIAGNOSIS — Z952 Presence of prosthetic heart valve: Secondary | ICD-10-CM | POA: Diagnosis not present

## 2020-11-15 DIAGNOSIS — K029 Dental caries, unspecified: Secondary | ICD-10-CM | POA: Diagnosis not present

## 2020-11-15 DIAGNOSIS — Z7901 Long term (current) use of anticoagulants: Secondary | ICD-10-CM | POA: Diagnosis not present

## 2020-11-15 DIAGNOSIS — C3401 Malignant neoplasm of right main bronchus: Secondary | ICD-10-CM | POA: Diagnosis not present

## 2020-11-15 DIAGNOSIS — Z95828 Presence of other vascular implants and grafts: Secondary | ICD-10-CM | POA: Diagnosis not present

## 2020-11-15 DIAGNOSIS — Z09 Encounter for follow-up examination after completed treatment for conditions other than malignant neoplasm: Secondary | ICD-10-CM | POA: Diagnosis not present

## 2020-11-15 DIAGNOSIS — C7880 Secondary malignant neoplasm of unspecified digestive organ: Secondary | ICD-10-CM | POA: Diagnosis not present

## 2020-11-15 DIAGNOSIS — Z681 Body mass index (BMI) 19 or less, adult: Secondary | ICD-10-CM | POA: Diagnosis not present

## 2020-11-15 DIAGNOSIS — E039 Hypothyroidism, unspecified: Secondary | ICD-10-CM | POA: Diagnosis not present

## 2020-11-15 DIAGNOSIS — J9859 Other diseases of mediastinum, not elsewhere classified: Secondary | ICD-10-CM | POA: Diagnosis not present

## 2020-11-15 DIAGNOSIS — Z7189 Other specified counseling: Secondary | ICD-10-CM | POA: Diagnosis not present

## 2020-11-19 DIAGNOSIS — J9859 Other diseases of mediastinum, not elsewhere classified: Secondary | ICD-10-CM | POA: Diagnosis not present

## 2020-11-19 DIAGNOSIS — R634 Abnormal weight loss: Secondary | ICD-10-CM | POA: Diagnosis not present

## 2020-11-19 DIAGNOSIS — Z5111 Encounter for antineoplastic chemotherapy: Secondary | ICD-10-CM | POA: Diagnosis not present

## 2020-11-19 DIAGNOSIS — C3401 Malignant neoplasm of right main bronchus: Secondary | ICD-10-CM | POA: Diagnosis not present

## 2020-11-19 DIAGNOSIS — Z5112 Encounter for antineoplastic immunotherapy: Secondary | ICD-10-CM | POA: Diagnosis not present

## 2020-11-20 ENCOUNTER — Ambulatory Visit (INDEPENDENT_AMBULATORY_CARE_PROVIDER_SITE_OTHER): Payer: Medicare Other | Admitting: *Deleted

## 2020-11-20 DIAGNOSIS — Z5181 Encounter for therapeutic drug level monitoring: Secondary | ICD-10-CM

## 2020-11-20 DIAGNOSIS — Z952 Presence of prosthetic heart valve: Secondary | ICD-10-CM

## 2020-11-20 LAB — POCT INR: INR: 1.8 — AB (ref 2.0–3.0)

## 2020-11-20 NOTE — Patient Instructions (Addendum)
Increase warfarin to 3mg  daily Pending endoscopy at [facility] on 11/27/20.  Will hold warfarin 5 days before procedure and bridge with Lovenox.  Take last dose of warfarin on 5/11.  Start Lovenox twice daily on 5/12.  After surgery resume warfarin 3mg  daily and continue Lovenox twice daily until INR check in  Recheck on 5/23  Pending Endoscopy at [facility] on 5/17 Lovenox 40mg  twice daily as ordered by Gaspar Cola  (Has shots at home)  5/11  Take last dose of warfarin  5/12  No warfarin or Lovenox 5/13 - 5/16  Start Lovenox twice daily am and pm 5/17  No Lovenox -----procedure-------restart warfarin 3mg  daily in pm 5/18 - 5/23  Take Lovenox twice daily am and pm along with warfarin 3mg  every pm 5/23  INR check

## 2020-11-26 DIAGNOSIS — E538 Deficiency of other specified B group vitamins: Secondary | ICD-10-CM | POA: Diagnosis not present

## 2020-11-27 DIAGNOSIS — Z91011 Allergy to milk products: Secondary | ICD-10-CM | POA: Diagnosis not present

## 2020-11-27 DIAGNOSIS — J439 Emphysema, unspecified: Secondary | ICD-10-CM | POA: Diagnosis not present

## 2020-11-27 DIAGNOSIS — R0600 Dyspnea, unspecified: Secondary | ICD-10-CM | POA: Diagnosis not present

## 2020-11-27 DIAGNOSIS — I10 Essential (primary) hypertension: Secondary | ICD-10-CM | POA: Diagnosis not present

## 2020-11-27 DIAGNOSIS — Z923 Personal history of irradiation: Secondary | ICD-10-CM | POA: Diagnosis not present

## 2020-11-27 DIAGNOSIS — K449 Diaphragmatic hernia without obstruction or gangrene: Secondary | ICD-10-CM | POA: Diagnosis not present

## 2020-11-27 DIAGNOSIS — K2289 Other specified disease of esophagus: Secondary | ICD-10-CM | POA: Diagnosis not present

## 2020-11-27 DIAGNOSIS — K219 Gastro-esophageal reflux disease without esophagitis: Secondary | ICD-10-CM | POA: Diagnosis not present

## 2020-11-27 DIAGNOSIS — Z87891 Personal history of nicotine dependence: Secondary | ICD-10-CM | POA: Diagnosis not present

## 2020-11-27 DIAGNOSIS — K222 Esophageal obstruction: Secondary | ICD-10-CM | POA: Diagnosis not present

## 2020-11-27 DIAGNOSIS — Z9221 Personal history of antineoplastic chemotherapy: Secondary | ICD-10-CM | POA: Diagnosis not present

## 2020-11-27 DIAGNOSIS — M26629 Arthralgia of temporomandibular joint, unspecified side: Secondary | ICD-10-CM | POA: Diagnosis not present

## 2020-11-27 DIAGNOSIS — I252 Old myocardial infarction: Secondary | ICD-10-CM | POA: Diagnosis not present

## 2020-11-27 DIAGNOSIS — Z885 Allergy status to narcotic agent status: Secondary | ICD-10-CM | POA: Diagnosis not present

## 2020-11-27 DIAGNOSIS — Z85118 Personal history of other malignant neoplasm of bronchus and lung: Secondary | ICD-10-CM | POA: Diagnosis not present

## 2020-11-27 DIAGNOSIS — I2510 Atherosclerotic heart disease of native coronary artery without angina pectoris: Secondary | ICD-10-CM | POA: Diagnosis not present

## 2020-11-27 DIAGNOSIS — E039 Hypothyroidism, unspecified: Secondary | ICD-10-CM | POA: Diagnosis not present

## 2020-11-30 DIAGNOSIS — D500 Iron deficiency anemia secondary to blood loss (chronic): Secondary | ICD-10-CM | POA: Diagnosis not present

## 2020-11-30 DIAGNOSIS — C7880 Secondary malignant neoplasm of unspecified digestive organ: Secondary | ICD-10-CM | POA: Diagnosis not present

## 2020-11-30 DIAGNOSIS — Z09 Encounter for follow-up examination after completed treatment for conditions other than malignant neoplasm: Secondary | ICD-10-CM | POA: Diagnosis not present

## 2020-11-30 DIAGNOSIS — E039 Hypothyroidism, unspecified: Secondary | ICD-10-CM | POA: Diagnosis not present

## 2020-11-30 DIAGNOSIS — C3401 Malignant neoplasm of right main bronchus: Secondary | ICD-10-CM | POA: Diagnosis not present

## 2020-12-01 DIAGNOSIS — Z23 Encounter for immunization: Secondary | ICD-10-CM | POA: Diagnosis not present

## 2020-12-03 DIAGNOSIS — Z5112 Encounter for antineoplastic immunotherapy: Secondary | ICD-10-CM | POA: Diagnosis not present

## 2020-12-03 DIAGNOSIS — C3401 Malignant neoplasm of right main bronchus: Secondary | ICD-10-CM | POA: Diagnosis not present

## 2020-12-03 DIAGNOSIS — J9859 Other diseases of mediastinum, not elsewhere classified: Secondary | ICD-10-CM | POA: Diagnosis not present

## 2020-12-03 DIAGNOSIS — R634 Abnormal weight loss: Secondary | ICD-10-CM | POA: Diagnosis not present

## 2020-12-04 ENCOUNTER — Ambulatory Visit (INDEPENDENT_AMBULATORY_CARE_PROVIDER_SITE_OTHER): Payer: Medicare Other | Admitting: *Deleted

## 2020-12-04 DIAGNOSIS — Z952 Presence of prosthetic heart valve: Secondary | ICD-10-CM | POA: Diagnosis not present

## 2020-12-04 DIAGNOSIS — Z5181 Encounter for therapeutic drug level monitoring: Secondary | ICD-10-CM | POA: Diagnosis not present

## 2020-12-04 LAB — POCT INR: INR: 2.5 (ref 2.0–3.0)

## 2020-12-04 NOTE — Patient Instructions (Signed)
Continue warfarin to 3mg  daily Stop Lovenox injections Pending full dental extractions.  Date TBD  Will hold warfarin 5 days and bridge with Lovenox Recheck in 2 weeks

## 2020-12-11 DIAGNOSIS — M810 Age-related osteoporosis without current pathological fracture: Secondary | ICD-10-CM | POA: Diagnosis not present

## 2020-12-11 DIAGNOSIS — S299XXS Unspecified injury of thorax, sequela: Secondary | ICD-10-CM | POA: Diagnosis not present

## 2020-12-11 DIAGNOSIS — R0781 Pleurodynia: Secondary | ICD-10-CM | POA: Diagnosis not present

## 2020-12-11 DIAGNOSIS — S299XXA Unspecified injury of thorax, initial encounter: Secondary | ICD-10-CM | POA: Diagnosis not present

## 2020-12-14 DIAGNOSIS — C3401 Malignant neoplasm of right main bronchus: Secondary | ICD-10-CM | POA: Diagnosis not present

## 2020-12-14 DIAGNOSIS — C7880 Secondary malignant neoplasm of unspecified digestive organ: Secondary | ICD-10-CM | POA: Diagnosis not present

## 2020-12-14 DIAGNOSIS — J9859 Other diseases of mediastinum, not elsewhere classified: Secondary | ICD-10-CM | POA: Diagnosis not present

## 2020-12-14 DIAGNOSIS — Z681 Body mass index (BMI) 19 or less, adult: Secondary | ICD-10-CM | POA: Diagnosis not present

## 2020-12-14 DIAGNOSIS — E039 Hypothyroidism, unspecified: Secondary | ICD-10-CM | POA: Diagnosis not present

## 2020-12-14 DIAGNOSIS — Z09 Encounter for follow-up examination after completed treatment for conditions other than malignant neoplasm: Secondary | ICD-10-CM | POA: Diagnosis not present

## 2020-12-17 DIAGNOSIS — R634 Abnormal weight loss: Secondary | ICD-10-CM | POA: Diagnosis not present

## 2020-12-17 DIAGNOSIS — C3401 Malignant neoplasm of right main bronchus: Secondary | ICD-10-CM | POA: Diagnosis not present

## 2020-12-17 DIAGNOSIS — J9859 Other diseases of mediastinum, not elsewhere classified: Secondary | ICD-10-CM | POA: Diagnosis not present

## 2020-12-17 DIAGNOSIS — Z5112 Encounter for antineoplastic immunotherapy: Secondary | ICD-10-CM | POA: Diagnosis not present

## 2020-12-18 ENCOUNTER — Ambulatory Visit (INDEPENDENT_AMBULATORY_CARE_PROVIDER_SITE_OTHER): Payer: Medicare Other | Admitting: *Deleted

## 2020-12-18 DIAGNOSIS — Z5181 Encounter for therapeutic drug level monitoring: Secondary | ICD-10-CM | POA: Diagnosis not present

## 2020-12-18 DIAGNOSIS — Z952 Presence of prosthetic heart valve: Secondary | ICD-10-CM

## 2020-12-18 LAB — POCT INR: INR: 5.8 — AB (ref 2.0–3.0)

## 2020-12-18 NOTE — Patient Instructions (Addendum)
Hold warfarin 3 days then decrease dose to 3mg  daily except 2mg  on Sundays and Wednesdays Pending full dental extractions.  Date TBD  Will hold warfarin 5 days and bridge with Lovenox Recheck in 1 week.  Denies any s/s of bleeding or excess bruising. Bleeding and fall precautions discussed with pt and she verbalized understanding.

## 2020-12-23 NOTE — Progress Notes (Signed)
Cardiology Office Note  Date: 12/24/2020   ID: Judy Sawyer, DOB 1956-01-31, MRN [id number]  PCP:  Denny Levy, PA  Cardiologist:  None Electrophysiologist:  None   Chief Complaint: Follow-up mechanical aortic valve replacement  History of Present Illness: Judy Sawyer is a 65 y.o. 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  History of ascending thoracic aortic aneurysm, COPD, esophageal strictures with multiple esophageal dilatations, chronic back pain.  She was last here 06/15/2020 for follow-up status post recent echocardiogram, stress test, and CT scan.  CT scan to reassess aortic aneurysm showed a 4.3 cm stable uncomplicated fusiform aneurysmal dilation of the ascending thoracic aorta grossly unchanged from previous scan in 2019.  Stress test was negative for ischemia and deemed a low risk study.  Echocardiogram showed EF of 60 to 65% some mild LVH and mild aortic regurgitation.  She denies any symptoms other than continuing chronic dyspnea.  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  Otherwise she denies any anginal symptoms, palpitations or arrhythmias, orthostatic symptoms, PND, orthopnea, claudication, DVT or PE-like symptoms, or lower extremity edema.  She presents for her 67-month follow-up today.  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  She sees Dr. Candie Echevaria oncology [facility] cancer center.  She had a laparoscopic colectomy with partial anastomosis on 08/03/2020 by Dr. Cecil Cobbs.  Operative findings were significant for mass involving most of the sigmoid colon with associated adenopathy.  1 mesenteric mass was adherent to the retroperitoneum.  Additional retroperitoneum resected and necessary to remove all gross disease.  Isolated omental metastasis also resected.  She  states she is taking immunotherapy for Colon CA sigmoid colon  States she has been doing well since surgery and has been continuing her immunotherapy with Dr. Candie Echevaria with nivolumab.  Her only complaint is she feels tired quite often due to the immune therapy.  She states she is to receive 12 treatments and will have an upcoming CT to reassess.  She denies any anginal symptoms, orthostatic symptoms, CVA or TIA-like symptoms, bleeding, PND, orthopnea, palpitations.  No claudication-like symptoms, DVT or PE-like symptoms.  Blood pressure is low normal at 100/60.  She is asymptomatic.  She continues to work without any issues. .  Past Medical History:  Diagnosis Date   Acute bronchitis    Anemia, unspecified    Aortic stenosis    previous cardiologisit: Dr. Remigio Eisenmenger in [place], [facility])    Carotid stenosis 05/13   mild less than 50% bilaterally   Chest pain    Collagen vascular disease ([place])    COPD (chronic obstructive pulmonary disease) ([place])    Fibromyalgia    GERD (gastroesophageal reflux disease)    Hypertension    Murmur    Tobacco abuse     Past Surgical History:  Procedure Laterality Date   ABDOMINAL HYSTERECTOMY     AORTIC VALVE REPLACEMENT  11/2011   in [place]. St. Jude 19 mm   COLONOSCOPY     KNEE ARTHROSCOPY WITH LATERAL MENISECTOMY Left 08/16/2014   Procedure: LEFT KNEE ARTHROSCOPY WITH LATERAL MENISECTOMY;  Surgeon: Lorn Junes, MD;  Location: [place];  Service: Orthopedics;  Laterality: Left;   KNEE ARTHROSCOPY WITH MEDIAL MENISECTOMY Left 08/16/2014   Procedure: KNEE ARTHROSCOPY WITH MEDIAL MENISECTOMY;  Surgeon: Lorn Junes, MD;  Location: [place];  Service: Orthopedics;  Laterality: Left;   TONSILLECTOMY     UPPER GI ENDOSCOPY      Current Outpatient Medications  Medication Sig Dispense Refill   amoxicillin (AMOXIL) 500 MG capsule Take 4 capsules by mouth 30-60 minutes prior to dental work. 4 capsule 3    aspirin EC 81 MG tablet Take 81 mg by mouth daily.     calcium carbonate (OSCAL) 1500 (600 Ca) MG TABS tablet Take 1 tablet by mouth daily with breakfast.     Cholecalciferol (VITAMIN D3) 1000 units CAPS Take 1 capsule by mouth daily.     EUTHYROX 125 MCG tablet Take 125 mcg by mouth every morning.     fluticasone (FLONASE) 50 MCG/ACT nasal spray Place into both nostrils as needed for allergies or rhinitis.     HYDROcodone-acetaminophen (NORCO) 7.5-325 MG tablet Take 1 tablet by mouth daily.     ipilimumab (YERVOY) 200 MG/40ML SOLN Inject into the vein.     levothyroxine (SYNTHROID) 25 MCG tablet Take 1 tablet by mouth daily at 6 (six) AM.     Melatonin 5 MG TABS Take 1 tablet by mouth daily.     metoprolol tartrate (LOPRESSOR) 25 MG tablet Take 0.5 tablets (12.5 mg total) by mouth 2 (two) times daily. 90 tablet 1   NIVOLUMAB IV Inject into the vein.     pantoprazole (PROTONIX) 40 MG tablet Take 40 mg by mouth 2 (two) times daily.      prochlorperazine (COMPAZINE) 10 MG tablet Take 10 mg by mouth every 6 (six) hours as needed for nausea or vomiting.     Sennosides 8.6 MG CAPS Take 1 capsule by mouth daily as needed.      warfarin (COUMADIN) 2 MG tablet Take 1 tablet on Sundays, Mondays, Wednesdays and Fridays or as directed.  (Will take 3mg  on T, Thur, Sat) 30 tablet 3   warfarin (COUMADIN) 3 MG tablet Take 1 tablet on Tuesdays, Thursdays and Saturdays or as directed  (Will take 2mg  all other days) 30 tablet 3   No current facility-administered medications for this visit.   Allergies:  Codeine   Social History: The patient  reports that she quit smoking about 3 years ago. Her smoking use included cigarettes. She started smoking about 51 years ago. She has a 21.00 pack-year smoking history. She has never used smokeless tobacco. She reports that she does not drink alcohol and does not use drugs.   Family History: The patient's family history includes Cancer in her mother; Diabetes type II in  her sister; Emphysema in her father; Heart attack in her mother; Heart disease in her sister; Liver disease in her father; Rheumatic fever in her sister.   ROS:  Please see the history of present illness. Otherwise, complete review of systems is positive for none.  All other systems are reviewed and negative.   Physical Exam: VS:  BP 100/60   Pulse 74   Ht 5\' 3"  (1.6 m)   Wt 107 lb 6.4 oz (48.7 kg)   SpO2 99%   BMI 19.03 kg/m , BMI Body mass index is 19.03 kg/m.  Wt Readings from Last 3 Encounters:  12/24/20 107 lb 6.4 oz (48.7 kg)  06/15/20 114 lb (51.7 kg)  04/24/20 113 lb (51.3 kg)    General: Thin appearing patient appears comfortable at rest. Neck: Supple, no elevated JVP or carotid bruits, no thyromegaly. Lungs: Mild expiratory wheeze heard bilateral posterior lung fields, nonlabored breathing at  rest. Cardiac: Regular rate and rhythm, no S3 or significant systolic murmur, no pericardial rub. Extremities: No pitting edema, distal pulses 2+. Skin: Warm and dry. Musculoskeletal: No kyphosis. Neuropsychiatric: Alert and oriented x3, affect grossly appropriate.  ECG:  An ECG dated 04/24/2020 was personally reviewed today and demonstrated:  EKG shows normal sinus rhythm rate of 77, right atrial enlargement, septal infarct, age undetermined.  Recent Labwork: 05/18/2020: Creatinine, Ser 1.20  No results found for: CHOL, TRIG, HDL, CHOLHDL, VLDL, LDLCALC, LDLDIRECT  Other Studies Reviewed Today:  CTA chest 05/19/2020 IMPRESSION: 1. Worsening masslike thickening about the right hilum, potentially the sequela of evolving radiation change though progressive disease is not excluded on the basis of this examination. Further evaluation with PET-CT could be performed as indicated. 2. Stable uncomplicated fusiform aneurysmal dilatation of the ascending thoracic aorta measuring 43 mm in maximal diameter, grossly unchanged compared to the 04/2018 examination. Recommend annual imaging  followup by CTA or MRA. This recommendation follows 2010 ACCF/AHA/AATS/ACR/ASA/SCA/SCAI/SIR/STS/SVM Guidelines for the Diagnosis and Management of Patients with Thoracic Aortic Disease. Circulation. 2010; 121: U235-T614. Aortic aneurysm NOS (ICD10-I71.9) 3. Stable sequela of previous open aortic valve repair. 4. Coronary calcifications.  Aortic Atherosclerosis (ICD10-I70.0). 5.  Emphysema (ICD10-J43.9).   NST 05/04/2020 Study Result  Narrative & Impression  There was no ST segment deviation noted during stress. The study is normal. There are no perfusion defects This is a low risk study. The left ventricular ejection fraction is hyperdynamic (>65%).     Echocardiogram 05/04/2020  Left ventricular ejection fraction, by estimation, is 60 to 65%. The left ventricle has normal function. The left ventricle has no regional wall motion abnormalities. There is mild left ventricular hypertrophy. Left ventricular diastolic parameters are indeterminate. 2. Right ventricular systolic function is normal. The right ventricular size is normal. 3. The mitral valve is normal in structure. No evidence of mitral valve regurgitation. No evidence of mitral stenosis. 4. According to medical record St Jude mechanical valve 33mm is in the AV position.. The aortic valve has been repaired/replaced. Aortic valve regurgitation is mild. No aortic stenosis is present. 5. The inferior vena cava is normal in size with greater than 50% respiratory variability, suggesting right atrial pressure of 3 mmHg.    Lower venous Doppler study of left leg 02/01/2020 [facility] Impressions Performed At  1. No evidence of DVT within the left lower extremity.   2. Note made of an approximately 2.6 cm hypoechoic lobulated fluid   collection with the subcutaneous tissues subjacent to the area of   bruising involving the aspect the left calf, nonspecific though   given overlying bruising may represent an evolving  hematoma.   Clinical correlation is advised             Oncology History Overview Note  1/19: PET/CT:  Impression  - Demonstration of a large hypermetabolic soft tissue mass in the right aspect of the mediastinum from the level of the aortic arch extending down to encompass the right mainstem bronchus as described above. There is also a separate hypermetabolic right paratracheal lymph node just superior to the mass as described above.  - Hypermetabolic left level 2A lymph node as described above in which metastatic disease cannot be excluded.  - Mildly hypermetabolic subpleural pulmonary opacity in the inferior lateral aspect of the right middle lobe as described above. This may be inflammation from scarring, recommend attention on follow-up imaging.      02/11/2019 at [facility] CT showed ascending aorta measures 4.5  cm  Recommend semiannual imaging follow-up CTA or MRI and referral to cardiothoracic surgery if not already obtained.  Echocardiogram 04/09/2017 Study Conclusions   - Left ventricle: The cavity size was normal. Wall thickness was    increased in a pattern of mild LVH. Systolic function was normal.    The estimated ejection fraction was in the range of 60% to 65%.    Wall motion was normal; there were no regional wall motion    abnormalities. Doppler parameters are consistent with abnormal    left ventricular relaxation (grade 1 diastolic dysfunction).  - Aortic valve: A 19 mm St. Jude mechanical prosthesis is in the    aortic position. There was trivial regurgitation. Mean gradient    (S): 11 mm Hg. Peak gradient (S): 21 mm Hg. VTI ratio of LVOT to    aortic valve: 0.64.  - Mitral valve: Mildly calcified annulus. Mildly thickened leaflets    . There was trivial regurgitation.  - Right atrium: Central venous pressure (est): 3 mm Hg.  - Atrial septum: The interatrial septum was hypermobile. No defect    or patent foramen ovale was identified.  - Tricuspid  valve: There was trivial regurgitation.  - Pulmonary arteries: PA peak pressure: 34 mm Hg (S).  - Pericardium, extracardiac: There was no pericardial effusion.   Impressions:   - Mild LVH with LVEF 60-65% and grade 1 diastolic dysfunction.    Mildly calcified mitral annulus and thickened leaflets with    trivial mitral regurgitation. St. Jude mechanical prosthesis in    aortic position with trivial aortic regurgitation and gradients    suggesting grossly normal function. Trivial tricuspid    regurgitation with estimated PASP 34 mmHg.   Assessment and Plan:   .1. Thoracic ascending aortic aneurysm [facility]) History thoracic ascending aortic aneurysm.Chest CT 02/11/2019 showed ascending aorta measures 4.5 cm.  Recommended semiannual imaging follow-up CTA or MRI and referral to cardiothoracic surgery if not already obtained.  Repeat chest CT scan to reassess aortic aneurysm showed a 4.3 cm stable uncomplicated fusiform aneurysmal dilation of the ascending thoracic aorta grossly unchanged from previous scan in 2019.  She denies any issues today.  We discussed follow-up CT angio in November for surveillance of aortic aneurysm.  2. H/O mechanical aortic valve replacement Last echocardiogram 03/20/2017 demonstrated mild LVH, EF 60 to 65%.  G1 DD, Saint Jude mechanical prosthesis in the aortic position trivial regurgitation.  Trivial mitral regurgitation, trivial tricuspid regurgitation.  Continue Coumadin as directed.  Continue aspirin 81 mg daily  3. Essential hypertension Blood pressure on the low normal side at 100/60,.  Heart rate 74.  Continue metoprolol to 12.5 mg p.o. twice daily.      Medication Adjustments/Labs and Tests Ordered: Current medicines are reviewed at length with the patient today.  Concerns regarding medicines are outlined above.   Disposition: Follow-up with Dr. Harl Bowie or APP 6 months  Signed, Levell July, NP 12/24/2020 3:46 PM    No Name at  [place], [place], [place] [address] Phone: [phone]; Fax: [phone]

## 2020-12-24 ENCOUNTER — Ambulatory Visit (INDEPENDENT_AMBULATORY_CARE_PROVIDER_SITE_OTHER): Payer: Medicare Other | Admitting: *Deleted

## 2020-12-24 ENCOUNTER — Ambulatory Visit (INDEPENDENT_AMBULATORY_CARE_PROVIDER_SITE_OTHER): Payer: Medicare Other | Admitting: Family Medicine

## 2020-12-24 ENCOUNTER — Encounter: Payer: Self-pay | Admitting: Family Medicine

## 2020-12-24 VITALS — BP 100/60 | HR 74 | Ht 63.0 in | Wt 107.4 lb

## 2020-12-24 DIAGNOSIS — Z952 Presence of prosthetic heart valve: Secondary | ICD-10-CM

## 2020-12-24 DIAGNOSIS — I10 Essential (primary) hypertension: Secondary | ICD-10-CM | POA: Diagnosis not present

## 2020-12-24 DIAGNOSIS — Z5181 Encounter for therapeutic drug level monitoring: Secondary | ICD-10-CM

## 2020-12-24 DIAGNOSIS — I712 Thoracic aortic aneurysm, without rupture: Secondary | ICD-10-CM | POA: Diagnosis not present

## 2020-12-24 DIAGNOSIS — I7121 Aneurysm of the ascending aorta, without rupture: Secondary | ICD-10-CM

## 2020-12-24 LAB — POCT INR: INR: 2.4 (ref 2.0–3.0)

## 2020-12-24 NOTE — Patient Instructions (Signed)
Continue warfarin 3mg  daily except 2mg  on Sundays and Wednesdays Pending full dental extractions.  Date TBD  Will hold warfarin 5 days and bridge with Lovenox Recheck in 1 week

## 2020-12-24 NOTE — Patient Instructions (Addendum)
Medication Instructions:  Your physician recommends that you continue on your current medications as directed. Please refer to the Current Medication list given to you today.  Labwork: BMET within 6 weeks of your ct scan in December  Testing/Procedures: CT of chest and aorta in December 2022  Follow-Up: Your physician recommends that you schedule a follow-up appointment in: 6 months  Any Other Special Instructions Will Be Listed Below (If Applicable).  If you need a refill on your cardiac medications before your next appointment, please call your pharmacy.

## 2020-12-26 ENCOUNTER — Telehealth: Payer: Self-pay | Admitting: Family Medicine

## 2020-12-26 NOTE — Telephone Encounter (Signed)
Christy at [facility] called would like instructions for the patient's Levenox. She can be reached at [phone].

## 2020-12-26 NOTE — Telephone Encounter (Signed)
Called Christy back.  NA.  Left voice message that pt will need to hold warfarin before GI procedure on 02/26/21 at [facility] and be bridged with Lovenox. This will be addressed closer to time of procedure.  Christy to call back if further questions.

## 2020-12-27 ENCOUNTER — Telehealth: Payer: Self-pay | Admitting: *Deleted

## 2020-12-27 NOTE — Telephone Encounter (Signed)
   [facility] Pre-operative Risk Assessment    Patient Name: Judy Sawyer  DOB: 1956-06-24  MRN: [id number]   HEARTCARE STAFF: - Please ensure there is not already an duplicate clearance open for this procedure. - Under Visit Info/Reason for Call, type in Other and utilize the format Clearance MM/DD/YY or Clearance TBD. Do not use dashes or single digits. - If request is for dental extraction, please clarify the # of teeth to be extracted. - If the patient is currently at the dentist's office, call Pre-Op APP to address. If the patient is not currently in the dentist office, please route to the Pre-Op pool  Request for surgical clearance:  What type of surgery is being performed? UGI, Endoscopy   When is this surgery scheduled? 02/26/21   What type of clearance is required (medical clearance vs. Pharmacy clearance to hold med vs. Both)? pharmacy  Are there any medications that need to be held prior to surgery and how long? Hold warfarin x 5 days and bridge with Lovenox  Practice name and name of physician performing surgery? [facility] GI Department   What is the office phone number? [phone]   7.   What is the office fax number? [phone]  8.   Anesthesia type (None, local, MAC, general) ? MAC   Edrick Oh 12/27/2020, 1:13 PM  _________________________________________________________________   (provider comments below)

## 2020-12-27 NOTE — Telephone Encounter (Signed)
Patient with diagnosis of mechanical aortic valve and active cancer on warfarin for anticoagulation.    Procedure: UGI, Endoscopy  Date of procedure: 02/26/21  CrCl 39 ml/min Platelet count 488  Coumadin previous managed by heme/onc at [facility]. Patient bridged for all prior procedures.   Per office protocol, patient can hold warfarin for 5 days prior to procedure.   Patient WILL need bridging with Lovenox (enoxaparin) around procedure.  Bridge will be coordinate by Edrick Oh in [place] office

## 2020-12-27 NOTE — Telephone Encounter (Signed)
    Judy Sawyer DOB:  19-Mar-1956  MRN:  [id number]   Primary Cardiologist: None  Chart reviewed as part of pre-operative protocol coverage. Given past medical history and time since last visit, based on ACC/AHA guidelines, Judy Sawyer would be at acceptable risk for the planned procedure without further cardiovascular testing.   Patient with diagnosis of mechanical aortic valve and active cancer on warfarin for anticoagulation.     Procedure: UGI, Endoscopy  Date of procedure: 02/26/21   CrCl 39 ml/min Platelet count 488   Coumadin previous managed by heme/onc at [facility]. Patient bridged for all prior procedures.    Per office protocol, patient can hold warfarin for 5 days prior to procedure. Patient WILL need bridging with Lovenox (enoxaparin) around procedure.   Bridge will be coordinate by Edrick Oh in [place] office  I will route this recommendation to the requesting party via Epic fax function and remove from pre-op pool.  Please call with questions.  Kathyrn Drown, NP 12/27/2020, 1:55 PM

## 2020-12-28 DIAGNOSIS — E039 Hypothyroidism, unspecified: Secondary | ICD-10-CM | POA: Diagnosis not present

## 2020-12-28 DIAGNOSIS — Z09 Encounter for follow-up examination after completed treatment for conditions other than malignant neoplasm: Secondary | ICD-10-CM | POA: Diagnosis not present

## 2020-12-28 DIAGNOSIS — J9859 Other diseases of mediastinum, not elsewhere classified: Secondary | ICD-10-CM | POA: Diagnosis not present

## 2020-12-28 DIAGNOSIS — C3401 Malignant neoplasm of right main bronchus: Secondary | ICD-10-CM | POA: Diagnosis not present

## 2020-12-31 DIAGNOSIS — C3491 Malignant neoplasm of unspecified part of right bronchus or lung: Secondary | ICD-10-CM | POA: Diagnosis not present

## 2020-12-31 DIAGNOSIS — J9859 Other diseases of mediastinum, not elsewhere classified: Secondary | ICD-10-CM | POA: Diagnosis not present

## 2020-12-31 DIAGNOSIS — R918 Other nonspecific abnormal finding of lung field: Secondary | ICD-10-CM | POA: Diagnosis not present

## 2020-12-31 DIAGNOSIS — Z5112 Encounter for antineoplastic immunotherapy: Secondary | ICD-10-CM | POA: Diagnosis not present

## 2020-12-31 DIAGNOSIS — J432 Centrilobular emphysema: Secondary | ICD-10-CM | POA: Diagnosis not present

## 2020-12-31 DIAGNOSIS — Z9049 Acquired absence of other specified parts of digestive tract: Secondary | ICD-10-CM | POA: Diagnosis not present

## 2020-12-31 DIAGNOSIS — R590 Localized enlarged lymph nodes: Secondary | ICD-10-CM | POA: Diagnosis not present

## 2020-12-31 DIAGNOSIS — J90 Pleural effusion, not elsewhere classified: Secondary | ICD-10-CM | POA: Diagnosis not present

## 2020-12-31 DIAGNOSIS — J8410 Pulmonary fibrosis, unspecified: Secondary | ICD-10-CM | POA: Diagnosis not present

## 2020-12-31 DIAGNOSIS — I700 Atherosclerosis of aorta: Secondary | ICD-10-CM | POA: Diagnosis not present

## 2020-12-31 DIAGNOSIS — R634 Abnormal weight loss: Secondary | ICD-10-CM | POA: Diagnosis not present

## 2020-12-31 DIAGNOSIS — J439 Emphysema, unspecified: Secondary | ICD-10-CM | POA: Diagnosis not present

## 2020-12-31 DIAGNOSIS — C7880 Secondary malignant neoplasm of unspecified digestive organ: Secondary | ICD-10-CM | POA: Diagnosis not present

## 2020-12-31 DIAGNOSIS — C3401 Malignant neoplasm of right main bronchus: Secondary | ICD-10-CM | POA: Diagnosis not present

## 2021-01-03 ENCOUNTER — Other Ambulatory Visit: Payer: Self-pay

## 2021-01-03 ENCOUNTER — Ambulatory Visit (INDEPENDENT_AMBULATORY_CARE_PROVIDER_SITE_OTHER): Payer: Medicare Other | Admitting: *Deleted

## 2021-01-03 DIAGNOSIS — Z952 Presence of prosthetic heart valve: Secondary | ICD-10-CM

## 2021-01-03 DIAGNOSIS — Z5181 Encounter for therapeutic drug level monitoring: Secondary | ICD-10-CM

## 2021-01-03 LAB — POCT INR: INR: 5.3 — AB (ref 2.0–3.0)

## 2021-01-03 NOTE — Patient Instructions (Signed)
Full dental extractions on 01/17/21  Labs:  12/28/20:  Hgb 10.6  Hct 36.4  Plts 389  SCr 0.95  CrCl 44.18  Wt. 48.7kg  01/11/21  Take last dose of warfarin 7/2  No Lovenox or warfarin 7/3 - 7/5  Lovenox 40mg  sq AM & PM 7/6  Lovenox 40mg  in am.------No lovenox in pm 7/7  No Lovenox in am -----------procedure ------------warfarin 3mg  pm 7/8 - 7/11  Lovenox 40mg  am & pm and warfarin 3mg  pm 7/12  Lovenox 40mg  am & pm and warfarin 2mg  pm 7/13  Lovenox 40mg  am -------------INR appt @ 3:15pm

## 2021-01-08 DIAGNOSIS — F17210 Nicotine dependence, cigarettes, uncomplicated: Secondary | ICD-10-CM | POA: Diagnosis not present

## 2021-01-08 DIAGNOSIS — E538 Deficiency of other specified B group vitamins: Secondary | ICD-10-CM | POA: Diagnosis not present

## 2021-01-08 DIAGNOSIS — Z681 Body mass index (BMI) 19 or less, adult: Secondary | ICD-10-CM | POA: Diagnosis not present

## 2021-01-08 DIAGNOSIS — D649 Anemia, unspecified: Secondary | ICD-10-CM | POA: Diagnosis not present

## 2021-01-08 DIAGNOSIS — C785 Secondary malignant neoplasm of large intestine and rectum: Secondary | ICD-10-CM | POA: Diagnosis not present

## 2021-01-08 DIAGNOSIS — E039 Hypothyroidism, unspecified: Secondary | ICD-10-CM | POA: Diagnosis not present

## 2021-01-08 DIAGNOSIS — I2510 Atherosclerotic heart disease of native coronary artery without angina pectoris: Secondary | ICD-10-CM | POA: Diagnosis not present

## 2021-01-10 DIAGNOSIS — C3401 Malignant neoplasm of right main bronchus: Secondary | ICD-10-CM | POA: Diagnosis not present

## 2021-01-10 DIAGNOSIS — E039 Hypothyroidism, unspecified: Secondary | ICD-10-CM | POA: Diagnosis not present

## 2021-01-10 DIAGNOSIS — J9859 Other diseases of mediastinum, not elsewhere classified: Secondary | ICD-10-CM | POA: Diagnosis not present

## 2021-01-10 DIAGNOSIS — Z09 Encounter for follow-up examination after completed treatment for conditions other than malignant neoplasm: Secondary | ICD-10-CM | POA: Diagnosis not present

## 2021-01-15 DIAGNOSIS — Z5112 Encounter for antineoplastic immunotherapy: Secondary | ICD-10-CM | POA: Diagnosis not present

## 2021-01-15 DIAGNOSIS — R634 Abnormal weight loss: Secondary | ICD-10-CM | POA: Diagnosis not present

## 2021-01-15 DIAGNOSIS — C3401 Malignant neoplasm of right main bronchus: Secondary | ICD-10-CM | POA: Diagnosis not present

## 2021-01-15 DIAGNOSIS — J9859 Other diseases of mediastinum, not elsewhere classified: Secondary | ICD-10-CM | POA: Diagnosis not present

## 2021-01-21 ENCOUNTER — Telehealth: Payer: Self-pay | Admitting: Family Medicine

## 2021-01-21 NOTE — Telephone Encounter (Signed)
Spoke with Judy Sawyer.  She had full dental extractions on 01/17/21.  Warfarin was held 5 days before procedure and she was bridged with Lovenox.  She resumed warfarin night of procedure.  She had been doing fine until today when she started having more bleeding from top and bottom gums.  Judy Sawyer has been applying warm tea bags.  Told patient to hold warfarin tonight and try some ice chips.  She will continue lovenox as ordered.  She will call me back tomorrow to let me know how she is doing.  She will call dentist office or report to ED if bleeding worsens.

## 2021-01-21 NOTE — Telephone Encounter (Signed)
New message    Patient would like call back she states it is urgent she is having bleeding issues with her teeth

## 2021-01-23 ENCOUNTER — Ambulatory Visit (INDEPENDENT_AMBULATORY_CARE_PROVIDER_SITE_OTHER): Payer: Medicare Other | Admitting: *Deleted

## 2021-01-23 DIAGNOSIS — Z5181 Encounter for therapeutic drug level monitoring: Secondary | ICD-10-CM

## 2021-01-23 DIAGNOSIS — Z952 Presence of prosthetic heart valve: Secondary | ICD-10-CM | POA: Diagnosis not present

## 2021-01-23 LAB — POCT INR: INR: 1.7 — AB (ref 2.0–3.0)

## 2021-01-23 NOTE — Patient Instructions (Signed)
Trouble with gums bleeding post full dental extractions top and bottom.  Saw dentist today.  Cleaned and packed with tea bags.   Take warfarin 3 tablets tonight then resume 3mg  daily except 2mg  on Sundays and Wednesdays Stop lovenox injections due to gums bleeding (INR 1.7 and boosted today) Recheck on 01/28/21

## 2021-01-25 ENCOUNTER — Other Ambulatory Visit: Payer: Self-pay | Admitting: *Deleted

## 2021-01-25 DIAGNOSIS — D8989 Other specified disorders involving the immune mechanism, not elsewhere classified: Secondary | ICD-10-CM | POA: Diagnosis not present

## 2021-01-25 DIAGNOSIS — C3401 Malignant neoplasm of right main bronchus: Secondary | ICD-10-CM | POA: Diagnosis not present

## 2021-01-25 DIAGNOSIS — Z09 Encounter for follow-up examination after completed treatment for conditions other than malignant neoplasm: Secondary | ICD-10-CM | POA: Diagnosis not present

## 2021-01-25 DIAGNOSIS — Z681 Body mass index (BMI) 19 or less, adult: Secondary | ICD-10-CM | POA: Diagnosis not present

## 2021-01-25 DIAGNOSIS — J9859 Other diseases of mediastinum, not elsewhere classified: Secondary | ICD-10-CM | POA: Diagnosis not present

## 2021-01-25 DIAGNOSIS — E039 Hypothyroidism, unspecified: Secondary | ICD-10-CM | POA: Diagnosis not present

## 2021-01-25 DIAGNOSIS — E032 Hypothyroidism due to medicaments and other exogenous substances: Secondary | ICD-10-CM | POA: Diagnosis not present

## 2021-01-25 DIAGNOSIS — D500 Iron deficiency anemia secondary to blood loss (chronic): Secondary | ICD-10-CM | POA: Diagnosis not present

## 2021-01-25 DIAGNOSIS — R978 Other abnormal tumor markers: Secondary | ICD-10-CM | POA: Diagnosis not present

## 2021-01-25 MED ORDER — METOPROLOL TARTRATE 25 MG PO TABS: 12.5000 mg | ORAL_TABLET | Freq: Two times a day (BID) | ORAL | 1 refills | Status: DC

## 2021-01-28 ENCOUNTER — Other Ambulatory Visit: Payer: Self-pay

## 2021-01-28 ENCOUNTER — Ambulatory Visit (INDEPENDENT_AMBULATORY_CARE_PROVIDER_SITE_OTHER): Payer: Medicare Other | Admitting: *Deleted

## 2021-01-28 DIAGNOSIS — Z5181 Encounter for therapeutic drug level monitoring: Secondary | ICD-10-CM | POA: Diagnosis not present

## 2021-01-28 DIAGNOSIS — C3401 Malignant neoplasm of right main bronchus: Secondary | ICD-10-CM | POA: Diagnosis not present

## 2021-01-28 DIAGNOSIS — R634 Abnormal weight loss: Secondary | ICD-10-CM | POA: Diagnosis not present

## 2021-01-28 DIAGNOSIS — Z952 Presence of prosthetic heart valve: Secondary | ICD-10-CM | POA: Diagnosis not present

## 2021-01-28 DIAGNOSIS — Z5112 Encounter for antineoplastic immunotherapy: Secondary | ICD-10-CM | POA: Diagnosis not present

## 2021-01-28 DIAGNOSIS — J9859 Other diseases of mediastinum, not elsewhere classified: Secondary | ICD-10-CM | POA: Diagnosis not present

## 2021-01-28 LAB — POCT INR: INR: 5.2 — AB (ref 2.0–3.0)

## 2021-01-28 NOTE — Patient Instructions (Signed)
Had immune therapy today Hold warfarin tonight and tomorrow night.  Take (1mg ) 1/2 tablet on Wednesday then resume 3mg  daily except 2mg  on Sundays and Wednesdays Recheck on 8/2//22

## 2021-02-11 DIAGNOSIS — Z09 Encounter for follow-up examination after completed treatment for conditions other than malignant neoplasm: Secondary | ICD-10-CM | POA: Diagnosis not present

## 2021-02-11 DIAGNOSIS — E039 Hypothyroidism, unspecified: Secondary | ICD-10-CM | POA: Diagnosis not present

## 2021-02-11 DIAGNOSIS — E032 Hypothyroidism due to medicaments and other exogenous substances: Secondary | ICD-10-CM | POA: Diagnosis not present

## 2021-02-11 DIAGNOSIS — Z681 Body mass index (BMI) 19 or less, adult: Secondary | ICD-10-CM | POA: Diagnosis not present

## 2021-02-11 DIAGNOSIS — D500 Iron deficiency anemia secondary to blood loss (chronic): Secondary | ICD-10-CM | POA: Diagnosis not present

## 2021-02-11 DIAGNOSIS — C3401 Malignant neoplasm of right main bronchus: Secondary | ICD-10-CM | POA: Diagnosis not present

## 2021-02-12 ENCOUNTER — Ambulatory Visit (INDEPENDENT_AMBULATORY_CARE_PROVIDER_SITE_OTHER): Payer: Medicare Other | Admitting: *Deleted

## 2021-02-12 DIAGNOSIS — Z5181 Encounter for therapeutic drug level monitoring: Secondary | ICD-10-CM | POA: Diagnosis not present

## 2021-02-12 DIAGNOSIS — J9859 Other diseases of mediastinum, not elsewhere classified: Secondary | ICD-10-CM | POA: Diagnosis not present

## 2021-02-12 DIAGNOSIS — Z5112 Encounter for antineoplastic immunotherapy: Secondary | ICD-10-CM | POA: Diagnosis not present

## 2021-02-12 DIAGNOSIS — C3401 Malignant neoplasm of right main bronchus: Secondary | ICD-10-CM | POA: Diagnosis not present

## 2021-02-12 DIAGNOSIS — Z952 Presence of prosthetic heart valve: Secondary | ICD-10-CM | POA: Diagnosis not present

## 2021-02-12 DIAGNOSIS — R634 Abnormal weight loss: Secondary | ICD-10-CM | POA: Diagnosis not present

## 2021-02-12 LAB — POCT INR: INR: 3.3 — AB (ref 2.0–3.0)

## 2021-02-14 NOTE — Patient Instructions (Addendum)
Procedure 02/26/21:  Endoscopy  Age 65  Wt 45.8kg   Labs 02/11/21:  Hgb 9.9   Hct 34.0   Plts 496   SCr 1.08   CrCl 36.55  8/10  Last dose of warfarin 8/11  No lovenox or warfarin  8/12 - 8/14  Lovenox 40mg  @ 7am & 7pm 8/15  Lovenox 40mg  @ 7am---------No lovenox in pm 8/16  No lovenox in am -------procedure--------Warfarin 3mg  pm 8/17 - 8/20  Lovenox 40mg  @ 7am & 7pm and warfarin 3mg  pm 8/21  Lovenox 40mg  @ 7am & 7pm and warfarin 2mg  in pm 7/22  Lovenox 40mg  @ 7am---------INR appt  @ 3:45pm

## 2021-02-22 DIAGNOSIS — Z09 Encounter for follow-up examination after completed treatment for conditions other than malignant neoplasm: Secondary | ICD-10-CM | POA: Diagnosis not present

## 2021-02-22 DIAGNOSIS — Z681 Body mass index (BMI) 19 or less, adult: Secondary | ICD-10-CM | POA: Diagnosis not present

## 2021-02-22 DIAGNOSIS — C3401 Malignant neoplasm of right main bronchus: Secondary | ICD-10-CM | POA: Diagnosis not present

## 2021-02-22 DIAGNOSIS — E039 Hypothyroidism, unspecified: Secondary | ICD-10-CM | POA: Diagnosis not present

## 2021-02-22 DIAGNOSIS — J9859 Other diseases of mediastinum, not elsewhere classified: Secondary | ICD-10-CM | POA: Diagnosis not present

## 2021-02-25 DIAGNOSIS — Z5112 Encounter for antineoplastic immunotherapy: Secondary | ICD-10-CM | POA: Diagnosis not present

## 2021-02-25 DIAGNOSIS — C3401 Malignant neoplasm of right main bronchus: Secondary | ICD-10-CM | POA: Diagnosis not present

## 2021-02-26 DIAGNOSIS — M810 Age-related osteoporosis without current pathological fracture: Secondary | ICD-10-CM | POA: Diagnosis not present

## 2021-02-26 DIAGNOSIS — I252 Old myocardial infarction: Secondary | ICD-10-CM | POA: Diagnosis not present

## 2021-02-26 DIAGNOSIS — K219 Gastro-esophageal reflux disease without esophagitis: Secondary | ICD-10-CM | POA: Diagnosis not present

## 2021-02-26 DIAGNOSIS — K222 Esophageal obstruction: Secondary | ICD-10-CM | POA: Diagnosis not present

## 2021-02-26 DIAGNOSIS — C3490 Malignant neoplasm of unspecified part of unspecified bronchus or lung: Secondary | ICD-10-CM | POA: Diagnosis not present

## 2021-02-26 DIAGNOSIS — I2510 Atherosclerotic heart disease of native coronary artery without angina pectoris: Secondary | ICD-10-CM | POA: Diagnosis not present

## 2021-02-26 DIAGNOSIS — R1310 Dysphagia, unspecified: Secondary | ICD-10-CM | POA: Diagnosis not present

## 2021-02-26 DIAGNOSIS — J439 Emphysema, unspecified: Secondary | ICD-10-CM | POA: Diagnosis not present

## 2021-02-26 DIAGNOSIS — E041 Nontoxic single thyroid nodule: Secondary | ICD-10-CM | POA: Diagnosis not present

## 2021-02-26 DIAGNOSIS — I10 Essential (primary) hypertension: Secondary | ICD-10-CM | POA: Diagnosis not present

## 2021-02-26 DIAGNOSIS — Z886 Allergy status to analgesic agent status: Secondary | ICD-10-CM | POA: Diagnosis not present

## 2021-02-26 DIAGNOSIS — I471 Supraventricular tachycardia: Secondary | ICD-10-CM | POA: Diagnosis not present

## 2021-02-26 DIAGNOSIS — E039 Hypothyroidism, unspecified: Secondary | ICD-10-CM | POA: Diagnosis not present

## 2021-02-26 DIAGNOSIS — D649 Anemia, unspecified: Secondary | ICD-10-CM | POA: Diagnosis not present

## 2021-02-26 DIAGNOSIS — K529 Noninfective gastroenteritis and colitis, unspecified: Secondary | ICD-10-CM | POA: Diagnosis not present

## 2021-02-26 DIAGNOSIS — Z79899 Other long term (current) drug therapy: Secondary | ICD-10-CM | POA: Diagnosis not present

## 2021-02-26 DIAGNOSIS — Q251 Coarctation of aorta: Secondary | ICD-10-CM | POA: Diagnosis not present

## 2021-02-26 DIAGNOSIS — K449 Diaphragmatic hernia without obstruction or gangrene: Secondary | ICD-10-CM | POA: Diagnosis not present

## 2021-03-05 ENCOUNTER — Ambulatory Visit (INDEPENDENT_AMBULATORY_CARE_PROVIDER_SITE_OTHER): Payer: Medicare Other | Admitting: *Deleted

## 2021-03-05 DIAGNOSIS — Z5181 Encounter for therapeutic drug level monitoring: Secondary | ICD-10-CM | POA: Diagnosis not present

## 2021-03-05 DIAGNOSIS — Z952 Presence of prosthetic heart valve: Secondary | ICD-10-CM | POA: Diagnosis not present

## 2021-03-05 LAB — POCT INR: INR: 7 — AB (ref 2.0–3.0)

## 2021-03-05 NOTE — Patient Instructions (Signed)
Hold warfarin x 4 days (Tues - Friday) then decrease dose to 2mg  daily except 3mg  on Tuesdays, Thursdays and Saturdays Stop Lovenox Recheck on 03/11/21 Denies any signs of bleeding.  Bleeding and fall precautions discussed with pt and she verbalized understanding.  Pt knows to go to [facility] ED for bleeding or fall.

## 2021-03-07 DIAGNOSIS — G35 Multiple sclerosis: Secondary | ICD-10-CM | POA: Diagnosis not present

## 2021-03-07 DIAGNOSIS — C3401 Malignant neoplasm of right main bronchus: Secondary | ICD-10-CM | POA: Diagnosis not present

## 2021-03-07 DIAGNOSIS — J439 Emphysema, unspecified: Secondary | ICD-10-CM | POA: Diagnosis not present

## 2021-03-07 DIAGNOSIS — N3289 Other specified disorders of bladder: Secondary | ICD-10-CM | POA: Diagnosis not present

## 2021-03-07 DIAGNOSIS — Z5111 Encounter for antineoplastic chemotherapy: Secondary | ICD-10-CM | POA: Diagnosis not present

## 2021-03-07 DIAGNOSIS — C3491 Malignant neoplasm of unspecified part of right bronchus or lung: Secondary | ICD-10-CM | POA: Diagnosis not present

## 2021-03-07 DIAGNOSIS — J9859 Other diseases of mediastinum, not elsewhere classified: Secondary | ICD-10-CM | POA: Diagnosis not present

## 2021-03-07 DIAGNOSIS — K8689 Other specified diseases of pancreas: Secondary | ICD-10-CM | POA: Diagnosis not present

## 2021-03-08 DIAGNOSIS — J9859 Other diseases of mediastinum, not elsewhere classified: Secondary | ICD-10-CM | POA: Diagnosis not present

## 2021-03-08 DIAGNOSIS — K208 Other esophagitis without bleeding: Secondary | ICD-10-CM | POA: Diagnosis not present

## 2021-03-08 DIAGNOSIS — D500 Iron deficiency anemia secondary to blood loss (chronic): Secondary | ICD-10-CM | POA: Diagnosis not present

## 2021-03-08 DIAGNOSIS — C7880 Secondary malignant neoplasm of unspecified digestive organ: Secondary | ICD-10-CM | POA: Diagnosis not present

## 2021-03-08 DIAGNOSIS — T66XXXA Radiation sickness, unspecified, initial encounter: Secondary | ICD-10-CM | POA: Diagnosis not present

## 2021-03-08 DIAGNOSIS — K029 Dental caries, unspecified: Secondary | ICD-10-CM | POA: Diagnosis not present

## 2021-03-08 DIAGNOSIS — E039 Hypothyroidism, unspecified: Secondary | ICD-10-CM | POA: Diagnosis not present

## 2021-03-08 DIAGNOSIS — K222 Esophageal obstruction: Secondary | ICD-10-CM | POA: Diagnosis not present

## 2021-03-08 DIAGNOSIS — C3401 Malignant neoplasm of right main bronchus: Secondary | ICD-10-CM | POA: Diagnosis not present

## 2021-03-08 DIAGNOSIS — S22000A Wedge compression fracture of unspecified thoracic vertebra, initial encounter for closed fracture: Secondary | ICD-10-CM | POA: Diagnosis not present

## 2021-03-08 DIAGNOSIS — Z09 Encounter for follow-up examination after completed treatment for conditions other than malignant neoplasm: Secondary | ICD-10-CM | POA: Diagnosis not present

## 2021-03-08 DIAGNOSIS — Z7901 Long term (current) use of anticoagulants: Secondary | ICD-10-CM | POA: Diagnosis not present

## 2021-03-08 DIAGNOSIS — Z952 Presence of prosthetic heart valve: Secondary | ICD-10-CM | POA: Diagnosis not present

## 2021-03-11 ENCOUNTER — Other Ambulatory Visit: Payer: Self-pay

## 2021-03-11 ENCOUNTER — Ambulatory Visit (INDEPENDENT_AMBULATORY_CARE_PROVIDER_SITE_OTHER): Payer: Medicare Other | Admitting: *Deleted

## 2021-03-11 DIAGNOSIS — Z5181 Encounter for therapeutic drug level monitoring: Secondary | ICD-10-CM

## 2021-03-11 DIAGNOSIS — C3401 Malignant neoplasm of right main bronchus: Secondary | ICD-10-CM | POA: Diagnosis not present

## 2021-03-11 DIAGNOSIS — Z952 Presence of prosthetic heart valve: Secondary | ICD-10-CM

## 2021-03-11 DIAGNOSIS — R634 Abnormal weight loss: Secondary | ICD-10-CM | POA: Diagnosis not present

## 2021-03-11 DIAGNOSIS — J9859 Other diseases of mediastinum, not elsewhere classified: Secondary | ICD-10-CM | POA: Diagnosis not present

## 2021-03-11 DIAGNOSIS — Z5112 Encounter for antineoplastic immunotherapy: Secondary | ICD-10-CM | POA: Diagnosis not present

## 2021-03-11 LAB — POCT INR: INR: 2 (ref 2.0–3.0)

## 2021-03-11 NOTE — Patient Instructions (Signed)
Continue warfarin 2mg  daily except 3mg  on Tuesdays, Thursdays and Saturdays Recheck on 03/22/21

## 2021-03-18 DIAGNOSIS — Z20822 Contact with and (suspected) exposure to covid-19: Secondary | ICD-10-CM | POA: Diagnosis not present

## 2021-03-22 ENCOUNTER — Ambulatory Visit (INDEPENDENT_AMBULATORY_CARE_PROVIDER_SITE_OTHER): Payer: Medicare Other | Admitting: *Deleted

## 2021-03-22 DIAGNOSIS — I700 Atherosclerosis of aorta: Secondary | ICD-10-CM | POA: Diagnosis not present

## 2021-03-22 DIAGNOSIS — J439 Emphysema, unspecified: Secondary | ICD-10-CM | POA: Diagnosis not present

## 2021-03-22 DIAGNOSIS — Z85118 Personal history of other malignant neoplasm of bronchus and lung: Secondary | ICD-10-CM | POA: Diagnosis not present

## 2021-03-22 DIAGNOSIS — Z09 Encounter for follow-up examination after completed treatment for conditions other than malignant neoplasm: Secondary | ICD-10-CM | POA: Diagnosis not present

## 2021-03-22 DIAGNOSIS — Z5181 Encounter for therapeutic drug level monitoring: Secondary | ICD-10-CM | POA: Diagnosis not present

## 2021-03-22 DIAGNOSIS — Z952 Presence of prosthetic heart valve: Secondary | ICD-10-CM

## 2021-03-22 DIAGNOSIS — I10 Essential (primary) hypertension: Secondary | ICD-10-CM | POA: Diagnosis not present

## 2021-03-22 DIAGNOSIS — K760 Fatty (change of) liver, not elsewhere classified: Secondary | ICD-10-CM | POA: Diagnosis not present

## 2021-03-22 DIAGNOSIS — E039 Hypothyroidism, unspecified: Secondary | ICD-10-CM | POA: Diagnosis not present

## 2021-03-22 DIAGNOSIS — E538 Deficiency of other specified B group vitamins: Secondary | ICD-10-CM | POA: Diagnosis not present

## 2021-03-22 DIAGNOSIS — D509 Iron deficiency anemia, unspecified: Secondary | ICD-10-CM | POA: Diagnosis not present

## 2021-03-22 DIAGNOSIS — C3401 Malignant neoplasm of right main bronchus: Secondary | ICD-10-CM | POA: Diagnosis not present

## 2021-03-22 DIAGNOSIS — C189 Malignant neoplasm of colon, unspecified: Secondary | ICD-10-CM | POA: Diagnosis not present

## 2021-03-22 DIAGNOSIS — C786 Secondary malignant neoplasm of retroperitoneum and peritoneum: Secondary | ICD-10-CM | POA: Diagnosis not present

## 2021-03-22 DIAGNOSIS — I2510 Atherosclerotic heart disease of native coronary artery without angina pectoris: Secondary | ICD-10-CM | POA: Diagnosis not present

## 2021-03-22 DIAGNOSIS — Z681 Body mass index (BMI) 19 or less, adult: Secondary | ICD-10-CM | POA: Diagnosis not present

## 2021-03-22 DIAGNOSIS — I712 Thoracic aortic aneurysm, without rupture: Secondary | ICD-10-CM | POA: Diagnosis not present

## 2021-03-22 DIAGNOSIS — Z9049 Acquired absence of other specified parts of digestive tract: Secondary | ICD-10-CM | POA: Diagnosis not present

## 2021-03-22 DIAGNOSIS — E063 Autoimmune thyroiditis: Secondary | ICD-10-CM | POA: Diagnosis not present

## 2021-03-22 LAB — POCT INR: INR: 3.4 — AB (ref 2.0–3.0)

## 2021-03-22 NOTE — Patient Instructions (Signed)
Take warfarin 1mg  tonight then decrease dose to 2mg  daily except 3mg  on Thursdays Recheck on 04/01/21

## 2021-03-25 DIAGNOSIS — R634 Abnormal weight loss: Secondary | ICD-10-CM | POA: Diagnosis not present

## 2021-03-25 DIAGNOSIS — D500 Iron deficiency anemia secondary to blood loss (chronic): Secondary | ICD-10-CM | POA: Diagnosis not present

## 2021-03-25 DIAGNOSIS — Z5112 Encounter for antineoplastic immunotherapy: Secondary | ICD-10-CM | POA: Diagnosis not present

## 2021-03-25 DIAGNOSIS — C3401 Malignant neoplasm of right main bronchus: Secondary | ICD-10-CM | POA: Diagnosis not present

## 2021-03-25 DIAGNOSIS — J9859 Other diseases of mediastinum, not elsewhere classified: Secondary | ICD-10-CM | POA: Diagnosis not present

## 2021-03-26 ENCOUNTER — Telehealth: Payer: Self-pay | Admitting: Family Medicine

## 2021-03-26 NOTE — Telephone Encounter (Signed)
Patient wants you to call her about there coumadin appointment , she needs to change it for Monday.  Did not want me to change without talking to you first

## 2021-03-26 NOTE — Telephone Encounter (Signed)
INR appt changed from Monday afternoon to Monday morning per pt request.

## 2021-03-28 DIAGNOSIS — Z23 Encounter for immunization: Secondary | ICD-10-CM | POA: Diagnosis not present

## 2021-04-01 ENCOUNTER — Ambulatory Visit (INDEPENDENT_AMBULATORY_CARE_PROVIDER_SITE_OTHER): Payer: Medicare Other | Admitting: *Deleted

## 2021-04-01 ENCOUNTER — Other Ambulatory Visit: Payer: Self-pay

## 2021-04-01 DIAGNOSIS — Z5181 Encounter for therapeutic drug level monitoring: Secondary | ICD-10-CM

## 2021-04-01 DIAGNOSIS — Z952 Presence of prosthetic heart valve: Secondary | ICD-10-CM

## 2021-04-01 LAB — POCT INR: INR: 4.4 — AB (ref 2.0–3.0)

## 2021-04-01 NOTE — Patient Instructions (Signed)
Hold warfarin tonight, take 1mg  tomorrow night then decrease dose to 2mg  daily. Recheck on 04/10/21

## 2021-04-05 DIAGNOSIS — C7880 Secondary malignant neoplasm of unspecified digestive organ: Secondary | ICD-10-CM | POA: Diagnosis not present

## 2021-04-05 DIAGNOSIS — Z681 Body mass index (BMI) 19 or less, adult: Secondary | ICD-10-CM | POA: Diagnosis not present

## 2021-04-05 DIAGNOSIS — C3401 Malignant neoplasm of right main bronchus: Secondary | ICD-10-CM | POA: Diagnosis not present

## 2021-04-05 DIAGNOSIS — Z09 Encounter for follow-up examination after completed treatment for conditions other than malignant neoplasm: Secondary | ICD-10-CM | POA: Diagnosis not present

## 2021-04-05 DIAGNOSIS — J9859 Other diseases of mediastinum, not elsewhere classified: Secondary | ICD-10-CM | POA: Diagnosis not present

## 2021-04-05 DIAGNOSIS — R634 Abnormal weight loss: Secondary | ICD-10-CM | POA: Diagnosis not present

## 2021-04-05 DIAGNOSIS — E039 Hypothyroidism, unspecified: Secondary | ICD-10-CM | POA: Diagnosis not present

## 2021-04-08 DIAGNOSIS — C3401 Malignant neoplasm of right main bronchus: Secondary | ICD-10-CM | POA: Diagnosis not present

## 2021-04-08 DIAGNOSIS — R634 Abnormal weight loss: Secondary | ICD-10-CM | POA: Diagnosis not present

## 2021-04-08 DIAGNOSIS — Z5112 Encounter for antineoplastic immunotherapy: Secondary | ICD-10-CM | POA: Diagnosis not present

## 2021-04-08 DIAGNOSIS — J9859 Other diseases of mediastinum, not elsewhere classified: Secondary | ICD-10-CM | POA: Diagnosis not present

## 2021-04-10 ENCOUNTER — Ambulatory Visit (INDEPENDENT_AMBULATORY_CARE_PROVIDER_SITE_OTHER): Payer: Medicare Other | Admitting: *Deleted

## 2021-04-10 DIAGNOSIS — Z5181 Encounter for therapeutic drug level monitoring: Secondary | ICD-10-CM

## 2021-04-10 DIAGNOSIS — Z952 Presence of prosthetic heart valve: Secondary | ICD-10-CM | POA: Diagnosis not present

## 2021-04-10 LAB — POCT INR: INR: 4 — AB (ref 2.0–3.0)

## 2021-04-10 NOTE — Patient Instructions (Signed)
Hold warfarin tonight then resume 2mg  daily. Recheck on 04/18/21

## 2021-04-16 DIAGNOSIS — K219 Gastro-esophageal reflux disease without esophagitis: Secondary | ICD-10-CM | POA: Diagnosis not present

## 2021-04-16 DIAGNOSIS — C785 Secondary malignant neoplasm of large intestine and rectum: Secondary | ICD-10-CM | POA: Diagnosis not present

## 2021-04-16 DIAGNOSIS — J449 Chronic obstructive pulmonary disease, unspecified: Secondary | ICD-10-CM | POA: Diagnosis not present

## 2021-04-16 DIAGNOSIS — E039 Hypothyroidism, unspecified: Secondary | ICD-10-CM | POA: Diagnosis not present

## 2021-04-16 DIAGNOSIS — F17210 Nicotine dependence, cigarettes, uncomplicated: Secondary | ICD-10-CM | POA: Diagnosis not present

## 2021-04-16 DIAGNOSIS — I2510 Atherosclerotic heart disease of native coronary artery without angina pectoris: Secondary | ICD-10-CM | POA: Diagnosis not present

## 2021-04-18 ENCOUNTER — Ambulatory Visit (INDEPENDENT_AMBULATORY_CARE_PROVIDER_SITE_OTHER): Payer: Medicare Other | Admitting: *Deleted

## 2021-04-18 ENCOUNTER — Other Ambulatory Visit: Payer: Self-pay

## 2021-04-18 DIAGNOSIS — Z5181 Encounter for therapeutic drug level monitoring: Secondary | ICD-10-CM | POA: Diagnosis not present

## 2021-04-18 DIAGNOSIS — Z952 Presence of prosthetic heart valve: Secondary | ICD-10-CM

## 2021-04-18 LAB — POCT INR: INR: 2 (ref 2.0–3.0)

## 2021-04-18 NOTE — Patient Instructions (Signed)
Continue warfarin 2mg  daily. Recheck on 04/18/21

## 2021-04-19 DIAGNOSIS — C7880 Secondary malignant neoplasm of unspecified digestive organ: Secondary | ICD-10-CM | POA: Diagnosis not present

## 2021-04-19 DIAGNOSIS — J9859 Other diseases of mediastinum, not elsewhere classified: Secondary | ICD-10-CM | POA: Diagnosis not present

## 2021-04-19 DIAGNOSIS — C3401 Malignant neoplasm of right main bronchus: Secondary | ICD-10-CM | POA: Diagnosis not present

## 2021-04-19 DIAGNOSIS — R634 Abnormal weight loss: Secondary | ICD-10-CM | POA: Diagnosis not present

## 2021-04-22 DIAGNOSIS — C785 Secondary malignant neoplasm of large intestine and rectum: Secondary | ICD-10-CM | POA: Diagnosis not present

## 2021-04-22 DIAGNOSIS — D649 Anemia, unspecified: Secondary | ICD-10-CM | POA: Diagnosis not present

## 2021-04-22 DIAGNOSIS — I2510 Atherosclerotic heart disease of native coronary artery without angina pectoris: Secondary | ICD-10-CM | POA: Diagnosis not present

## 2021-04-22 DIAGNOSIS — E538 Deficiency of other specified B group vitamins: Secondary | ICD-10-CM | POA: Diagnosis not present

## 2021-04-22 DIAGNOSIS — E039 Hypothyroidism, unspecified: Secondary | ICD-10-CM | POA: Diagnosis not present

## 2021-04-22 DIAGNOSIS — R102 Pelvic and perineal pain: Secondary | ICD-10-CM | POA: Diagnosis not present

## 2021-04-22 DIAGNOSIS — Z681 Body mass index (BMI) 19 or less, adult: Secondary | ICD-10-CM | POA: Diagnosis not present

## 2021-04-24 DIAGNOSIS — C3401 Malignant neoplasm of right main bronchus: Secondary | ICD-10-CM | POA: Diagnosis not present

## 2021-04-24 DIAGNOSIS — Z5112 Encounter for antineoplastic immunotherapy: Secondary | ICD-10-CM | POA: Diagnosis not present

## 2021-04-24 DIAGNOSIS — R634 Abnormal weight loss: Secondary | ICD-10-CM | POA: Diagnosis not present

## 2021-04-24 DIAGNOSIS — J9859 Other diseases of mediastinum, not elsewhere classified: Secondary | ICD-10-CM | POA: Diagnosis not present

## 2021-04-25 ENCOUNTER — Other Ambulatory Visit: Payer: Self-pay

## 2021-04-25 ENCOUNTER — Ambulatory Visit (INDEPENDENT_AMBULATORY_CARE_PROVIDER_SITE_OTHER): Payer: Medicare Other | Admitting: *Deleted

## 2021-04-25 DIAGNOSIS — Z952 Presence of prosthetic heart valve: Secondary | ICD-10-CM

## 2021-04-25 DIAGNOSIS — Z5181 Encounter for therapeutic drug level monitoring: Secondary | ICD-10-CM

## 2021-04-25 LAB — POCT INR: INR: 2.7 (ref 2.0–3.0)

## 2021-04-25 NOTE — Patient Instructions (Signed)
Has immune therapy 10/12.  Due again on 10/26 Continue warfarin 2mg  daily. Recheck on 05/09/21

## 2021-05-03 DIAGNOSIS — J9859 Other diseases of mediastinum, not elsewhere classified: Secondary | ICD-10-CM | POA: Diagnosis not present

## 2021-05-03 DIAGNOSIS — M818 Other osteoporosis without current pathological fracture: Secondary | ICD-10-CM | POA: Diagnosis not present

## 2021-05-03 DIAGNOSIS — C7880 Secondary malignant neoplasm of unspecified digestive organ: Secondary | ICD-10-CM | POA: Diagnosis not present

## 2021-05-03 DIAGNOSIS — S22000A Wedge compression fracture of unspecified thoracic vertebra, initial encounter for closed fracture: Secondary | ICD-10-CM | POA: Diagnosis not present

## 2021-05-03 DIAGNOSIS — Z09 Encounter for follow-up examination after completed treatment for conditions other than malignant neoplasm: Secondary | ICD-10-CM | POA: Diagnosis not present

## 2021-05-03 DIAGNOSIS — C3401 Malignant neoplasm of right main bronchus: Secondary | ICD-10-CM | POA: Diagnosis not present

## 2021-05-03 DIAGNOSIS — E039 Hypothyroidism, unspecified: Secondary | ICD-10-CM | POA: Diagnosis not present

## 2021-05-03 DIAGNOSIS — R634 Abnormal weight loss: Secondary | ICD-10-CM | POA: Diagnosis not present

## 2021-05-03 DIAGNOSIS — Z681 Body mass index (BMI) 19 or less, adult: Secondary | ICD-10-CM | POA: Diagnosis not present

## 2021-05-03 DIAGNOSIS — Z7189 Other specified counseling: Secondary | ICD-10-CM | POA: Diagnosis not present

## 2021-05-07 DIAGNOSIS — Z5112 Encounter for antineoplastic immunotherapy: Secondary | ICD-10-CM | POA: Diagnosis not present

## 2021-05-07 DIAGNOSIS — C3401 Malignant neoplasm of right main bronchus: Secondary | ICD-10-CM | POA: Diagnosis not present

## 2021-05-07 DIAGNOSIS — R634 Abnormal weight loss: Secondary | ICD-10-CM | POA: Diagnosis not present

## 2021-05-07 DIAGNOSIS — J9859 Other diseases of mediastinum, not elsewhere classified: Secondary | ICD-10-CM | POA: Diagnosis not present

## 2021-05-09 ENCOUNTER — Other Ambulatory Visit: Payer: Self-pay

## 2021-05-09 ENCOUNTER — Ambulatory Visit (INDEPENDENT_AMBULATORY_CARE_PROVIDER_SITE_OTHER): Payer: Medicare Other | Admitting: *Deleted

## 2021-05-09 DIAGNOSIS — Z5181 Encounter for therapeutic drug level monitoring: Secondary | ICD-10-CM

## 2021-05-09 DIAGNOSIS — Z952 Presence of prosthetic heart valve: Secondary | ICD-10-CM | POA: Diagnosis not present

## 2021-05-09 LAB — POCT INR: INR: 2.9 (ref 2.0–3.0)

## 2021-05-09 NOTE — Patient Instructions (Signed)
Description   Has immune therapy 10/25  Due again on 11/8 Continue warfarin 2mg  daily. Recheck on 11/9

## 2021-05-13 DIAGNOSIS — R600 Localized edema: Secondary | ICD-10-CM | POA: Diagnosis not present

## 2021-05-13 DIAGNOSIS — J449 Chronic obstructive pulmonary disease, unspecified: Secondary | ICD-10-CM | POA: Diagnosis not present

## 2021-05-13 DIAGNOSIS — K2100 Gastro-esophageal reflux disease with esophagitis, without bleeding: Secondary | ICD-10-CM | POA: Diagnosis not present

## 2021-05-17 DIAGNOSIS — R634 Abnormal weight loss: Secondary | ICD-10-CM | POA: Diagnosis not present

## 2021-05-17 DIAGNOSIS — C7880 Secondary malignant neoplasm of unspecified digestive organ: Secondary | ICD-10-CM | POA: Diagnosis not present

## 2021-05-17 DIAGNOSIS — J9859 Other diseases of mediastinum, not elsewhere classified: Secondary | ICD-10-CM | POA: Diagnosis not present

## 2021-05-17 DIAGNOSIS — M818 Other osteoporosis without current pathological fracture: Secondary | ICD-10-CM | POA: Diagnosis not present

## 2021-05-17 DIAGNOSIS — Z681 Body mass index (BMI) 19 or less, adult: Secondary | ICD-10-CM | POA: Diagnosis not present

## 2021-05-17 DIAGNOSIS — D500 Iron deficiency anemia secondary to blood loss (chronic): Secondary | ICD-10-CM | POA: Diagnosis not present

## 2021-05-17 DIAGNOSIS — C3401 Malignant neoplasm of right main bronchus: Secondary | ICD-10-CM | POA: Diagnosis not present

## 2021-05-17 DIAGNOSIS — E039 Hypothyroidism, unspecified: Secondary | ICD-10-CM | POA: Diagnosis not present

## 2021-05-19 NOTE — Progress Notes (Signed)
Cardiology Office Note  Date: 05/20/2021   ID: Judy Sawyer, DOB [birth year], MRN [id number]  PCP:  Denny Levy, PA  Cardiologist:  None Electrophysiologist:  None   Chief Complaint: Follow-up mechanical aortic valve replacement  History of Present Illness: Judy Sawyer is a 65 y.o. 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  History of ascending thoracic aortic aneurysm, COPD, esophageal strictures with multiple esophageal dilatations, chronic back pain.  She was last here 06/15/2020 for follow-up status post recent echocardiogram, stress test, and CT scan.  CT scan to reassess aortic aneurysm showed a 4.3 cm stable uncomplicated fusiform aneurysmal dilation of the ascending thoracic aorta grossly unchanged from previous scan in 2019.  Stress test was negative for ischemia and deemed a low risk study.  Echocardiogram showed EF of 60 to 65% some mild LVH and mild aortic regurgitation.  She denies any symptoms other than continuing chronic dyspnea.  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  Otherwise she denies any anginal symptoms, palpitations or arrhythmias, orthostatic symptoms, PND, orthopnea, claudication, DVT or PE-like symptoms, or lower extremity edema.  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  She sees Dr. Candie Echevaria oncology [facility].  She had a laparoscopic colectomy with partial anastomosis on 08/03/2020 by Dr. Cecil Cobbs.  Operative findings were significant for mass involving most of the sigmoid colon with associated adenopathy.  1 mesenteric mass was adherent to the retroperitoneum.  Additional retroperitoneum resected and necessary to remove all gross disease.  Isolated omental metastasis also resected.  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  She denies any changes since last visit. States she has been doing well since surgery and has been continuing her immunotherapy with Dr. Candie Echevaria with nivolumab.  She initially was supposed to receive only 12 treatments but it was determined she would have continuing immunotherapy treatments to keep lung cancer at [place] per her statement..  She will have an upcoming CT to reassess.  She denies any anginal symptoms, orthostatic symptoms, CVA or TIA-like symptoms, bleeding, PND, orthopnea, palpitations.  No claudication-like symptoms, DVT or PE-like symptoms.  Blood pressure is low normal at 100/60.  She is asymptomatic.  She continues to work without any issues.  She has an upcoming follow-up CT to reassess aortic aneurysm in December.  She states no one has called her.  I discussed we would have nursing staff have imaging call her to give her a date for the follow-up CT.  Her EKG today shows normal sinus rhythm with a rate of 68, right atrial enlargement.   .  Past Medical History:  Diagnosis Date   Acute bronchitis    Anemia, unspecified    Aortic stenosis    previous cardiologisit: Dr. Remigio Eisenmenger in [place], [facility])    Carotid stenosis 05/13   mild less than 50% bilaterally   Chest pain    Collagen vascular disease (HCC)    COPD (chronic obstructive pulmonary disease) ([place])    Fibromyalgia    GERD (gastroesophageal reflux disease)    Hypertension    Murmur    Tobacco abuse     Past Surgical History:  Procedure Laterality Date   ABDOMINAL HYSTERECTOMY     AORTIC VALVE REPLACEMENT  11/2011  in [place]. St. Jude 19 mm   COLONOSCOPY     KNEE ARTHROSCOPY WITH LATERAL MENISECTOMY Left 08/16/2014   Procedure: LEFT KNEE ARTHROSCOPY WITH LATERAL MENISECTOMY;  Surgeon: Lorn Junes, MD;  Location: [place];  Service: Orthopedics;  Laterality: Left;   KNEE ARTHROSCOPY WITH MEDIAL MENISECTOMY Left 08/16/2014    Procedure: KNEE ARTHROSCOPY WITH MEDIAL MENISECTOMY;  Surgeon: Lorn Junes, MD;  Location: [place];  Service: Orthopedics;  Laterality: Left;   TONSILLECTOMY     UPPER GI ENDOSCOPY      Current Outpatient Medications  Medication Sig Dispense Refill   aspirin EC 81 MG tablet Take 81 mg by mouth daily.     calcium carbonate (OSCAL) 1500 (600 Ca) MG TABS tablet Take 1 tablet by mouth daily with breakfast.     Cholecalciferol (VITAMIN D3) 1000 units CAPS Take 1 capsule by mouth daily.     EUTHYROX 125 MCG tablet Take 125 mcg by mouth every morning.     fluticasone (FLONASE) 50 MCG/ACT nasal spray Place into both nostrils as needed for allergies or rhinitis.     HYDROcodone-acetaminophen (NORCO) 7.5-325 MG tablet Take 1 tablet by mouth daily.     ipilimumab (YERVOY) 200 MG/40ML SOLN Inject into the vein.     levothyroxine (SYNTHROID) 25 MCG tablet Take 1 tablet by mouth in the morning and at bedtime.     Melatonin 5 MG TABS Take 1 tablet by mouth daily.     metoprolol tartrate (LOPRESSOR) 25 MG tablet Take 0.5 tablets (12.5 mg total) by mouth 2 (two) times daily. 90 tablet 1   NIVOLUMAB IV Inject into the vein.     pantoprazole (PROTONIX) 40 MG tablet Take 40 mg by mouth 2 (two) times daily.      prochlorperazine (COMPAZINE) 10 MG tablet Take 10 mg by mouth every 6 (six) hours as needed for nausea or vomiting.     Sennosides 8.6 MG CAPS Take 1 capsule by mouth daily as needed.      warfarin (COUMADIN) 2 MG tablet Take 1 tablet on Sundays, Mondays, Wednesdays and Fridays or as directed.  (Will take 3mg  on T, Thur, Sat) 30 tablet 3   No current facility-administered medications for this visit.   Allergies:  Codeine   Social History: The patient  reports that she quit smoking about 3 years ago. Her smoking use included cigarettes. She started smoking about 51 years ago. She has a 21.00 pack-year smoking history. She has never used smokeless tobacco. She reports that she does  not drink alcohol and does not use drugs.   Family History: The patient's family history includes Cancer in her mother; Diabetes type II in her sister; Emphysema in her father; Heart attack in her mother; Heart disease in her sister; Liver disease in her father; Rheumatic fever in her sister.   ROS:  Please see the history of present illness. Otherwise, complete review of systems is positive for none.  All other systems are reviewed and negative.   Physical Exam: VS:  BP 130/70   Pulse 68   Ht 5\' 3"  (1.6 m)   Wt 103 lb (46.7 kg)   BMI 18.25 kg/m , BMI Body mass index is 18.25 kg/m.  Wt Readings from Last 3 Encounters:  05/20/21 103 lb (46.7 kg)  12/24/20 107 lb 6.4 oz (48.7 kg)  06/15/20 114 lb (51.7 kg)    General: Thin appearing patient appears comfortable at rest. Neck: Supple, no elevated JVP  or carotid bruits, no thyromegaly. Lungs: Mild expiratory wheeze heard bilateral posterior lung fields, nonlabored breathing at rest. Cardiac: Regular rate and rhythm, no S3 or significant systolic murmur, no pericardial rub. Extremities: No pitting edema, distal pulses 2+. Skin: Warm and dry. Musculoskeletal: No kyphosis. Neuropsychiatric: Alert and oriented x3, affect grossly appropriate.  ECG:  An ECG dated 04/24/2020 was personally reviewed today and demonstrated:  EKG shows normal sinus rhythm rate of 77, right atrial enlargement, septal infarct, age undetermined.  Recent Labwork: No results found for requested labs within last 8760 hours.  No results found for: CHOL, TRIG, HDL, CHOLHDL, VLDL, LDLCALC, LDLDIRECT  Other Studies Reviewed Today:  CTA chest 05/19/2020 IMPRESSION: 1. Worsening masslike thickening about the right hilum, potentially the sequela of evolving radiation change though progressive disease is not excluded on the basis of this examination. Further evaluation with PET-CT could be performed as indicated. 2. Stable uncomplicated fusiform aneurysmal dilatation  of the ascending thoracic aorta measuring 43 mm in maximal diameter, grossly unchanged compared to the 04/2018 examination. Recommend annual imaging followup by CTA or MRA. This recommendation follows 2010 ACCF/AHA/AATS/ACR/ASA/SCA/SCAI/SIR/STS/SVM Guidelines for the Diagnosis and Management of Patients with Thoracic Aortic Disease. Circulation. 2010; 121: M196-Q229. Aortic aneurysm NOS (ICD10-I71.9) 3. Stable sequela of previous open aortic valve repair. 4. Coronary calcifications.  Aortic Atherosclerosis (ICD10-I70.0). 5.  Emphysema (ICD10-J43.9).   NST 05/04/2020 Study Result  Narrative & Impression  There was no ST segment deviation noted during stress. The study is normal. There are no perfusion defects This is a low risk study. The left ventricular ejection fraction is hyperdynamic (>65%).     Echocardiogram 05/04/2020  Left ventricular ejection fraction, by estimation, is 60 to 65%. The left ventricle has normal function. The left ventricle has no regional wall motion abnormalities. There is mild left ventricular hypertrophy. Left ventricular diastolic parameters are indeterminate. 2. Right ventricular systolic function is normal. The right ventricular size is normal. 3. The mitral valve is normal in structure. No evidence of mitral valve regurgitation. No evidence of mitral stenosis. 4. According to medical record St Jude mechanical valve 22mm is in the AV position.. The aortic valve has been repaired/replaced. Aortic valve regurgitation is mild. No aortic stenosis is present. 5. The inferior vena cava is normal in size with greater than 50% respiratory variability, suggesting right atrial pressure of 3 mmHg.    Lower venous Doppler study of left leg 02/01/2020 [facility] Impressions Performed At  1. No evidence of DVT within the left lower extremity.   2. Note made of an approximately 2.6 cm hypoechoic lobulated fluid   collection with the subcutaneous tissues  subjacent to the area of   bruising involving the aspect the left calf, nonspecific though   given overlying bruising may represent an evolving hematoma.   Clinical correlation is advised             Oncology History Overview Note  1/19: PET/CT:  Impression  - Demonstration of a large hypermetabolic soft tissue mass in the right aspect of the mediastinum from the level of the aortic arch extending down to encompass the right mainstem bronchus as described above. There is also a separate hypermetabolic right paratracheal lymph node just superior to the mass as described above.  - Hypermetabolic left level 2A lymph node as described above in which metastatic disease cannot be excluded.  - Mildly hypermetabolic subpleural pulmonary opacity in the inferior lateral aspect of the right middle lobe as described above. This may be inflammation  from scarring, recommend attention on follow-up imaging.      02/11/2019 at [facility] Chest CT showed ascending aorta measures 4.5 cm  Recommend semiannual imaging follow-up CTA or MRI and referral to cardiothoracic surgery if not already obtained.  Echocardiogram 04/09/2017 Study Conclusions   - Left ventricle: The cavity size was normal. Wall thickness was    increased in a pattern of mild LVH. Systolic function was normal.    The estimated ejection fraction was in the range of 60% to 65%.    Wall motion was normal; there were no regional wall motion    abnormalities. Doppler parameters are consistent with abnormal    left ventricular relaxation (grade 1 diastolic dysfunction).  - Aortic valve: A 19 mm St. Jude mechanical prosthesis is in the    aortic position. There was trivial regurgitation. Mean gradient    (S): 11 mm Hg. Peak gradient (S): 21 mm Hg. VTI ratio of LVOT to    aortic valve: 0.64.  - Mitral valve: Mildly calcified annulus. Mildly thickened leaflets    . There was trivial regurgitation.  - Right atrium: Central venous pressure  (est): 3 mm Hg.  - Atrial septum: The interatrial septum was hypermobile. No defect    or patent foramen ovale was identified.  - Tricuspid valve: There was trivial regurgitation.  - Pulmonary arteries: PA peak pressure: 34 mm Hg (S).  - Pericardium, extracardiac: There was no pericardial effusion.   Impressions:   - Mild LVH with LVEF 60-65% and grade 1 diastolic dysfunction.    Mildly calcified mitral annulus and thickened leaflets with    trivial mitral regurgitation. St. Jude mechanical prosthesis in    aortic position with trivial aortic regurgitation and gradients    suggesting grossly normal function. Trivial tricuspid    regurgitation with estimated PASP 34 mmHg.   Assessment and Plan:   .1. Thoracic ascending aortic aneurysm [facility]) History thoracic ascending aortic aneurysm.Chest CT 02/11/2019 showed ascending aorta measures 4.5 cm.  Recommended semiannual imaging follow-up CTA or MRI and referral to cardiothoracic surgery if not already obtained.  Repeat chest CT scan to reassess aortic aneurysm showed a 4.3 cm stable uncomplicated fusiform aneurysmal dilation of the ascending thoracic aorta grossly unchanged from previous scan in 2019.  She denies any issues today.  She has an upcoming repeat CT in December.  She states no one has called her regarding when to come for her CT.  I advised her I would have nursing call imaging department and have them call her.  2. H/O mechanical aortic valve replacement Last echocardiogram 03/20/2017 demonstrated mild LVH, EF 60 to 65%.  G1 DD, Saint Jude mechanical prosthesis in the aortic position trivial regurgitation.  Trivial mitral regurgitation, trivial tricuspid regurgitation.  Continue Coumadin as directed.  Continue aspirin 81 mg daily she is here  3. Essential hypertension Blood pressure on the low normal side at 130/70.  Heart rate 68.  Continue metoprolol to 12.5 mg p.o. twice daily.      Medication Adjustments/Labs and Tests  Ordered: Current medicines are reviewed at length with the patient today.  Concerns regarding medicines are outlined above.   Disposition: Follow-up with Dr. Harl Bowie or APP 6 months  Signed, Levell July, NP 05/20/2021 2:52 PM    [facility] [address], [place], [place] [address] Phone: [phone]; Fax: [phone]

## 2021-05-20 ENCOUNTER — Encounter: Payer: Self-pay | Admitting: Family Medicine

## 2021-05-20 ENCOUNTER — Ambulatory Visit (INDEPENDENT_AMBULATORY_CARE_PROVIDER_SITE_OTHER): Payer: Medicare Other | Admitting: Family Medicine

## 2021-05-20 VITALS — BP 130/70 | HR 68 | Ht 63.0 in | Wt 103.0 lb

## 2021-05-20 DIAGNOSIS — I10 Essential (primary) hypertension: Secondary | ICD-10-CM

## 2021-05-20 DIAGNOSIS — I7121 Aneurysm of the ascending aorta, without rupture: Secondary | ICD-10-CM | POA: Diagnosis not present

## 2021-05-20 DIAGNOSIS — Z952 Presence of prosthetic heart valve: Secondary | ICD-10-CM | POA: Diagnosis not present

## 2021-05-20 NOTE — Patient Instructions (Signed)
Medication Instructions:   Your physician recommends that you continue on your current medications as directed. Please refer to the Current Medication list given to you today.  *If you need a refill on your cardiac medications before your next appointment, please call your pharmacy*   Lab Work: [place]   If you have labs (blood work) drawn today and your tests are completely normal, you will receive your results only by: [place] (if you have MyChart) OR A paper copy in the mail If you have any lab test that is abnormal or we need to change your treatment, we will call you to review the results.   Testing/Procedures: NONE ORDERED  TODAY     Follow-Up: At [facility], you and your health needs are our priority.  As part of our continuing mission to provide you with exceptional heart care, we have created designated Provider Care Teams.  These Care Teams include your primary Cardiologist (physician) and Advanced Practice Providers (APPs -  Physician Assistants and Nurse Practitioners) who all work together to provide you with the care you need, when you need it.  We recommend signing up for the patient portal called "MyChart".  Sign up information is provided on this After Visit Summary.  MyChart is used to connect with patients for Virtual Visits (Telemedicine).  Patients are able to view lab/test results, encounter notes, upcoming appointments, etc.  Non-urgent messages can be sent to your provider as well.   To learn more about what you can do with MyChart, go to [email or web address].    Your next appointment:   6 month(s)  The format for your next appointment:   In Person  Provider:   You may see  Katina Dung, NP    Other Instructions

## 2021-05-21 DIAGNOSIS — C3401 Malignant neoplasm of right main bronchus: Secondary | ICD-10-CM | POA: Diagnosis not present

## 2021-05-21 DIAGNOSIS — J9859 Other diseases of mediastinum, not elsewhere classified: Secondary | ICD-10-CM | POA: Diagnosis not present

## 2021-05-21 DIAGNOSIS — C7880 Secondary malignant neoplasm of unspecified digestive organ: Secondary | ICD-10-CM | POA: Diagnosis not present

## 2021-05-21 DIAGNOSIS — R634 Abnormal weight loss: Secondary | ICD-10-CM | POA: Diagnosis not present

## 2021-05-21 DIAGNOSIS — Z5112 Encounter for antineoplastic immunotherapy: Secondary | ICD-10-CM | POA: Diagnosis not present

## 2021-05-23 DIAGNOSIS — F17210 Nicotine dependence, cigarettes, uncomplicated: Secondary | ICD-10-CM | POA: Diagnosis not present

## 2021-05-23 DIAGNOSIS — E538 Deficiency of other specified B group vitamins: Secondary | ICD-10-CM | POA: Diagnosis not present

## 2021-05-24 ENCOUNTER — Telehealth: Payer: Self-pay | Admitting: Family Medicine

## 2021-05-24 NOTE — Telephone Encounter (Signed)
   [facility] Pre-operative Risk Assessment    Patient Name: Judy Sawyer  DOB: [birth year] MRN: [id number]  HEARTCARE STAFF:  - IMPORTANT!!!!!! Under Visit Info/Reason for Call, type in Other and utilize the format Clearance MM/DD/YY or Clearance TBD. Do not use dashes or single digits. - Please review there is not already an duplicate clearance open for this procedure. - If request is for dental extraction, please clarify the # of teeth to be extracted. - If the patient is currently at the dentist's office, call Pre-Op Callback Staff (MA/nurse) to input urgent request.  - If the patient is not currently in the dentist office, please route to the Pre-Op pool.  Request for surgical clearance:  What type of surgery is being performed? EGD  When is this surgery scheduled? TBD  What type of clearance is required (medical clearance vs. Pharmacy clearance to hold med vs. Both)? PHARMACY  Are there any medications that need to be held prior to surgery and how long? WARFARIN   Practice name and name of physician performing surgery? [facility]   What is the office phone number? [phone]   7.   What is the office fax number? [phone]  8.   Anesthesia type (None, local, MAC, general) ? NOT NOTED    Chanda Busing 05/24/2021, 4:44 PM  _________________________________________________________________   (provider comments below)

## 2021-05-27 NOTE — Telephone Encounter (Addendum)
   Name: Judy Sawyer  DOB: [birth year]  MRN: [id number]   Primary Cardiologist: Followed most recently by Katina Dung, NP, previously Dr. Bronson Ing  Chart reviewed as part of pre-operative protocol coverage. Patient was contacted 05/27/2021 in reference to pre-operative risk assessment for pending surgery as outlined below.  Judy Sawyer was last seen 05/20/21 by Katina Dung and doing well. I reached out to patient for update on how she is doing. The patient affirms she has been doing well without any new cardiac symptoms. Therefore, based on ACC/AHA guidelines, the patient would be at acceptable risk for the planned procedure without further cardiovascular testing. The patient was advised that if she develops new symptoms prior to surgery to contact our office to arrange for a follow-up visit, and she verbalized understanding.  HOWEVER - When I called to verify no interim symptoms, she indicated the procedure is scheduled tomorrow which would only give her 1 day of holding Coumadin. She did not take her dose today yet. She states she is only getting her esophagus stretched and that "in the past they've done it only holding one day of Coumadin." She stated if she needed to hold it longer, she has had to do a Lovenox bridge in the past. Per clearance today, no bridging needed and pharmacy team is no longer available.  Per pt report the procedure is scheduled tomorrow at 9:30 - they were asked to arrive 8:30am and live 2 hours away. I told her I would route this message to our nursing staff to call the GI office first thing in the AM to clarify whether they are OK with a 1 day hold. If NOT, will need to re-involve pharmacy team for input on Lovenox bridging. I will route to both our callback team AND pharmacy pool. PLEASE CALL PATIENT BACK ON CELL PHONE NUMBER [phone] WHEN PLAN IS CLARIFIED since they will be driving to have the procedure.  Julaine Hua is off tomorrow but she will leave a message for  Judy Sawyer to review first thing - I also sent her a message to make her aware.  Charlie Pitter, PA-C 05/27/2021, 5:29 PM

## 2021-05-27 NOTE — Telephone Encounter (Addendum)
Patient with diagnosis of mechanical aortic valve on warfarin for anticoagulation.    Procedure: EGD Date of procedure: TBD  CrCl Scr 0.81, CrCl= 50 (05/21/2021) Platelet count 406 (05/21/2021)  Per office protocol, patient can hold warfarin for 5 days prior to procedure.  Patient will NOT need bridge  Resume warfarin as soon as able post-op.   Cathrine Muster, PharmD PGY2 Cardiology Pharmacy Resident

## 2021-05-27 NOTE — Telephone Encounter (Signed)
Covering preop today. Recent OV 05/20/21, felt to be doing well with 6 month f/u planned per Katina Dung NP. Will route to pharm for input on warfarin.

## 2021-05-27 NOTE — Telephone Encounter (Signed)
Per Melina Copa, PAC who called me at about 5:43 pm in regard to pt for clearance. Please see notes from pre op provider Melina Copa, PAC. Per Lisbeth Renshaw, has asked for pre op CMA tomorrow to make this the first pt to be addressed. Pre op CMA/RMA  will need to reach out to GI team and make sure they are ok with the coumadin hold x 1 day. Pt procedure is tomorrow 05/28/21 @ 9:30 and will be driving in to be at location for procedure by 8:30 am.

## 2021-05-28 NOTE — Telephone Encounter (Signed)
Dr. Christa See, from [facility], returned my call.  He actually spoke with the pt last night.  Per Dr. Denice Bors, pt isn't on the schedule for her EGD today, 05/28/2021, and he advised the pt that she should not be preparing to come today.  He has sent a message to the Dr that normally does her EGD's and also to scheduling to see what happened.  He was concerned that pt wouldn't need a Lovenox Bridge for a Mechanical Aortic Valve, and stated that was how they have done her EGD's in the past.  I confirmed with Fuller Canada, Pharmacist here, and she confirms, pt does not need a Lovenox Bridge.  I will route this clearance to the requesting surgeon's office to make them aware of clearance below.

## 2021-05-28 NOTE — Telephone Encounter (Signed)
Called [facility] office to get an after hour # to call.  Called [phone] and asked for the GI Fellow on call. Left a # for them to call me back in reference to message below.

## 2021-05-29 ENCOUNTER — Ambulatory Visit (INDEPENDENT_AMBULATORY_CARE_PROVIDER_SITE_OTHER): Payer: Medicare Other | Admitting: *Deleted

## 2021-05-29 DIAGNOSIS — Z5181 Encounter for therapeutic drug level monitoring: Secondary | ICD-10-CM

## 2021-05-29 DIAGNOSIS — Z952 Presence of prosthetic heart valve: Secondary | ICD-10-CM | POA: Diagnosis not present

## 2021-05-29 LAB — POCT INR: INR: 3.1 — AB (ref 2.0–3.0)

## 2021-05-29 NOTE — Patient Instructions (Signed)
Had immune therapy 05/21/21  Next treatment 11/21 Take warfarin 1/2 tablet tonight then resume 1 tablet daily. Recheck on 11/30 Pending endoscopy on 07/16/20.  Pt prefers to continue doing Lovenox bridge as recommended by MD at [facility]

## 2021-05-31 DIAGNOSIS — C3401 Malignant neoplasm of right main bronchus: Secondary | ICD-10-CM | POA: Diagnosis not present

## 2021-05-31 DIAGNOSIS — R634 Abnormal weight loss: Secondary | ICD-10-CM | POA: Diagnosis not present

## 2021-05-31 DIAGNOSIS — Z09 Encounter for follow-up examination after completed treatment for conditions other than malignant neoplasm: Secondary | ICD-10-CM | POA: Diagnosis not present

## 2021-05-31 DIAGNOSIS — J431 Panlobular emphysema: Secondary | ICD-10-CM | POA: Diagnosis not present

## 2021-05-31 DIAGNOSIS — Z681 Body mass index (BMI) 19 or less, adult: Secondary | ICD-10-CM | POA: Diagnosis not present

## 2021-05-31 DIAGNOSIS — J9859 Other diseases of mediastinum, not elsewhere classified: Secondary | ICD-10-CM | POA: Diagnosis not present

## 2021-06-03 ENCOUNTER — Other Ambulatory Visit: Payer: Self-pay | Admitting: Family Medicine

## 2021-06-03 NOTE — Telephone Encounter (Signed)
Prescription refill request received for warfarin Lov: Judy Sawyer. 05/20/2021 Next INR check: 11/30 Warfarin tablet strength: 2mg 

## 2021-06-04 DIAGNOSIS — Z5112 Encounter for antineoplastic immunotherapy: Secondary | ICD-10-CM | POA: Diagnosis not present

## 2021-06-04 DIAGNOSIS — C3401 Malignant neoplasm of right main bronchus: Secondary | ICD-10-CM | POA: Diagnosis not present

## 2021-06-04 DIAGNOSIS — J9859 Other diseases of mediastinum, not elsewhere classified: Secondary | ICD-10-CM | POA: Diagnosis not present

## 2021-06-04 DIAGNOSIS — R634 Abnormal weight loss: Secondary | ICD-10-CM | POA: Diagnosis not present

## 2021-06-04 DIAGNOSIS — Z95828 Presence of other vascular implants and grafts: Secondary | ICD-10-CM | POA: Diagnosis not present

## 2021-06-12 ENCOUNTER — Ambulatory Visit (INDEPENDENT_AMBULATORY_CARE_PROVIDER_SITE_OTHER): Payer: Medicare Other | Admitting: *Deleted

## 2021-06-12 DIAGNOSIS — Z952 Presence of prosthetic heart valve: Secondary | ICD-10-CM | POA: Diagnosis not present

## 2021-06-12 DIAGNOSIS — Z5181 Encounter for therapeutic drug level monitoring: Secondary | ICD-10-CM

## 2021-06-12 LAB — POCT INR: INR: 5.9 — AB (ref 2.0–3.0)

## 2021-06-12 NOTE — Patient Instructions (Signed)
Hold warfarin tonight and tomorrow night, take 1/2 tablet on Friday then resume 1 tablet daily. Recheck on 12/7 Bleeding and fall precautions discussed with pt and she verbalized understanding. Pending endoscopy on 07/16/20.  Pt prefers to continue doing Lovenox bridge as recommended by MD at [facility]

## 2021-06-14 DIAGNOSIS — Z681 Body mass index (BMI) 19 or less, adult: Secondary | ICD-10-CM | POA: Diagnosis not present

## 2021-06-14 DIAGNOSIS — R634 Abnormal weight loss: Secondary | ICD-10-CM | POA: Diagnosis not present

## 2021-06-14 DIAGNOSIS — C3401 Malignant neoplasm of right main bronchus: Secondary | ICD-10-CM | POA: Diagnosis not present

## 2021-06-14 DIAGNOSIS — J9859 Other diseases of mediastinum, not elsewhere classified: Secondary | ICD-10-CM | POA: Diagnosis not present

## 2021-06-14 DIAGNOSIS — D500 Iron deficiency anemia secondary to blood loss (chronic): Secondary | ICD-10-CM | POA: Diagnosis not present

## 2021-06-14 DIAGNOSIS — Z09 Encounter for follow-up examination after completed treatment for conditions other than malignant neoplasm: Secondary | ICD-10-CM | POA: Diagnosis not present

## 2021-06-14 DIAGNOSIS — E039 Hypothyroidism, unspecified: Secondary | ICD-10-CM | POA: Diagnosis not present

## 2021-06-14 IMAGING — CT CT ANGIO CHEST
2 of 6 series · 16 of 36 positions shown · IV contrast (Omnipaque or Isovue)
Comparison: 10/25/2019; 07/26/2019; 07/01/2019; 02/11/2019;
12/03/2018; 10/21/2018

CLINICAL DATA: Evaluate thoracic aortic aneurysm. Worsening
shortness of breath for the past 3 months. History of lung cancer,
COPD, hypertension and previous aortic valve repair.

EXAM:
CT ANGIOGRAPHY CHEST WITH CONTRAST
TECHNIQUE: Multidetector CT imaging of the chest was performed using the
standard protocol during bolus administration of intravenous
contrast. Multiplanar CT image reconstructions and MIPs were
obtained to evaluate the vascular anatomy.
CONTRAST:  75mL OMNIPAQUE IOHEXOL 350 MG/ML SOLN

[Series 8: lungs · axial · 0.63mm/px · z∈[+1087,+1347]mm · 15 of 150 slices shown]
[im 10/150  lung]
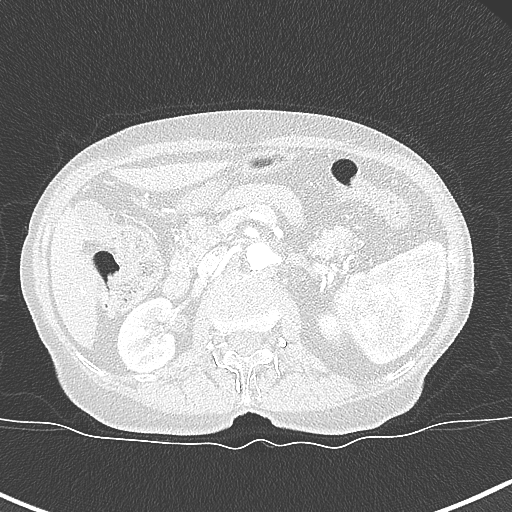
[im 19/150  mediastinal]
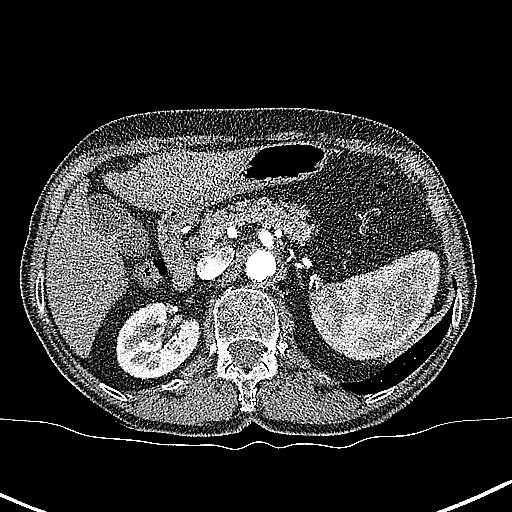
[im 28/150  lung]
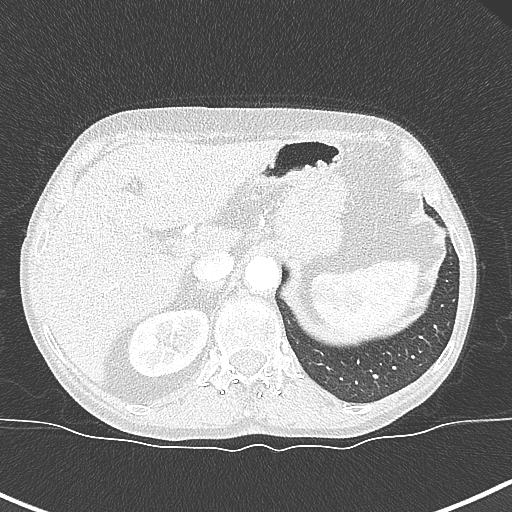
[im 38/150  mediastinal]
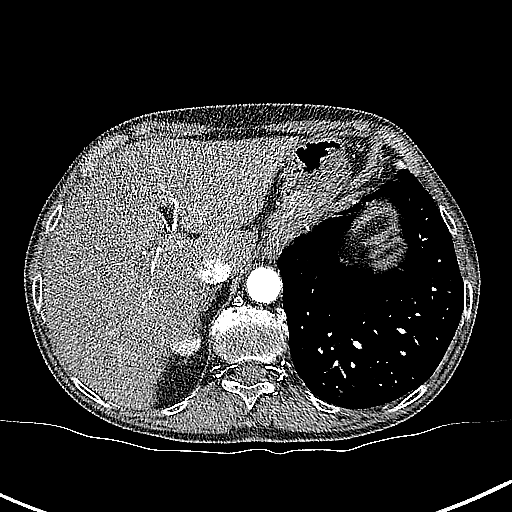
[im 47/150  lung]
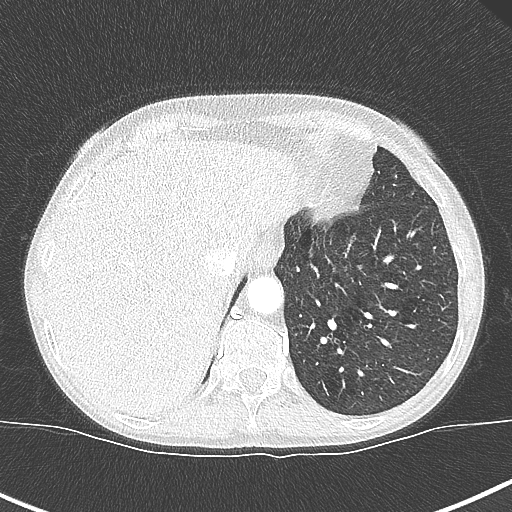
[im 56/150  mediastinal]
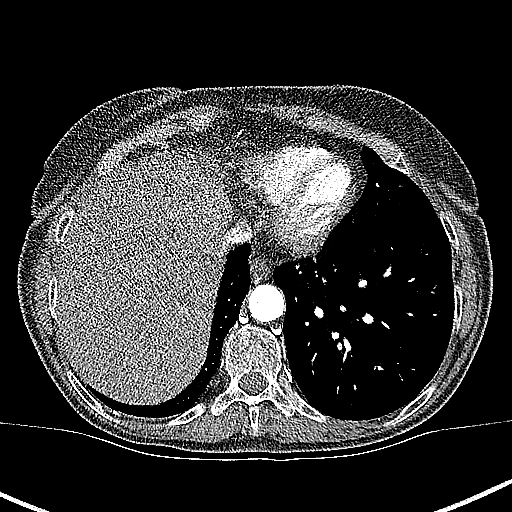
[im 66/150  lung]
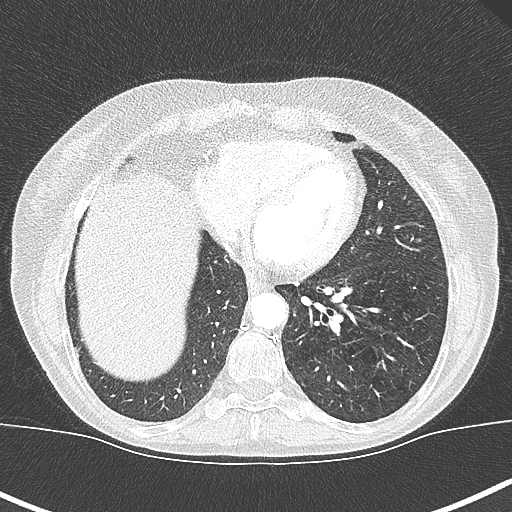
[im 75/150  mediastinal]
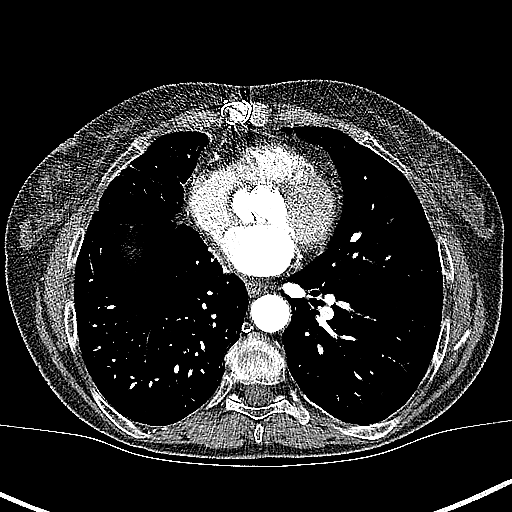
[im 84/150  lung]
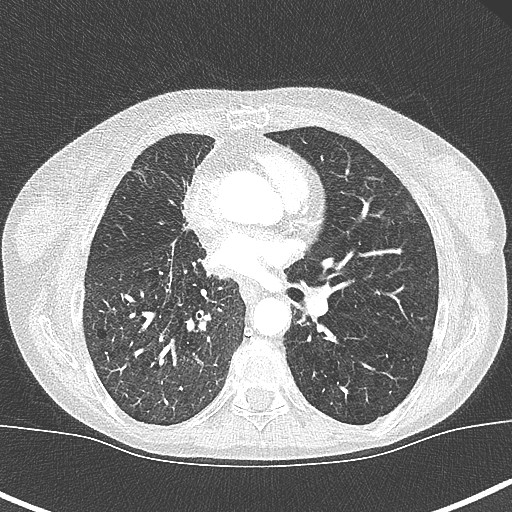
[im 94/150  mediastinal]
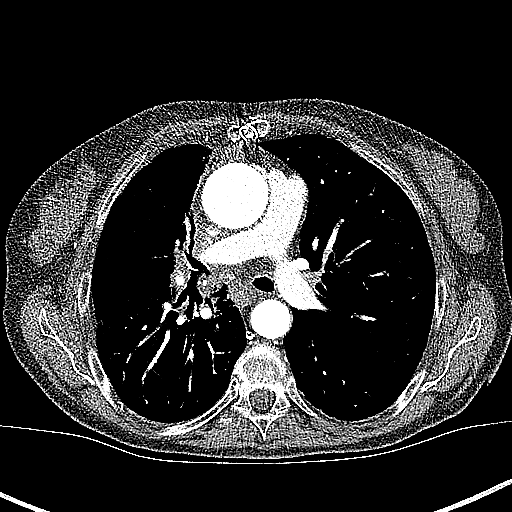
[im 103/150  lung]
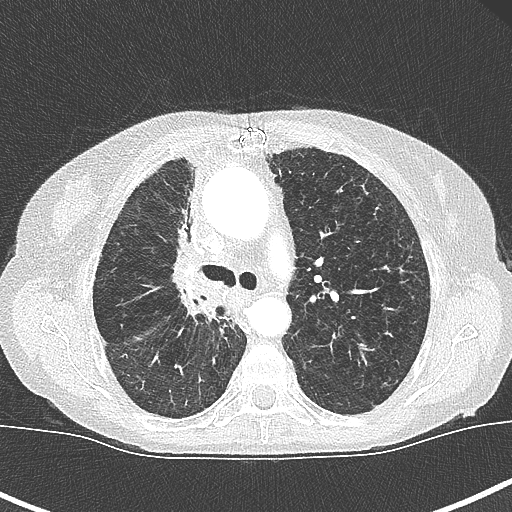
[im 112/150  mediastinal]
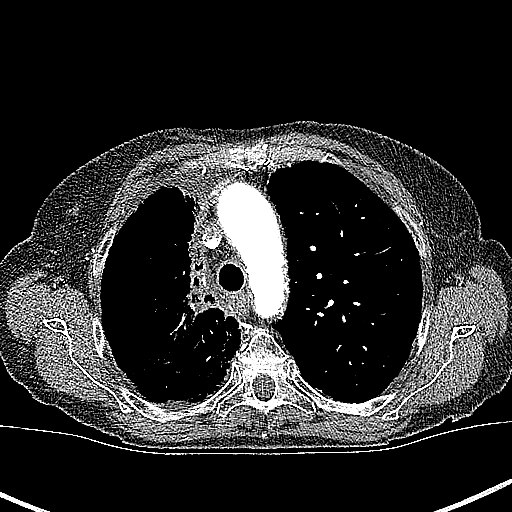
[im 122/150  lung]
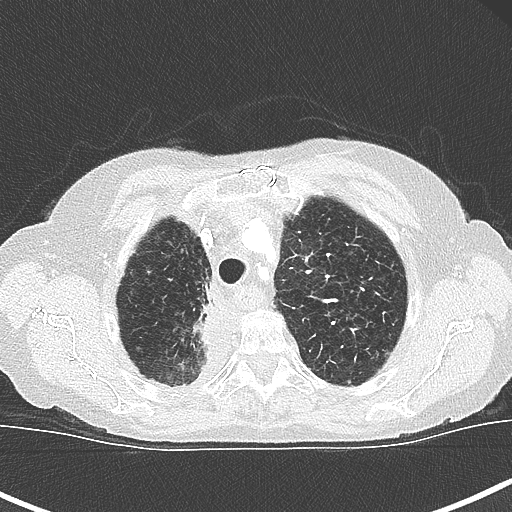
[im 131/150  mediastinal]
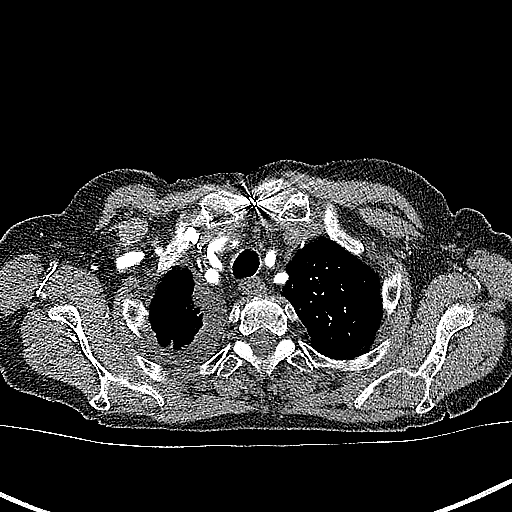
[im 140/150  lung]
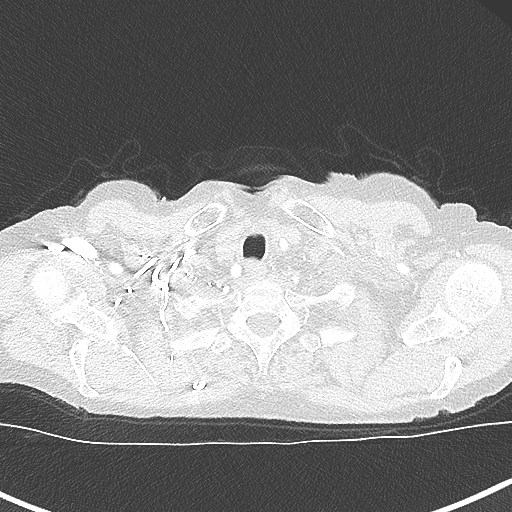

[Series 9: cor soft · coronal · 0.63mm/px · 1 of 133 slices shown]
[im 67/133  mediastinal]
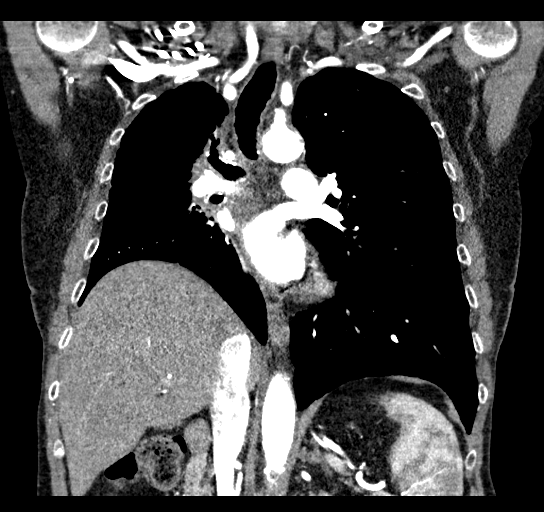

[16 of 36 positions shown; findings below may reference images not displayed]

FINDINGS: Vascular Findings:

Stable fusiform aneurysmal dilatation of the ascending thoracic
aorta with measurements as follows.

The thoracic aorta tapers to a normal caliber at the level of the
aortic arch. No evidence of thoracic aortic dissection or periaortic
stranding on this non gated examination.

Post median sternotomy and aortic valve replacement with felt
pledgets noted about the proximal ascending thoracic aorta.

Bovine configuration of the aortic arch. The branch vessels of the
aortic arch are diseased at the at their origins and tortuous though
patent without a hemodynamically significant narrowing.

Normal heart size. Coronary artery calcifications. No pericardial
effusion.

Although this examination was not tailored for the evaluation the
pulmonary arteries, there are no discrete filling defects within the
central pulmonary arterial tree to suggest central pulmonary
embolism. Normal caliber of the main pulmonary artery.

-------------------------------------------------------------

Thoracic aortic measurements:

Sinotubular junction

31 mm as measured in greatest oblique short axis coronal dimension.

Proximal ascending aorta

42 mm as measured in greatest oblique short axis axial dimension at
the level of the main pulmonary artery (axial image 36, series 5),
approximately 42 mm in greatest oblique short axis sagittal diameter
(image 85, series 10) and approximately 43 mm in greatest oblique
short axis coronal diameter (coronal image 46, series 9), grossly
unchanged compared to the [DATE] examination.

Aortic arch aorta

25 mm as measured in greatest oblique short axis sagittal dimension.

Proximal descending thoracic aorta

25 mm as measured in greatest oblique short axis axial dimension at
the level of the main pulmonary artery.

Distal descending thoracic aorta

22 mm as measured in greatest oblique short axis axial dimension at
the level of the diaphragmatic hiatus.

Review of the MIP images confirms the above findings.

-------------------------------------------------------------

Non-Vascular Findings:

Mediastinum/Lymph Nodes: Redemonstrated thickening along the right
mainstem bronchus without definitive associated adenopathy. No
definitive bulky mediastinal, hilar or axillary lymphadenopathy on
this arterial phase examination.

Lungs/Pleura: While difficult to objectively measure, there appears
to be more mass like thickening along the right hilum
(representative images 25, 43 and 51), with index component
involving the superomedial aspect of the right upper lobe now
measuring approximately 3.3 x 2.0 cm (coronal image 90, series 9),
previously, 2.6 x 1.3 cm.

Redemonstrated advanced emphysematous change. Redemonstrated nodular
thickening within the right costophrenic angle (image 87, series 6).
Scattered ill-defined ground-glass centrilobular nodules within
right upper lobe are unchanged. No new focal airspace opacities. No
pleural effusion or pneumothorax. Redemonstrated architectural
distortion involving bronchi of the right hilum no wall several
bronchi are narrowed the majority appear patent.

Upper abdomen: Limited early arterial phase evaluation of the upper
abdomen is unremarkable.

Musculoskeletal: No acute or aggressive osseous abnormalities.
Stable sequela of T5 and T6 vertebral body cement augmentation.
Remaining thoracic vertebral body heights appear preserved. Regional
soft tissues appear normal. The thyroid gland was not imaged.
IMPRESSION: 1. Worsening masslike thickening about the right hilum, potentially
the sequela of evolving radiation change though progressive disease
is not excluded on the basis of this examination. Further evaluation
with PET-CT could be performed as indicated.
2. Stable uncomplicated fusiform aneurysmal dilatation of the
ascending thoracic aorta measuring 43 mm in maximal diameter,
grossly unchanged compared to the [DATE] examination. Recommend
annual imaging followup by CTA or MRA. This recommendation follows
9868 ACCF/AHA/AATS/ACR/ASA/SCA/TIGER/CUJO/ODDASON/AGUS TN Guidelines for the
Diagnosis and Management of Patients with Thoracic Aortic Disease.
Circulation. 9868; 121: E266-e369. Aortic aneurysm NOS (D2UGO-EKE.N)
3. Stable sequela of previous open aortic valve repair.
4. Coronary calcifications.  Aortic Atherosclerosis (D2UGO-ZCJ.J).
5.  Emphysema (D2UGO-83V.5).

## 2021-06-17 DIAGNOSIS — I712 Thoracic aortic aneurysm, without rupture, unspecified: Secondary | ICD-10-CM | POA: Diagnosis not present

## 2021-06-17 DIAGNOSIS — Z5112 Encounter for antineoplastic immunotherapy: Secondary | ICD-10-CM | POA: Diagnosis not present

## 2021-06-17 DIAGNOSIS — R079 Chest pain, unspecified: Secondary | ICD-10-CM | POA: Diagnosis not present

## 2021-06-17 DIAGNOSIS — R0600 Dyspnea, unspecified: Secondary | ICD-10-CM | POA: Diagnosis not present

## 2021-06-17 DIAGNOSIS — I10 Essential (primary) hypertension: Secondary | ICD-10-CM | POA: Diagnosis not present

## 2021-06-17 DIAGNOSIS — C3401 Malignant neoplasm of right main bronchus: Secondary | ICD-10-CM | POA: Diagnosis not present

## 2021-06-18 LAB — BASIC METABOLIC PANEL
BUN/Creatinine Ratio: 7 — ABNORMAL LOW (ref 12–28)
BUN: 6 mg/dL — ABNORMAL LOW (ref 8–27)
CO2: 23 mmol/L (ref 20–29)
Calcium: 9.8 mg/dL (ref 8.7–10.3)
Chloride: 101 mmol/L (ref 96–106)
Creatinine, Ser: 0.86 mg/dL (ref 0.57–1.00)
Glucose: 97 mg/dL (ref 70–99)
Potassium: 4 mmol/L (ref 3.5–5.2)
Sodium: 143 mmol/L (ref 134–144)
eGFR: 74 mL/min/{1.73_m2} (ref >59)

## 2021-06-19 ENCOUNTER — Ambulatory Visit (INDEPENDENT_AMBULATORY_CARE_PROVIDER_SITE_OTHER): Payer: Medicare Other | Admitting: *Deleted

## 2021-06-19 ENCOUNTER — Telehealth: Payer: Self-pay | Admitting: *Deleted

## 2021-06-19 DIAGNOSIS — Z5181 Encounter for therapeutic drug level monitoring: Secondary | ICD-10-CM | POA: Diagnosis not present

## 2021-06-19 DIAGNOSIS — Z952 Presence of prosthetic heart valve: Secondary | ICD-10-CM

## 2021-06-19 LAB — POCT INR: INR: 1.9 — AB (ref 2.0–3.0)

## 2021-06-19 NOTE — Telephone Encounter (Signed)
Patient informed. Copy sent to PCP °

## 2021-06-19 NOTE — Telephone Encounter (Signed)
-----   Message from Satira Sark, MD sent at 06/18/2021  7:59 AM EST ----- Lab work ordered by Mr. Leonides Sake NP in anticipation of a follow-up chest CTA.  Renal function is normal.  Per most recent office note it looks like follow-up is arranged with Dr. Harl Bowie, would please verify.

## 2021-06-19 NOTE — Patient Instructions (Signed)
Continue warfarin 1 tablet daily. Had infusion 06/17/21 Recheck on 12/7 Pending endoscopy on 07/16/20.  Pt prefers to continue doing Lovenox bridge as recommended by MD at [facility]

## 2021-06-21 DIAGNOSIS — E538 Deficiency of other specified B group vitamins: Secondary | ICD-10-CM | POA: Diagnosis not present

## 2021-06-21 DIAGNOSIS — F17210 Nicotine dependence, cigarettes, uncomplicated: Secondary | ICD-10-CM | POA: Diagnosis not present

## 2021-06-24 ENCOUNTER — Other Ambulatory Visit: Payer: Self-pay

## 2021-06-24 ENCOUNTER — Ambulatory Visit (HOSPITAL_COMMUNITY)
Admission: RE | Admit: 2021-06-24 | Discharge: 2021-06-24 | Disposition: A | Discharge status: Home / Self Care | Payer: Medicare Other | Source: Ambulatory Visit | Attending: Family Medicine | Admitting: Family Medicine

## 2021-06-24 DIAGNOSIS — I7121 Aneurysm of the ascending aorta, without rupture: Secondary | ICD-10-CM | POA: Insufficient documentation

## 2021-06-24 DIAGNOSIS — J90 Pleural effusion, not elsewhere classified: Secondary | ICD-10-CM | POA: Diagnosis not present

## 2021-06-24 DIAGNOSIS — I712 Thoracic aortic aneurysm, without rupture, unspecified: Secondary | ICD-10-CM | POA: Diagnosis not present

## 2021-06-24 MED ORDER — IOHEXOL 350 MG/ML SOLN
75.0000 mL | Freq: Once | INTRAVENOUS | Status: AC | PRN
  Administered 2021-06-24: 75 mL via INTRAVENOUS

## 2021-06-27 ENCOUNTER — Ambulatory Visit: Payer: Medicare Other | Admitting: Family Medicine

## 2021-06-28 DIAGNOSIS — Z7901 Long term (current) use of anticoagulants: Secondary | ICD-10-CM | POA: Diagnosis not present

## 2021-06-28 DIAGNOSIS — I2510 Atherosclerotic heart disease of native coronary artery without angina pectoris: Secondary | ICD-10-CM | POA: Diagnosis not present

## 2021-06-28 DIAGNOSIS — E039 Hypothyroidism, unspecified: Secondary | ICD-10-CM | POA: Diagnosis not present

## 2021-06-28 DIAGNOSIS — Z79899 Other long term (current) drug therapy: Secondary | ICD-10-CM | POA: Diagnosis not present

## 2021-06-28 DIAGNOSIS — R590 Localized enlarged lymph nodes: Secondary | ICD-10-CM | POA: Diagnosis not present

## 2021-06-28 DIAGNOSIS — Z681 Body mass index (BMI) 19 or less, adult: Secondary | ICD-10-CM | POA: Diagnosis not present

## 2021-06-28 DIAGNOSIS — I10 Essential (primary) hypertension: Secondary | ICD-10-CM | POA: Diagnosis not present

## 2021-06-28 DIAGNOSIS — Z7982 Long term (current) use of aspirin: Secondary | ICD-10-CM | POA: Diagnosis not present

## 2021-06-28 DIAGNOSIS — Z09 Encounter for follow-up examination after completed treatment for conditions other than malignant neoplasm: Secondary | ICD-10-CM | POA: Diagnosis not present

## 2021-06-28 DIAGNOSIS — C3401 Malignant neoplasm of right main bronchus: Secondary | ICD-10-CM | POA: Diagnosis not present

## 2021-06-28 DIAGNOSIS — Z79890 Hormone replacement therapy (postmenopausal): Secondary | ICD-10-CM | POA: Diagnosis not present

## 2021-06-28 DIAGNOSIS — J439 Emphysema, unspecified: Secondary | ICD-10-CM | POA: Diagnosis not present

## 2021-07-01 ENCOUNTER — Ambulatory Visit (INDEPENDENT_AMBULATORY_CARE_PROVIDER_SITE_OTHER): Payer: Medicare Other | Admitting: *Deleted

## 2021-07-01 DIAGNOSIS — C3490 Malignant neoplasm of unspecified part of unspecified bronchus or lung: Secondary | ICD-10-CM | POA: Diagnosis not present

## 2021-07-01 DIAGNOSIS — C3401 Malignant neoplasm of right main bronchus: Secondary | ICD-10-CM | POA: Diagnosis not present

## 2021-07-01 DIAGNOSIS — Z5181 Encounter for therapeutic drug level monitoring: Secondary | ICD-10-CM

## 2021-07-01 DIAGNOSIS — Z952 Presence of prosthetic heart valve: Secondary | ICD-10-CM

## 2021-07-01 DIAGNOSIS — C7880 Secondary malignant neoplasm of unspecified digestive organ: Secondary | ICD-10-CM | POA: Diagnosis not present

## 2021-07-01 LAB — POCT INR: INR: 6.2 — AB (ref 2.0–3.0)

## 2021-07-01 NOTE — Patient Instructions (Signed)
Hold warfarin x 3 days then decrease dose to 1 tablet daily except 1/2 tablet on Mondays and Fridays. Has infusion 07/02/21 Recheck on 12/27 Pending endoscopy on 07/16/20.  Pt prefers to continue doing Lovenox bridge as recommended by MD at [facility] Bleeding and fall precautions discussed with pt and she verbalized understanding. No s/s of bleeding at this point

## 2021-07-02 DIAGNOSIS — C3401 Malignant neoplasm of right main bronchus: Secondary | ICD-10-CM | POA: Diagnosis not present

## 2021-07-02 DIAGNOSIS — Z5112 Encounter for antineoplastic immunotherapy: Secondary | ICD-10-CM | POA: Diagnosis not present

## 2021-07-02 DIAGNOSIS — J449 Chronic obstructive pulmonary disease, unspecified: Secondary | ICD-10-CM | POA: Diagnosis not present

## 2021-07-02 DIAGNOSIS — C3490 Malignant neoplasm of unspecified part of unspecified bronchus or lung: Secondary | ICD-10-CM | POA: Diagnosis not present

## 2021-07-02 DIAGNOSIS — Z20828 Contact with and (suspected) exposure to other viral communicable diseases: Secondary | ICD-10-CM | POA: Diagnosis not present

## 2021-07-02 DIAGNOSIS — F17210 Nicotine dependence, cigarettes, uncomplicated: Secondary | ICD-10-CM | POA: Diagnosis not present

## 2021-07-02 DIAGNOSIS — J0101 Acute recurrent maxillary sinusitis: Secondary | ICD-10-CM | POA: Diagnosis not present

## 2021-07-02 DIAGNOSIS — R634 Abnormal weight loss: Secondary | ICD-10-CM | POA: Diagnosis not present

## 2021-07-02 DIAGNOSIS — J9859 Other diseases of mediastinum, not elsewhere classified: Secondary | ICD-10-CM | POA: Diagnosis not present

## 2021-07-03 ENCOUNTER — Telehealth: Payer: Self-pay | Admitting: *Deleted

## 2021-07-03 NOTE — Telephone Encounter (Signed)
-----   Message from Satira Sark, MD sent at 06/30/2021  9:05 AM EST ----- Chest CTA ordered by Mr. Leonides Sake NP with chart indicating plan to follow-up with Dr. Harl Bowie.  Study shows stable ascending thoracic aortic aneurysm at 43 mm, post radiation changes of the right hilum with no obvious disease recurrence and also evidence of SMA atherosclerosis in similar range to assessment in November 2021.  Will forward to Dr. Harl Bowie for his review and management.

## 2021-07-09 ENCOUNTER — Ambulatory Visit (INDEPENDENT_AMBULATORY_CARE_PROVIDER_SITE_OTHER): Payer: Medicare Other | Admitting: *Deleted

## 2021-07-09 ENCOUNTER — Other Ambulatory Visit: Payer: Self-pay

## 2021-07-09 DIAGNOSIS — Z952 Presence of prosthetic heart valve: Secondary | ICD-10-CM

## 2021-07-09 DIAGNOSIS — F17210 Nicotine dependence, cigarettes, uncomplicated: Secondary | ICD-10-CM | POA: Diagnosis not present

## 2021-07-09 DIAGNOSIS — J209 Acute bronchitis, unspecified: Secondary | ICD-10-CM | POA: Diagnosis not present

## 2021-07-09 DIAGNOSIS — Z5181 Encounter for therapeutic drug level monitoring: Secondary | ICD-10-CM | POA: Diagnosis not present

## 2021-07-09 DIAGNOSIS — C3490 Malignant neoplasm of unspecified part of unspecified bronchus or lung: Secondary | ICD-10-CM | POA: Diagnosis not present

## 2021-07-09 LAB — POCT INR: INR: 5.8 — AB (ref 2.0–3.0)

## 2021-07-09 MED ORDER — ENOXAPARIN SODIUM 40 MG/0.4ML IJ SOSY: 40.0000 mg | PREFILLED_SYRINGE | Freq: Two times a day (BID) | INTRAMUSCULAR | 1 refills | Status: DC

## 2021-07-09 NOTE — Patient Instructions (Addendum)
Upper Endoscopy per [facility] on 07/16/21 Hold warfarin 5 days before procedure and bridge with Lovenox 40mg  twice daily  Labs:  06/28/21  SCr 1.06  CrCl 38.78  Hgb 14.4  Hct 43.9  Plts 389  Wt 47.7kg  12/27 - 12/29  No Warfarin 12/30 - 1/1  Lovenox 40mg  twice daily 1/2  No Lovenox 1/3  No Lovenox in am----------procedure---  Warfarin 3mg  pm 1/4 - Lovenox 40mg  am & pm and warfarin 3mg  pm 1/5 - 1/8  Lovenox 40mg  am & pm and warfarin 2mg  pm 1/9  Lovenox 40mg  am -----INR appt 3:45pm

## 2021-07-12 DIAGNOSIS — J449 Chronic obstructive pulmonary disease, unspecified: Secondary | ICD-10-CM | POA: Diagnosis not present

## 2021-07-12 DIAGNOSIS — K2100 Gastro-esophageal reflux disease with esophagitis, without bleeding: Secondary | ICD-10-CM | POA: Diagnosis not present

## 2021-07-12 DIAGNOSIS — R600 Localized edema: Secondary | ICD-10-CM | POA: Diagnosis not present

## 2021-07-16 DIAGNOSIS — I10 Essential (primary) hypertension: Secondary | ICD-10-CM | POA: Diagnosis not present

## 2021-07-16 DIAGNOSIS — D649 Anemia, unspecified: Secondary | ICD-10-CM | POA: Diagnosis not present

## 2021-07-16 DIAGNOSIS — K222 Esophageal obstruction: Secondary | ICD-10-CM | POA: Diagnosis not present

## 2021-07-16 DIAGNOSIS — R1310 Dysphagia, unspecified: Secondary | ICD-10-CM | POA: Diagnosis not present

## 2021-07-16 DIAGNOSIS — I2510 Atherosclerotic heart disease of native coronary artery without angina pectoris: Secondary | ICD-10-CM | POA: Diagnosis not present

## 2021-07-16 DIAGNOSIS — K449 Diaphragmatic hernia without obstruction or gangrene: Secondary | ICD-10-CM | POA: Diagnosis not present

## 2021-07-16 DIAGNOSIS — Z7901 Long term (current) use of anticoagulants: Secondary | ICD-10-CM | POA: Diagnosis not present

## 2021-07-17 NOTE — Telephone Encounter (Signed)
Laurine Blazer, LPN  08/20/6182  8:59 PM EST     Notified, copy to pcp.

## 2021-07-18 DIAGNOSIS — M25551 Pain in right hip: Secondary | ICD-10-CM | POA: Diagnosis not present

## 2021-07-18 DIAGNOSIS — Z681 Body mass index (BMI) 19 or less, adult: Secondary | ICD-10-CM | POA: Diagnosis not present

## 2021-07-18 DIAGNOSIS — S7011XA Contusion of right thigh, initial encounter: Secondary | ICD-10-CM | POA: Diagnosis not present

## 2021-07-18 DIAGNOSIS — F17210 Nicotine dependence, cigarettes, uncomplicated: Secondary | ICD-10-CM | POA: Diagnosis not present

## 2021-07-19 ENCOUNTER — Ambulatory Visit (INDEPENDENT_AMBULATORY_CARE_PROVIDER_SITE_OTHER): Payer: Medicare Other | Admitting: Internal Medicine

## 2021-07-19 ENCOUNTER — Telehealth: Payer: Self-pay | Admitting: Family Medicine

## 2021-07-19 ENCOUNTER — Other Ambulatory Visit: Payer: Self-pay

## 2021-07-19 ENCOUNTER — Encounter: Payer: Self-pay | Admitting: Internal Medicine

## 2021-07-19 VITALS — BP 117/81 | HR 74 | Ht 63.0 in | Wt 104.0 lb

## 2021-07-19 DIAGNOSIS — Z952 Presence of prosthetic heart valve: Secondary | ICD-10-CM

## 2021-07-19 DIAGNOSIS — Z09 Encounter for follow-up examination after completed treatment for conditions other than malignant neoplasm: Secondary | ICD-10-CM | POA: Diagnosis not present

## 2021-07-19 DIAGNOSIS — E039 Hypothyroidism, unspecified: Secondary | ICD-10-CM | POA: Diagnosis not present

## 2021-07-19 DIAGNOSIS — C3401 Malignant neoplasm of right main bronchus: Secondary | ICD-10-CM | POA: Diagnosis not present

## 2021-07-19 DIAGNOSIS — I4891 Unspecified atrial fibrillation: Secondary | ICD-10-CM | POA: Diagnosis not present

## 2021-07-19 DIAGNOSIS — D6869 Other thrombophilia: Secondary | ICD-10-CM

## 2021-07-19 DIAGNOSIS — Z5181 Encounter for therapeutic drug level monitoring: Secondary | ICD-10-CM | POA: Diagnosis not present

## 2021-07-19 DIAGNOSIS — S7011XA Contusion of right thigh, initial encounter: Secondary | ICD-10-CM

## 2021-07-19 DIAGNOSIS — F17210 Nicotine dependence, cigarettes, uncomplicated: Secondary | ICD-10-CM | POA: Diagnosis not present

## 2021-07-19 LAB — POCT INR: INR: 1.5 — AB (ref 2.0–3.0)

## 2021-07-19 MED ORDER — ENOXAPARIN SODIUM 40 MG/0.4ML IJ SOSY: 40.0000 mg | PREFILLED_SYRINGE | Freq: Two times a day (BID) | INTRAMUSCULAR | Status: DC

## 2021-07-19 NOTE — Patient Instructions (Addendum)
Medication Instructions:  Hold your Lovenox x 24 hours.  Continue your Warfarin as is.  Continue all other medications.     Labwork: none  Testing/Procedures: none  Follow-Up: Dr. Rayann Heman - [facility] location - 07/31/2021  Any Other Special Instructions Will Be Listed Below (If Applicable).   If you need a refill on your cardiac medications before your next appointment, please call your pharmacy.

## 2021-07-19 NOTE — Telephone Encounter (Signed)
Patient called to check on status of call.

## 2021-07-19 NOTE — Telephone Encounter (Signed)
Pt's INR was 1.5 checked at PCP today.   Dr. Rayann Heman was able to see pt today in office and gave her instructions. Please see office note from today (07/19/2021) for further documentation.

## 2021-07-19 NOTE — Patient Instructions (Addendum)
Description   Pt saw Dr. Rayann Heman today in office who gave her instructions concerning her Lovenox and warfarin- see office visit encounter for further documentation. Pt is coming in on 1/10 to have INR checked.

## 2021-07-19 NOTE — Progress Notes (Signed)
Electrophysiology Office Note   Date:  07/19/2021   ID:  Judy Sawyer, DOB 12-03-1955, MRN [id number]  PCP:  Judy Levy, PA  Cardiologist:  previously Dr Wynetta Fines   CC: thigh hematoma   History of Present Illness: Judy Sawyer is a 66 y.o. female who presents today for urgent evaluation of a thigh hematoma.   She has been on a lovenox bridge over the past week s/p endoscopy 07/16/21.  While at work a couple days ago, she was squatting and heart a "pop".  She then developed a large R thigh hematoma.  She feels that this has stabilized in size over the past 48 hours.  She has good ROM of her leg but discomfort at rest and with movement.   Past Medical History:  Diagnosis Date   Acute bronchitis    Anemia, unspecified    Aortic stenosis    previous cardiologisit: Dr. Remigio Sawyer in [place], [place] [facility])    Carotid stenosis 05/13   mild less than 50% bilaterally   Chest pain    Collagen vascular disease ([place])    COPD (chronic obstructive pulmonary disease) ([place])    Fibromyalgia    GERD (gastroesophageal reflux disease)    Hypertension    Murmur    Tobacco abuse    Past Surgical History:  Procedure Laterality Date   ABDOMINAL HYSTERECTOMY     AORTIC VALVE REPLACEMENT  11/2011   in [place]. [facility] 19 mm   COLONOSCOPY     KNEE ARTHROSCOPY WITH LATERAL MENISECTOMY Left 08/16/2014   Procedure: LEFT KNEE ARTHROSCOPY WITH LATERAL MENISECTOMY;  Surgeon: Lorn Junes, MD;  Location: [facility];  Service: Orthopedics;  Laterality: Left;   KNEE ARTHROSCOPY WITH MEDIAL MENISECTOMY Left 08/16/2014   Procedure: KNEE ARTHROSCOPY WITH MEDIAL MENISECTOMY;  Surgeon: Lorn Junes, MD;  Location: [facility];  Service: Orthopedics;  Laterality: Left;   TONSILLECTOMY     UPPER GI ENDOSCOPY       Current Outpatient Medications  Medication Sig Dispense Refill   aspirin EC 81 MG tablet Take 81 mg by mouth daily.     calcium  carbonate (OSCAL) 1500 (600 Ca) MG TABS tablet Take 1 tablet by mouth daily with breakfast.     Cholecalciferol (VITAMIN D3) 1000 units CAPS Take 1 capsule by mouth daily.     enoxaparin (LOVENOX) 40 MG/0.4ML injection Inject 0.4 mLs (40 mg total) into the skin every 12 (twelve) hours. Total of 10 syringes 4 mL 1   EUTHYROX 125 MCG tablet Take 125 mcg by mouth every morning.     fluticasone (FLONASE) 50 MCG/ACT nasal spray Place into both nostrils as needed for allergies or rhinitis.     HYDROcodone-acetaminophen (NORCO) 7.5-325 MG tablet Take 1 tablet by mouth daily.     ipilimumab (YERVOY) 200 MG/40ML SOLN Inject into the vein.     levothyroxine (SYNTHROID) 25 MCG tablet Take 1 tablet by mouth in the morning and at bedtime.     Melatonin 5 MG TABS Take 1 tablet by mouth daily.     metoprolol tartrate (LOPRESSOR) 25 MG tablet Take 0.5 tablets (12.5 mg total) by mouth 2 (two) times daily. 90 tablet 1   NIVOLUMAB IV Inject into the vein.     pantoprazole (PROTONIX) 40 MG tablet Take 40 mg by mouth 2 (two) times daily.      prochlorperazine (COMPAZINE) 10 MG tablet Take 10 mg by mouth every 6 (six)  hours as needed for nausea or vomiting.     Sennosides 8.6 MG CAPS Take 1 capsule by mouth daily as needed.      warfarin (COUMADIN) 2 MG tablet Take 1 tablet by mouth daily or as directed by the coumadin clinic. 35 tablet 0   No current facility-administered medications for this visit.    Allergies:   Codeine   Social History:  The patient  reports that she quit smoking about 3 years ago. Her smoking use included cigarettes. She started smoking about 52 years ago. She has a 21.00 pack-year smoking history. She has never used smokeless tobacco. She reports that she does not drink alcohol and does not use drugs.   Family History:  The patient's  family history includes Cancer in her mother; Diabetes type II in her sister; Emphysema in her father; Heart attack in her mother; Heart disease in her sister;  Liver disease in her father; Rheumatic fever in her sister.    ROS:  Please see the history of present illness.   All other systems are personally reviewed and negative.    PHYSICAL EXAM: VS:  BP 117/81    Pulse 74    Ht 5\' 3"  (1.6 m)    Wt 104 lb (47.2 kg)    BMI 18.42 kg/m  , BMI Body mass index is 18.42 kg/m. GEN: thin, in no acute distress HEENT: normal Neck: no JVD  Cardiac: RRR , mechanical S2 Respiratory:  clear to auscultation bilaterally, normal work of breathing GI: soft, nontender, nondistended, + BS MS: large R thigh hematoma along the inner aspect of her R thigh.  Extending from below groin to the knee.  No bruits on R femoral exam.  2+ DP/PT puses Strength/ sensation are preserved  INR today is 1.5  ASSESSMENT AND PLAN:  1.  R thigh hematoma Traumatic in nature and in the setting of lovenox bridge. Hemodynamically stable.  Appears to be neurovascularly in tact We will hold lovenox for 24 hours.  Continue coumadin Return for INR check on Tuesday  I will reassess groin in 2 weeks.  She should report to the ER with any worsening of symptoms or increased size of hematoma.   2. Mechanical AV As above  Signed, Thompson Grayer, MD  07/19/2021 2:55 PM     [place]  [phone] (office) [phone] (fax)

## 2021-07-19 NOTE — Telephone Encounter (Signed)
New Message:     Patient says she is on Coumadin and have a big Hematoma on her right thigh. Swhe need to know what to do about the Coumadin and Levanox.

## 2021-07-19 NOTE — Telephone Encounter (Signed)
Called and spoke to pt.   Pt stated that on Monday 07/15/2021 she was at work and pulled a muscle in her leg. Pt stated that by Monday night  her whole leg was swollen, back and blue from her pelvic area all the way down.   Pt stated that she went to her PCP yesterday, Denny Levy, who stated that pt has a hematoma and needs to apply heat to it. Pt also said that PCP instructed for pt be out of work x1 week for the leg to rest. However, PCP never gave any recommendations on warfarin/Lovenox.   Pt has been off her warfarin/being bridged with Lovenox for an endoscopy on 1/3. (See bridge instructions from 12/27 anti-coag encounter for further documentation) Pt resumed warfarin on 1/3 and is now taking Lovenox and warfarin.    Pt stated that she would not be able to come to [facility] today to have INR checked because it is to far of a drive. Spoke to [place] from the [facility] office who stated that they would be able to check pt's INR today. Called and instructed pt to come today at 1:15 to have INR checked at [facility] office.

## 2021-07-20 NOTE — Progress Notes (Signed)
error 

## 2021-07-22 DIAGNOSIS — Z5112 Encounter for antineoplastic immunotherapy: Secondary | ICD-10-CM | POA: Diagnosis not present

## 2021-07-22 DIAGNOSIS — J9859 Other diseases of mediastinum, not elsewhere classified: Secondary | ICD-10-CM | POA: Diagnosis not present

## 2021-07-22 DIAGNOSIS — R634 Abnormal weight loss: Secondary | ICD-10-CM | POA: Diagnosis not present

## 2021-07-22 DIAGNOSIS — C3401 Malignant neoplasm of right main bronchus: Secondary | ICD-10-CM | POA: Diagnosis not present

## 2021-07-23 ENCOUNTER — Ambulatory Visit (INDEPENDENT_AMBULATORY_CARE_PROVIDER_SITE_OTHER): Payer: Medicare Other | Admitting: *Deleted

## 2021-07-23 DIAGNOSIS — Z952 Presence of prosthetic heart valve: Secondary | ICD-10-CM

## 2021-07-23 DIAGNOSIS — Z5181 Encounter for therapeutic drug level monitoring: Secondary | ICD-10-CM

## 2021-07-23 LAB — POCT INR: INR: 3 (ref 2.0–3.0)

## 2021-07-23 NOTE — Patient Instructions (Signed)
Start warfarin 1 tablet daily except 1/2 tablet on Tuesdays and Fridays Stop Lovenox Recheck in 1 week

## 2021-07-24 DIAGNOSIS — Z681 Body mass index (BMI) 19 or less, adult: Secondary | ICD-10-CM | POA: Diagnosis not present

## 2021-07-24 DIAGNOSIS — E538 Deficiency of other specified B group vitamins: Secondary | ICD-10-CM | POA: Diagnosis not present

## 2021-07-24 DIAGNOSIS — Z1331 Encounter for screening for depression: Secondary | ICD-10-CM | POA: Diagnosis not present

## 2021-07-24 DIAGNOSIS — Z1389 Encounter for screening for other disorder: Secondary | ICD-10-CM | POA: Diagnosis not present

## 2021-07-24 DIAGNOSIS — S7011XA Contusion of right thigh, initial encounter: Secondary | ICD-10-CM | POA: Diagnosis not present

## 2021-07-30 ENCOUNTER — Ambulatory Visit (INDEPENDENT_AMBULATORY_CARE_PROVIDER_SITE_OTHER): Payer: Medicare Other | Admitting: *Deleted

## 2021-07-30 DIAGNOSIS — Z952 Presence of prosthetic heart valve: Secondary | ICD-10-CM

## 2021-07-30 DIAGNOSIS — Z5181 Encounter for therapeutic drug level monitoring: Secondary | ICD-10-CM

## 2021-07-30 LAB — POCT INR: INR: 3.7 — AB (ref 2.0–3.0)

## 2021-07-30 MED ORDER — WARFARIN SODIUM 2 MG PO TABS: ORAL_TABLET | ORAL | 3 refills | Status: DC

## 2021-07-31 ENCOUNTER — Encounter (HOSPITAL_BASED_OUTPATIENT_CLINIC_OR_DEPARTMENT_OTHER): Payer: Self-pay | Admitting: Internal Medicine

## 2021-07-31 ENCOUNTER — Other Ambulatory Visit: Payer: Self-pay

## 2021-07-31 ENCOUNTER — Ambulatory Visit (INDEPENDENT_AMBULATORY_CARE_PROVIDER_SITE_OTHER): Payer: Medicare Other | Admitting: Internal Medicine

## 2021-07-31 VITALS — BP 130/72 | HR 79 | Ht 63.0 in | Wt 105.8 lb

## 2021-07-31 DIAGNOSIS — D6869 Other thrombophilia: Secondary | ICD-10-CM | POA: Diagnosis not present

## 2021-07-31 DIAGNOSIS — Z952 Presence of prosthetic heart valve: Secondary | ICD-10-CM | POA: Diagnosis not present

## 2021-07-31 DIAGNOSIS — S7011XD Contusion of right thigh, subsequent encounter: Secondary | ICD-10-CM

## 2021-07-31 DIAGNOSIS — S7011XA Contusion of right thigh, initial encounter: Secondary | ICD-10-CM

## 2021-07-31 NOTE — Patient Instructions (Addendum)
Medication Instructions:  Your physician recommends that you continue on your current medications as directed. Please refer to the Current Medication list given to you today. *If you need a refill on your cardiac medications before your next appointment, please call your pharmacy*  Lab Work: None ordered. If you have labs (blood work) drawn today and your tests are completely normal, you will receive your results only by: [place] (if you have MyChart) OR A paper copy in the mail If you have any lab test that is abnormal or we need to change your treatment, we will call you to review the results.  Testing/Procedures: None ordered.  Follow-Up: At [facility], you and your health needs are our priority.  As part of our continuing mission to provide you with exceptional heart care, we have created designated Provider Care Teams.  These Care Teams include your primary Cardiologist (physician) and Advanced Practice Providers (APPs -  Physician Assistants and Nurse Practitioners) who all work together to provide you with the care you need, when you need it.  Your next appointment:   Your physician wants you to follow-up as scheduled

## 2021-07-31 NOTE — Progress Notes (Signed)
PCP: Denny Levy, [place] Primary Cardiologist: previously Dr Bronson Ing and Katina Dung Primary EP: Dr Hardie Pulley Judy Sawyer is a 66 y.o. female who presents today for cardiology followup.  Her thigh hematoma is improved.  Denies any new concerns.  Denies numbness or weakness.  No pain.  Past Medical History:  Diagnosis Date   Acute bronchitis    Anemia, unspecified    Aortic stenosis    previous cardiologisit: Dr. Remigio Eisenmenger in [place], [place] [facility])    Carotid stenosis 05/13   mild less than 50% bilaterally   Chest pain    Collagen vascular disease ([place])    COPD (chronic obstructive pulmonary disease) ([place])    Fibromyalgia    GERD (gastroesophageal reflux disease)    Hypertension    Murmur    Tobacco abuse    Past Surgical History:  Procedure Laterality Date   ABDOMINAL HYSTERECTOMY     AORTIC VALVE REPLACEMENT  11/2011   in [place]. [facility] Jude 19 mm   COLONOSCOPY     KNEE ARTHROSCOPY WITH LATERAL MENISECTOMY Left 08/16/2014   Procedure: LEFT KNEE ARTHROSCOPY WITH LATERAL MENISECTOMY;  Surgeon: Lorn Junes, MD;  Location: [place];  Service: Orthopedics;  Laterality: Left;   KNEE ARTHROSCOPY WITH MEDIAL MENISECTOMY Left 08/16/2014   Procedure: KNEE ARTHROSCOPY WITH MEDIAL MENISECTOMY;  Surgeon: Lorn Junes, MD;  Location: [facility];  Service: Orthopedics;  Laterality: Left;   TONSILLECTOMY     UPPER GI ENDOSCOPY      ROS- all systems are reviewed and negatives except as per HPI above  Current Outpatient Medications  Medication Sig Dispense Refill   aspirin EC 81 MG tablet Take 81 mg by mouth daily.     calcium carbonate (OSCAL) 1500 (600 Ca) MG TABS tablet Take 1 tablet by mouth daily with breakfast.     Cholecalciferol (VITAMIN D3) 1000 units CAPS Take 1 capsule by mouth daily.     enoxaparin (LOVENOX) 40 MG/0.4ML injection Inject 0.4 mLs (40 mg total) into the skin every 12 (twelve) hours. Total of 10  syringes(HOLDING X 24 HOURS - 07/19/21)     EUTHYROX 125 MCG tablet Take 125 mcg by mouth every morning.     fluticasone (FLONASE) 50 MCG/ACT nasal spray Place into both nostrils as needed for allergies or rhinitis.     HYDROcodone-acetaminophen (NORCO) 7.5-325 MG tablet Take 1 tablet by mouth daily.     ipilimumab (YERVOY) 200 MG/40ML SOLN Inject into the vein.     levothyroxine (SYNTHROID) 25 MCG tablet Take 1 tablet by mouth in the morning and at bedtime.     Melatonin 5 MG TABS Take 1 tablet by mouth daily.     metoprolol tartrate (LOPRESSOR) 25 MG tablet Take 0.5 tablets (12.5 mg total) by mouth 2 (two) times daily. 90 tablet 1   NIVOLUMAB IV Inject into the vein.     pantoprazole (PROTONIX) 40 MG tablet Take 40 mg by mouth 2 (two) times daily.      prochlorperazine (COMPAZINE) 10 MG tablet Take 10 mg by mouth every 6 (six) hours as needed for nausea or vomiting.     Sennosides 8.6 MG CAPS Take 1 capsule by mouth daily as needed.      warfarin (COUMADIN) 2 MG tablet Take 1 tablet by mouth daily or as directed by the coumadin clinic. 35 tablet 3   No current facility-administered medications for this visit.    Physical Exam: Vitals:  07/31/21 1601  BP: 130/72  Pulse: 79  SpO2: 98%  Weight: 105 lb 12.8 oz (48 kg)  Height: 5\' 3"  (1.6 m)    GEN- The patient is thin appearing, alert and oriented x 3 today.   Head- normocephalic, atraumatic Eyes-  Sclera clear, conjunctiva pink Ears- hearing intact Oropharynx- clear Lungs-  normal work of breathing R thigh hematoma is improved.  There is very large ecchymosis along the medial aspect of the entire thigh  Wt Readings from Last 3 Encounters:  07/31/21 105 lb 12.8 oz (48 kg)  07/19/21 104 lb (47.2 kg)  05/20/21 103 lb (46.7 kg)     Assessment and Plan:  R thigh hematoma Appears to be improving INR is therapeutic and she is no longer on lovenox Coumadin is followed with the coumadin clinic  2. Mechanical AV She will  re-establish with cardiology team in [place].  I will see as needed  Thompson Grayer MD, [facility] 07/31/2021 4:18 PM

## 2021-08-02 DIAGNOSIS — J9859 Other diseases of mediastinum, not elsewhere classified: Secondary | ICD-10-CM | POA: Diagnosis not present

## 2021-08-02 DIAGNOSIS — I7121 Aneurysm of the ascending aorta, without rupture: Secondary | ICD-10-CM | POA: Diagnosis not present

## 2021-08-02 DIAGNOSIS — C3401 Malignant neoplasm of right main bronchus: Secondary | ICD-10-CM | POA: Diagnosis not present

## 2021-08-02 DIAGNOSIS — Z09 Encounter for follow-up examination after completed treatment for conditions other than malignant neoplasm: Secondary | ICD-10-CM | POA: Diagnosis not present

## 2021-08-02 DIAGNOSIS — R634 Abnormal weight loss: Secondary | ICD-10-CM | POA: Diagnosis not present

## 2021-08-02 DIAGNOSIS — C7880 Secondary malignant neoplasm of unspecified digestive organ: Secondary | ICD-10-CM | POA: Diagnosis not present

## 2021-08-05 DIAGNOSIS — Z5112 Encounter for antineoplastic immunotherapy: Secondary | ICD-10-CM | POA: Diagnosis not present

## 2021-08-05 DIAGNOSIS — J9859 Other diseases of mediastinum, not elsewhere classified: Secondary | ICD-10-CM | POA: Diagnosis not present

## 2021-08-05 DIAGNOSIS — C3401 Malignant neoplasm of right main bronchus: Secondary | ICD-10-CM | POA: Diagnosis not present

## 2021-08-05 DIAGNOSIS — R634 Abnormal weight loss: Secondary | ICD-10-CM | POA: Diagnosis not present

## 2021-08-06 ENCOUNTER — Other Ambulatory Visit: Payer: Self-pay

## 2021-08-06 ENCOUNTER — Ambulatory Visit (INDEPENDENT_AMBULATORY_CARE_PROVIDER_SITE_OTHER): Payer: Medicare Other | Admitting: *Deleted

## 2021-08-06 DIAGNOSIS — Z952 Presence of prosthetic heart valve: Secondary | ICD-10-CM | POA: Diagnosis not present

## 2021-08-06 DIAGNOSIS — Z5181 Encounter for therapeutic drug level monitoring: Secondary | ICD-10-CM

## 2021-08-06 LAB — POCT INR: INR: 2.5 (ref 2.0–3.0)

## 2021-08-06 NOTE — Patient Instructions (Signed)
Continue warfarin 1/2 tablet daily except 1 tablet on Sundays, Tuesdays and Thursdays Recheck in 2 weeks

## 2021-08-12 DIAGNOSIS — Z681 Body mass index (BMI) 19 or less, adult: Secondary | ICD-10-CM | POA: Diagnosis not present

## 2021-08-12 DIAGNOSIS — J40 Bronchitis, not specified as acute or chronic: Secondary | ICD-10-CM | POA: Diagnosis not present

## 2021-08-12 DIAGNOSIS — C3490 Malignant neoplasm of unspecified part of unspecified bronchus or lung: Secondary | ICD-10-CM | POA: Diagnosis not present

## 2021-08-12 DIAGNOSIS — F17210 Nicotine dependence, cigarettes, uncomplicated: Secondary | ICD-10-CM | POA: Diagnosis not present

## 2021-08-12 DIAGNOSIS — J019 Acute sinusitis, unspecified: Secondary | ICD-10-CM | POA: Diagnosis not present

## 2021-08-15 DIAGNOSIS — S76209A Unspecified injury of adductor muscle, fascia and tendon of unspecified thigh, initial encounter: Secondary | ICD-10-CM | POA: Diagnosis not present

## 2021-08-15 DIAGNOSIS — F17210 Nicotine dependence, cigarettes, uncomplicated: Secondary | ICD-10-CM | POA: Diagnosis not present

## 2021-08-15 DIAGNOSIS — M79604 Pain in right leg: Secondary | ICD-10-CM | POA: Diagnosis not present

## 2021-08-15 DIAGNOSIS — Z681 Body mass index (BMI) 19 or less, adult: Secondary | ICD-10-CM | POA: Diagnosis not present

## 2021-08-16 DIAGNOSIS — J9859 Other diseases of mediastinum, not elsewhere classified: Secondary | ICD-10-CM | POA: Diagnosis not present

## 2021-08-16 DIAGNOSIS — C3401 Malignant neoplasm of right main bronchus: Secondary | ICD-10-CM | POA: Diagnosis not present

## 2021-08-16 DIAGNOSIS — R634 Abnormal weight loss: Secondary | ICD-10-CM | POA: Diagnosis not present

## 2021-08-16 DIAGNOSIS — E039 Hypothyroidism, unspecified: Secondary | ICD-10-CM | POA: Diagnosis not present

## 2021-08-16 DIAGNOSIS — Z09 Encounter for follow-up examination after completed treatment for conditions other than malignant neoplasm: Secondary | ICD-10-CM | POA: Diagnosis not present

## 2021-08-16 DIAGNOSIS — Z681 Body mass index (BMI) 19 or less, adult: Secondary | ICD-10-CM | POA: Diagnosis not present

## 2021-08-19 DIAGNOSIS — E538 Deficiency of other specified B group vitamins: Secondary | ICD-10-CM | POA: Diagnosis not present

## 2021-08-20 DIAGNOSIS — Z5112 Encounter for antineoplastic immunotherapy: Secondary | ICD-10-CM | POA: Diagnosis not present

## 2021-08-20 DIAGNOSIS — Z95828 Presence of other vascular implants and grafts: Secondary | ICD-10-CM | POA: Diagnosis not present

## 2021-08-20 DIAGNOSIS — J9859 Other diseases of mediastinum, not elsewhere classified: Secondary | ICD-10-CM | POA: Diagnosis not present

## 2021-08-20 DIAGNOSIS — R634 Abnormal weight loss: Secondary | ICD-10-CM | POA: Diagnosis not present

## 2021-08-20 DIAGNOSIS — C3401 Malignant neoplasm of right main bronchus: Secondary | ICD-10-CM | POA: Diagnosis not present

## 2021-08-21 ENCOUNTER — Ambulatory Visit (INDEPENDENT_AMBULATORY_CARE_PROVIDER_SITE_OTHER): Payer: Medicare Other | Admitting: *Deleted

## 2021-08-21 ENCOUNTER — Other Ambulatory Visit: Payer: Self-pay

## 2021-08-21 DIAGNOSIS — Z952 Presence of prosthetic heart valve: Secondary | ICD-10-CM

## 2021-08-21 DIAGNOSIS — Z5181 Encounter for therapeutic drug level monitoring: Secondary | ICD-10-CM | POA: Diagnosis not present

## 2021-08-21 LAB — POCT INR: INR: 2.2 (ref 2.0–3.0)

## 2021-08-21 NOTE — Patient Instructions (Signed)
Continue warfarin 1/2 tablet daily except 1 tablet on Sundays, Tuesdays and Thursdays Recheck in 2 weeks

## 2021-08-22 ENCOUNTER — Telehealth: Payer: Self-pay

## 2021-08-22 NOTE — Telephone Encounter (Signed)
Received refill request for Metoprolol tartrate 25 mg.  Patient has only been seen by Katina Dung and SK.

## 2021-08-26 ENCOUNTER — Telehealth: Payer: Self-pay

## 2021-08-26 NOTE — Telephone Encounter (Signed)
Former Bronson Ing, MD/Quinn, NP patient   Has apt 11/18/21 with [facility] requesting refill metoprolol tartrate 25 mg ,sig: 1/2 tablet by mouth twice daily, #90    Will forward to B.Ahmed Prima, PA-C for review.

## 2021-08-27 MED ORDER — METOPROLOL TARTRATE 25 MG PO TABS: 12.5000 mg | ORAL_TABLET | Freq: Two times a day (BID) | ORAL | 1 refills | Status: DC

## 2021-08-27 NOTE — Telephone Encounter (Signed)
Complete

## 2021-08-28 DIAGNOSIS — C719 Malignant neoplasm of brain, unspecified: Secondary | ICD-10-CM | POA: Diagnosis not present

## 2021-08-28 DIAGNOSIS — C3401 Malignant neoplasm of right main bronchus: Secondary | ICD-10-CM | POA: Diagnosis not present

## 2021-08-28 DIAGNOSIS — C3490 Malignant neoplasm of unspecified part of unspecified bronchus or lung: Secondary | ICD-10-CM | POA: Diagnosis not present

## 2021-08-28 DIAGNOSIS — I614 Nontraumatic intracerebral hemorrhage in cerebellum: Secondary | ICD-10-CM | POA: Diagnosis not present

## 2021-08-30 DIAGNOSIS — R059 Cough, unspecified: Secondary | ICD-10-CM | POA: Diagnosis not present

## 2021-08-30 DIAGNOSIS — J9859 Other diseases of mediastinum, not elsewhere classified: Secondary | ICD-10-CM | POA: Diagnosis not present

## 2021-08-30 DIAGNOSIS — F17210 Nicotine dependence, cigarettes, uncomplicated: Secondary | ICD-10-CM | POA: Diagnosis not present

## 2021-08-30 DIAGNOSIS — K219 Gastro-esophageal reflux disease without esophagitis: Secondary | ICD-10-CM | POA: Diagnosis not present

## 2021-08-30 DIAGNOSIS — Z681 Body mass index (BMI) 19 or less, adult: Secondary | ICD-10-CM | POA: Diagnosis not present

## 2021-08-30 DIAGNOSIS — C3401 Malignant neoplasm of right main bronchus: Secondary | ICD-10-CM | POA: Diagnosis not present

## 2021-08-30 DIAGNOSIS — R634 Abnormal weight loss: Secondary | ICD-10-CM | POA: Diagnosis not present

## 2021-09-03 DIAGNOSIS — J9859 Other diseases of mediastinum, not elsewhere classified: Secondary | ICD-10-CM | POA: Diagnosis not present

## 2021-09-03 DIAGNOSIS — R634 Abnormal weight loss: Secondary | ICD-10-CM | POA: Diagnosis not present

## 2021-09-03 DIAGNOSIS — C3401 Malignant neoplasm of right main bronchus: Secondary | ICD-10-CM | POA: Diagnosis not present

## 2021-09-03 DIAGNOSIS — Z5112 Encounter for antineoplastic immunotherapy: Secondary | ICD-10-CM | POA: Diagnosis not present

## 2021-09-04 ENCOUNTER — Ambulatory Visit (INDEPENDENT_AMBULATORY_CARE_PROVIDER_SITE_OTHER): Payer: Medicare Other | Admitting: *Deleted

## 2021-09-04 DIAGNOSIS — Z5181 Encounter for therapeutic drug level monitoring: Secondary | ICD-10-CM | POA: Diagnosis not present

## 2021-09-04 DIAGNOSIS — Z952 Presence of prosthetic heart valve: Secondary | ICD-10-CM

## 2021-09-04 LAB — POCT INR: INR: 2.3 (ref 2.0–3.0)

## 2021-09-04 NOTE — Patient Instructions (Signed)
Continue warfarin 1/2 tablet daily except 1 tablet on Sundays, Tuesdays and Thursdays Recheck in 3 weeks

## 2021-09-13 DIAGNOSIS — J9859 Other diseases of mediastinum, not elsewhere classified: Secondary | ICD-10-CM | POA: Diagnosis not present

## 2021-09-13 DIAGNOSIS — R634 Abnormal weight loss: Secondary | ICD-10-CM | POA: Diagnosis not present

## 2021-09-13 DIAGNOSIS — C3401 Malignant neoplasm of right main bronchus: Secondary | ICD-10-CM | POA: Diagnosis not present

## 2021-09-13 DIAGNOSIS — E039 Hypothyroidism, unspecified: Secondary | ICD-10-CM | POA: Diagnosis not present

## 2021-09-13 DIAGNOSIS — Z09 Encounter for follow-up examination after completed treatment for conditions other than malignant neoplasm: Secondary | ICD-10-CM | POA: Diagnosis not present

## 2021-09-13 DIAGNOSIS — Z681 Body mass index (BMI) 19 or less, adult: Secondary | ICD-10-CM | POA: Diagnosis not present

## 2021-09-16 DIAGNOSIS — Z5112 Encounter for antineoplastic immunotherapy: Secondary | ICD-10-CM | POA: Diagnosis not present

## 2021-09-16 DIAGNOSIS — C3401 Malignant neoplasm of right main bronchus: Secondary | ICD-10-CM | POA: Diagnosis not present

## 2021-09-16 DIAGNOSIS — J9859 Other diseases of mediastinum, not elsewhere classified: Secondary | ICD-10-CM | POA: Diagnosis not present

## 2021-09-16 DIAGNOSIS — R634 Abnormal weight loss: Secondary | ICD-10-CM | POA: Diagnosis not present

## 2021-09-17 DIAGNOSIS — E538 Deficiency of other specified B group vitamins: Secondary | ICD-10-CM | POA: Diagnosis not present

## 2021-09-25 ENCOUNTER — Ambulatory Visit (INDEPENDENT_AMBULATORY_CARE_PROVIDER_SITE_OTHER): Payer: Medicare Other | Admitting: *Deleted

## 2021-09-25 DIAGNOSIS — Z5181 Encounter for therapeutic drug level monitoring: Secondary | ICD-10-CM

## 2021-09-25 DIAGNOSIS — Z952 Presence of prosthetic heart valve: Secondary | ICD-10-CM | POA: Diagnosis not present

## 2021-09-25 LAB — POCT INR: INR: 2.1 (ref 2.0–3.0)

## 2021-09-25 NOTE — Patient Instructions (Signed)
Continue warfarin 1/2 tablet daily except 1 tablet on Sundays, Tuesdays and Thursdays ?Recheck in 3 weeks ?

## 2021-09-27 DIAGNOSIS — Z09 Encounter for follow-up examination after completed treatment for conditions other than malignant neoplasm: Secondary | ICD-10-CM | POA: Diagnosis not present

## 2021-09-27 DIAGNOSIS — C3401 Malignant neoplasm of right main bronchus: Secondary | ICD-10-CM | POA: Diagnosis not present

## 2021-09-27 DIAGNOSIS — Z5112 Encounter for antineoplastic immunotherapy: Secondary | ICD-10-CM | POA: Diagnosis not present

## 2021-09-30 DIAGNOSIS — C3401 Malignant neoplasm of right main bronchus: Secondary | ICD-10-CM | POA: Diagnosis not present

## 2021-09-30 DIAGNOSIS — J9859 Other diseases of mediastinum, not elsewhere classified: Secondary | ICD-10-CM | POA: Diagnosis not present

## 2021-09-30 DIAGNOSIS — R634 Abnormal weight loss: Secondary | ICD-10-CM | POA: Diagnosis not present

## 2021-09-30 DIAGNOSIS — Z5112 Encounter for antineoplastic immunotherapy: Secondary | ICD-10-CM | POA: Diagnosis not present

## 2021-10-01 DIAGNOSIS — J449 Chronic obstructive pulmonary disease, unspecified: Secondary | ICD-10-CM | POA: Diagnosis not present

## 2021-10-01 DIAGNOSIS — F17210 Nicotine dependence, cigarettes, uncomplicated: Secondary | ICD-10-CM | POA: Diagnosis not present

## 2021-10-01 DIAGNOSIS — Z681 Body mass index (BMI) 19 or less, adult: Secondary | ICD-10-CM | POA: Diagnosis not present

## 2021-10-01 DIAGNOSIS — R059 Cough, unspecified: Secondary | ICD-10-CM | POA: Diagnosis not present

## 2021-10-01 DIAGNOSIS — C3490 Malignant neoplasm of unspecified part of unspecified bronchus or lung: Secondary | ICD-10-CM | POA: Diagnosis not present

## 2021-10-08 DIAGNOSIS — Z20822 Contact with and (suspected) exposure to covid-19: Secondary | ICD-10-CM | POA: Diagnosis not present

## 2021-10-10 DIAGNOSIS — Z20822 Contact with and (suspected) exposure to covid-19: Secondary | ICD-10-CM | POA: Diagnosis not present

## 2021-10-14 DIAGNOSIS — C7880 Secondary malignant neoplasm of unspecified digestive organ: Secondary | ICD-10-CM | POA: Diagnosis not present

## 2021-10-14 DIAGNOSIS — E039 Hypothyroidism, unspecified: Secondary | ICD-10-CM | POA: Diagnosis not present

## 2021-10-14 DIAGNOSIS — Z7901 Long term (current) use of anticoagulants: Secondary | ICD-10-CM | POA: Diagnosis not present

## 2021-10-14 DIAGNOSIS — C3401 Malignant neoplasm of right main bronchus: Secondary | ICD-10-CM | POA: Diagnosis not present

## 2021-10-14 DIAGNOSIS — Z09 Encounter for follow-up examination after completed treatment for conditions other than malignant neoplasm: Secondary | ICD-10-CM | POA: Diagnosis not present

## 2021-10-15 DIAGNOSIS — Z5112 Encounter for antineoplastic immunotherapy: Secondary | ICD-10-CM | POA: Diagnosis not present

## 2021-10-15 DIAGNOSIS — R634 Abnormal weight loss: Secondary | ICD-10-CM | POA: Diagnosis not present

## 2021-10-15 DIAGNOSIS — J9859 Other diseases of mediastinum, not elsewhere classified: Secondary | ICD-10-CM | POA: Diagnosis not present

## 2021-10-15 DIAGNOSIS — C3401 Malignant neoplasm of right main bronchus: Secondary | ICD-10-CM | POA: Diagnosis not present

## 2021-10-16 ENCOUNTER — Ambulatory Visit (INDEPENDENT_AMBULATORY_CARE_PROVIDER_SITE_OTHER): Payer: Medicare Other | Admitting: *Deleted

## 2021-10-16 DIAGNOSIS — Z952 Presence of prosthetic heart valve: Secondary | ICD-10-CM

## 2021-10-16 DIAGNOSIS — Z5181 Encounter for therapeutic drug level monitoring: Secondary | ICD-10-CM | POA: Diagnosis not present

## 2021-10-16 LAB — POCT INR: INR: 1.7 — AB (ref 2.0–3.0)

## 2021-10-16 MED ORDER — ENOXAPARIN SODIUM 40 MG/0.4ML IJ SOSY: 40.0000 mg | PREFILLED_SYRINGE | Freq: Two times a day (BID) | INTRAMUSCULAR | 0 refills | Status: DC

## 2021-10-16 NOTE — Patient Instructions (Signed)
Take warfarin 1 1/2 tablets tonight then resume 1/2 tablet daily except 1 tablet on Sundays, Tuesdays and Thursdays ?Pending EGD on 11/04/21 at [facility].  Will hold warfarin 5 days before procedure and bridge with Lovenox ?Recheck in 2 weeks ?

## 2021-10-17 ENCOUNTER — Telehealth: Payer: Self-pay | Admitting: *Deleted

## 2021-10-17 ENCOUNTER — Encounter: Payer: Self-pay | Admitting: *Deleted

## 2021-10-17 NOTE — Telephone Encounter (Signed)
Spoke with Edrick Oh RN regarding quantity of lovenox injections sent to pharmacy. Per Lattie Haw, should be # 20 instead of 10. [facility] pharmacist contacted and corrected quantity. ?

## 2021-10-22 ENCOUNTER — Telehealth: Payer: Self-pay | Admitting: *Deleted

## 2021-10-22 NOTE — Telephone Encounter (Signed)
Spoke with Mattie at [employer] Department to do an appeals to make correction on denied lovenox prior authorization done through covermymeds. Gave corrected quantity of 20 syringes for BID subQ injections every 12 hours for 10 days. Per [facility], will go to review department and office would be notified by fax with decision.  ?

## 2021-10-24 ENCOUNTER — Encounter: Payer: Self-pay | Admitting: *Deleted

## 2021-10-24 DIAGNOSIS — R0902 Hypoxemia: Secondary | ICD-10-CM | POA: Diagnosis not present

## 2021-10-24 DIAGNOSIS — Z681 Body mass index (BMI) 19 or less, adult: Secondary | ICD-10-CM | POA: Diagnosis not present

## 2021-10-24 DIAGNOSIS — I10 Essential (primary) hypertension: Secondary | ICD-10-CM | POA: Diagnosis not present

## 2021-10-24 DIAGNOSIS — C785 Secondary malignant neoplasm of large intestine and rectum: Secondary | ICD-10-CM | POA: Diagnosis not present

## 2021-10-24 DIAGNOSIS — E538 Deficiency of other specified B group vitamins: Secondary | ICD-10-CM | POA: Diagnosis not present

## 2021-10-24 DIAGNOSIS — D649 Anemia, unspecified: Secondary | ICD-10-CM | POA: Diagnosis not present

## 2021-10-24 DIAGNOSIS — I2510 Atherosclerotic heart disease of native coronary artery without angina pectoris: Secondary | ICD-10-CM | POA: Diagnosis not present

## 2021-10-24 DIAGNOSIS — M545 Low back pain, unspecified: Secondary | ICD-10-CM | POA: Diagnosis not present

## 2021-10-24 NOTE — Progress Notes (Signed)
Fax notification received from [facility] that Enoxaparin 40 mg/0.49ml syringe approved coverage for an additional quantity from July 14, 2021 until October 24, 2022. ?

## 2021-10-25 ENCOUNTER — Telehealth: Payer: Self-pay | Admitting: *Deleted

## 2021-10-25 DIAGNOSIS — R918 Other nonspecific abnormal finding of lung field: Secondary | ICD-10-CM | POA: Diagnosis not present

## 2021-10-25 DIAGNOSIS — R319 Hematuria, unspecified: Secondary | ICD-10-CM | POA: Diagnosis not present

## 2021-10-25 DIAGNOSIS — C3401 Malignant neoplasm of right main bronchus: Secondary | ICD-10-CM | POA: Diagnosis not present

## 2021-10-25 DIAGNOSIS — R300 Dysuria: Secondary | ICD-10-CM | POA: Diagnosis not present

## 2021-10-25 DIAGNOSIS — C3491 Malignant neoplasm of unspecified part of right bronchus or lung: Secondary | ICD-10-CM | POA: Diagnosis not present

## 2021-10-25 DIAGNOSIS — I77810 Thoracic aortic ectasia: Secondary | ICD-10-CM | POA: Diagnosis not present

## 2021-10-25 NOTE — Telephone Encounter (Signed)
Calling in regards to her coumadin being adjust  ?

## 2021-10-25 NOTE — Telephone Encounter (Addendum)
Called and spoke to pt. Pt stated that she had a shot of rocephin and a steroid injection yesterday and was wondering if any adjustments needed to be made to her warfarin. Informed pt that no adjustments need to be made at this time. However, pt is to come to office on Monday and have INR checked.  ?

## 2021-10-28 ENCOUNTER — Ambulatory Visit (INDEPENDENT_AMBULATORY_CARE_PROVIDER_SITE_OTHER): Payer: Medicare Other | Admitting: *Deleted

## 2021-10-28 DIAGNOSIS — R634 Abnormal weight loss: Secondary | ICD-10-CM | POA: Diagnosis not present

## 2021-10-28 DIAGNOSIS — J9859 Other diseases of mediastinum, not elsewhere classified: Secondary | ICD-10-CM | POA: Diagnosis not present

## 2021-10-28 DIAGNOSIS — Z5181 Encounter for therapeutic drug level monitoring: Secondary | ICD-10-CM

## 2021-10-28 DIAGNOSIS — J209 Acute bronchitis, unspecified: Secondary | ICD-10-CM | POA: Diagnosis not present

## 2021-10-28 DIAGNOSIS — Z681 Body mass index (BMI) 19 or less, adult: Secondary | ICD-10-CM | POA: Diagnosis not present

## 2021-10-28 DIAGNOSIS — E039 Hypothyroidism, unspecified: Secondary | ICD-10-CM | POA: Diagnosis not present

## 2021-10-28 DIAGNOSIS — Z952 Presence of prosthetic heart valve: Secondary | ICD-10-CM | POA: Diagnosis not present

## 2021-10-28 DIAGNOSIS — C3401 Malignant neoplasm of right main bronchus: Secondary | ICD-10-CM | POA: Diagnosis not present

## 2021-10-28 DIAGNOSIS — Z09 Encounter for follow-up examination after completed treatment for conditions other than malignant neoplasm: Secondary | ICD-10-CM | POA: Diagnosis not present

## 2021-10-28 LAB — POCT INR: INR: 4 — AB (ref 2.0–3.0)

## 2021-10-28 NOTE — Patient Instructions (Addendum)
11/04/21  EGD with dilatation at [facility] ? ?Labs:  10/14/21  Hgb 16.1  Hct 50.5  Plts 560  SCr 1.27  CrCl 32.18  Wt 48kg ? ?4/18  Last dose of warfarin ------take 2mg  pm ?4/19  No Lovenox or warfarin ?4/20 - 4/22  Inject lovenox 40mg  twice daily at 7am & 7pm ?4/23  Inject Lovenox 40mg  at 7am only------No Lovenox in pm ?4/24  No Lovenox in am -------procedure --------warfarin 3mg  in pm ?4/25  Lovenox 40mg  at 7am & 7pm and warfarin 3mg  in pm ?4/26-4/27  Lovenox 40mg  at 7am & 7pm and Warfarin 2mg  in pm ?4/28  Lovenox 40mg  at 7am-----------INR appt at 3:30pm ?

## 2021-11-01 NOTE — Telephone Encounter (Signed)
Received approval for patient from 07/14/21-10/24/22. ?

## 2021-11-04 DIAGNOSIS — I2510 Atherosclerotic heart disease of native coronary artery without angina pectoris: Secondary | ICD-10-CM | POA: Diagnosis not present

## 2021-11-04 DIAGNOSIS — Z79899 Other long term (current) drug therapy: Secondary | ICD-10-CM | POA: Diagnosis not present

## 2021-11-04 DIAGNOSIS — Z7982 Long term (current) use of aspirin: Secondary | ICD-10-CM | POA: Diagnosis not present

## 2021-11-04 DIAGNOSIS — K219 Gastro-esophageal reflux disease without esophagitis: Secondary | ICD-10-CM | POA: Diagnosis not present

## 2021-11-04 DIAGNOSIS — K449 Diaphragmatic hernia without obstruction or gangrene: Secondary | ICD-10-CM | POA: Diagnosis not present

## 2021-11-04 DIAGNOSIS — J439 Emphysema, unspecified: Secondary | ICD-10-CM | POA: Diagnosis not present

## 2021-11-04 DIAGNOSIS — D649 Anemia, unspecified: Secondary | ICD-10-CM | POA: Diagnosis not present

## 2021-11-04 DIAGNOSIS — I10 Essential (primary) hypertension: Secondary | ICD-10-CM | POA: Diagnosis not present

## 2021-11-04 DIAGNOSIS — Z79890 Hormone replacement therapy (postmenopausal): Secondary | ICD-10-CM | POA: Diagnosis not present

## 2021-11-04 DIAGNOSIS — Z7901 Long term (current) use of anticoagulants: Secondary | ICD-10-CM | POA: Diagnosis not present

## 2021-11-04 DIAGNOSIS — K222 Esophageal obstruction: Secondary | ICD-10-CM | POA: Diagnosis not present

## 2021-11-04 DIAGNOSIS — E039 Hypothyroidism, unspecified: Secondary | ICD-10-CM | POA: Diagnosis not present

## 2021-11-04 DIAGNOSIS — I252 Old myocardial infarction: Secondary | ICD-10-CM | POA: Diagnosis not present

## 2021-11-07 DIAGNOSIS — Z20822 Contact with and (suspected) exposure to covid-19: Secondary | ICD-10-CM | POA: Diagnosis not present

## 2021-11-08 ENCOUNTER — Ambulatory Visit (INDEPENDENT_AMBULATORY_CARE_PROVIDER_SITE_OTHER): Payer: Medicare Other | Admitting: *Deleted

## 2021-11-08 DIAGNOSIS — Z952 Presence of prosthetic heart valve: Secondary | ICD-10-CM | POA: Diagnosis not present

## 2021-11-08 DIAGNOSIS — Z5181 Encounter for therapeutic drug level monitoring: Secondary | ICD-10-CM | POA: Diagnosis not present

## 2021-11-08 LAB — POCT INR: INR: 3.2 — AB (ref 2.0–3.0)

## 2021-11-08 NOTE — Patient Instructions (Signed)
Hold warfarin tonight then resume 1/2 tablet daily except 1 tablet on Sundays, Tuesdays and Thursdays ?Stop Lovenox injections ?Recheck 11/18/21 ?

## 2021-11-11 DIAGNOSIS — Z681 Body mass index (BMI) 19 or less, adult: Secondary | ICD-10-CM | POA: Diagnosis not present

## 2021-11-11 DIAGNOSIS — J209 Acute bronchitis, unspecified: Secondary | ICD-10-CM | POA: Diagnosis not present

## 2021-11-11 DIAGNOSIS — C7880 Secondary malignant neoplasm of unspecified digestive organ: Secondary | ICD-10-CM | POA: Diagnosis not present

## 2021-11-11 DIAGNOSIS — E039 Hypothyroidism, unspecified: Secondary | ICD-10-CM | POA: Diagnosis not present

## 2021-11-11 DIAGNOSIS — C3401 Malignant neoplasm of right main bronchus: Secondary | ICD-10-CM | POA: Diagnosis not present

## 2021-11-11 DIAGNOSIS — Z09 Encounter for follow-up examination after completed treatment for conditions other than malignant neoplasm: Secondary | ICD-10-CM | POA: Diagnosis not present

## 2021-11-11 DIAGNOSIS — Z20822 Contact with and (suspected) exposure to covid-19: Secondary | ICD-10-CM | POA: Diagnosis not present

## 2021-11-12 DIAGNOSIS — Z5112 Encounter for antineoplastic immunotherapy: Secondary | ICD-10-CM | POA: Diagnosis not present

## 2021-11-12 DIAGNOSIS — R634 Abnormal weight loss: Secondary | ICD-10-CM | POA: Diagnosis not present

## 2021-11-12 DIAGNOSIS — J9859 Other diseases of mediastinum, not elsewhere classified: Secondary | ICD-10-CM | POA: Diagnosis not present

## 2021-11-12 DIAGNOSIS — C3401 Malignant neoplasm of right main bronchus: Secondary | ICD-10-CM | POA: Diagnosis not present

## 2021-11-14 DIAGNOSIS — M810 Age-related osteoporosis without current pathological fracture: Secondary | ICD-10-CM | POA: Diagnosis not present

## 2021-11-14 DIAGNOSIS — M8088XD Other osteoporosis with current pathological fracture, vertebra(e), subsequent encounter for fracture with routine healing: Secondary | ICD-10-CM | POA: Diagnosis not present

## 2021-11-18 ENCOUNTER — Ambulatory Visit: Payer: Medicare Other | Admitting: Physician Assistant

## 2021-11-18 ENCOUNTER — Encounter: Payer: Self-pay | Admitting: Cardiovascular Disease

## 2021-11-18 ENCOUNTER — Ambulatory Visit (INDEPENDENT_AMBULATORY_CARE_PROVIDER_SITE_OTHER): Payer: Medicare Other | Admitting: Cardiovascular Disease

## 2021-11-18 VITALS — BP 118/62 | HR 84 | Ht 63.0 in | Wt 104.4 lb

## 2021-11-18 DIAGNOSIS — Z5181 Encounter for therapeutic drug level monitoring: Secondary | ICD-10-CM

## 2021-11-18 DIAGNOSIS — Z952 Presence of prosthetic heart valve: Secondary | ICD-10-CM | POA: Diagnosis not present

## 2021-11-18 DIAGNOSIS — I7121 Aneurysm of the ascending aorta, without rupture: Secondary | ICD-10-CM

## 2021-11-18 NOTE — Progress Notes (Signed)
Cardiology Office Note:   Date:  11/18/2021  NAME:  Judy Sawyer    MRN: [id number] DOB:  1955/09/01   PCP:  Lawerance Sabal, PA  Cardiologist:  None  Electrophysiologist:  None   Referring MD: Lawerance Sabal, PA   Chief Complaint  Patient presents with   Follow-up    History of Present Illness:   Judy Sawyer is a 66 y.o. female with a hx of aortic valve replacement, stage IV lung cancer in remission, ascending aortic aneurysm who presents for follow-up.  She reports she is doing well.  INR was slightly elevated last month but will have this rechecked tomorrow.  Denies any chest pain or trouble breathing.  She does have lung cancer but this is remission.  Also had colon cancer but this was surgically resected.  She overall is doing well without complaints.  She presents with her husband.  Continues to work 2 days/week.  Works for a [employer].  Denies any major symptoms in office.  Overall doing well.  We discussed her CT scans of her aorta.  Has been stable for quite some time.  I really suspect this will not be an issue for her.  Plan to recheck a CT scan in 1 to 2 years.  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 Colon CA -Status post surgery 4. Aortic aneurysm  -43 mm 2022 -43 mm 2019  Past Medical History: Past Medical History:  Diagnosis Date   Acute bronchitis    Anemia, unspecified    Aortic stenosis    previous cardiologisit: Dr. Titus Dubin in [place], [place]   Cancer [facility])    Carotid stenosis 05/13   mild less than 50% bilaterally   Chest pain    Collagen vascular disease (HCC)    COPD (chronic obstructive pulmonary disease) (HCC)    Fibromyalgia    GERD (gastroesophageal reflux disease)    Hypertension    Murmur    Tobacco abuse     Past Surgical History: Past Surgical History:  Procedure Laterality Date   ABDOMINAL  HYSTERECTOMY     AORTIC VALVE REPLACEMENT  11/2011   in [place]. St. Jude 19 mm   COLONOSCOPY     KNEE ARTHROSCOPY WITH LATERAL MENISECTOMY Left 08/16/2014   Procedure: LEFT KNEE ARTHROSCOPY WITH LATERAL MENISECTOMY;  Surgeon: Nilda Simmer, MD;  Location: [facility];  Service: Orthopedics;  Laterality: Left;   KNEE ARTHROSCOPY WITH MEDIAL MENISECTOMY Left 08/16/2014   Procedure: KNEE ARTHROSCOPY WITH MEDIAL MENISECTOMY;  Surgeon: Nilda Simmer, MD;  Location:  SURGERY CENTER;  Service: Orthopedics;  Laterality: Left;   TONSILLECTOMY     UPPER GI ENDOSCOPY      Current Medications: Current Meds  Medication Sig   aspirin EC 81 MG tablet Take 81 mg by mouth daily.   Cholecalciferol (VITAMIN D3) 1000 units CAPS Take 1 capsule by mouth daily.   fluticasone (FLONASE) 50 MCG/ACT nasal spray Place into both nostrils as needed for allergies or rhinitis.   HYDROcodone-acetaminophen (NORCO) 7.5-325 MG tablet Take 1 tablet by mouth daily.   ipilimumab (YERVOY) 200 MG/40ML SOLN Inject into the vein.   levothyroxine (SYNTHROID) 200 MCG tablet Take 200 mcg by mouth daily.   levothyroxine (SYNTHROID) 25 MCG tablet Take 1 tablet by mouth in the morning and at bedtime.   Melatonin 5 MG TABS Take 1 tablet by  mouth daily.   metoprolol tartrate (LOPRESSOR) 25 MG tablet Take 0.5 tablets (12.5 mg total) by mouth 2 (two) times daily.   NIVOLUMAB IV Inject into the vein.   pantoprazole (PROTONIX) 40 MG tablet Take 40 mg by mouth 2 (two) times daily.    prochlorperazine (COMPAZINE) 10 MG tablet Take 10 mg by mouth every 6 (six) hours as needed for nausea or vomiting.   Sennosides 8.6 MG CAPS Take 1 capsule by mouth daily as needed.    warfarin (COUMADIN) 2 MG tablet Take 1 tablet by mouth daily or as directed by the coumadin clinic.     Allergies:    Codeine   Social History: Social History   Socioeconomic History   Marital status: Married    Spouse name: Not on file   Number  of children: 2   Years of education: Not on file   Highest education level: Not on file  Occupational History   Occupation: [employer]  Tobacco Use   Smoking status: Former    Packs/day: 0.50    Years: 42.00    Pack years: 21.00    Types: Cigarettes    Start date: 07/14/1969    Quit date: 07/29/2017    Years since quitting: 4.3   Smokeless tobacco: Never  Vaping Use   Vaping Use: Never used  Substance and Sexual Activity   Alcohol use: No    Alcohol/week: 0.0 standard drinks   Drug use: No   Sexual activity: Not on file  Other Topics Concern   Not on file  Social History Narrative   Not on file   Social Determinants of Health   Financial Resource Strain: Not on file  Food Insecurity: Not on file  Transportation Needs: Not on file  Physical Activity: Not on file  Stress: Not on file  Social Connections: Not on file     Family History: The patient's family history includes Cancer in her mother; Diabetes type II in her sister; Emphysema in her father; Heart attack in her mother; Heart disease in her sister; Liver disease in her father; Rheumatic fever in her sister.  ROS:   All other ROS reviewed and negative. Pertinent positives noted in the HPI.     EKGs/Labs/Other Studies Reviewed:   The following studies were personally reviewed by me today:  NM Stress 04/2020   There was no ST segment deviation noted during stress. The study is normal. There are no perfusion defects This is a low risk study. The left ventricular ejection fraction is hyperdynamic (>65%).   TTE 05/04/2020  1. Left ventricular ejection fraction, by estimation, is 60 to 65%. The  left ventricle has normal function. The left ventricle has no regional  wall motion abnormalities. There is mild left ventricular hypertrophy.  Left ventricular diastolic parameters  are indeterminate.   2. Right ventricular systolic function is normal. The right ventricular  size is normal.   3. The mitral valve  is normal in structure. No evidence of mitral valve  regurgitation. No evidence of mitral stenosis.   4. According to medical record St Jude mechanical valve 19mm is in the AV  position.. The aortic valve has been repaired/replaced. Aortic valve  regurgitation is mild. No aortic stenosis is present.   5. The inferior vena cava is normal in size with greater than 50%  respiratory variability, suggesting right atrial pressure of 3 mmHg.   Recent Labs: 06/17/2021: BUN 6; Creatinine, Ser 0.86; Potassium 4.0; Sodium 143   Recent  Lipid Panel No results found for: CHOL, TRIG, HDL, CHOLHDL, VLDL, LDLCALC, LDLDIRECT  Physical Exam:   VS:  BP 118/62   Pulse 84   Ht 5\' 3"  (1.6 m)   Wt 104 lb 6.4 oz (47.4 kg)   SpO2 96%   BMI 18.49 kg/m    Wt Readings from Last 3 Encounters:  11/18/21 104 lb 6.4 oz (47.4 kg)  07/31/21 105 lb 12.8 oz (48 kg)  07/19/21 104 lb (47.2 kg)    General: Well nourished, well developed, in no acute distress Head: Atraumatic, normal size  Eyes: PEERLA, EOMI  Neck: Supple, no JVD Endocrine: No thryomegaly Cardiac: Normal S1, S2; RRR; no murmurs, rubs, or gallops Lungs: Clear to auscultation bilaterally, no wheezing, rhonchi or rales  Abd: Soft, nontender, no hepatomegaly  Ext: No edema, pulses 2+ Musculoskeletal: No deformities, BUE and BLE strength normal and equal Skin: Warm and dry, no rashes   Neuro: Alert and oriented to person, place, time, and situation, CNII-XII grossly intact, no focal deficits  Psych: Normal mood and affect   ASSESSMENT:   Judy Sawyer is a 66 y.o. female who presents for the following: 1. H/O mechanical aortic valve replacement   2. Encounter for therapeutic drug monitoring   3. Ascending aortic aneurysm, unspecified whether ruptured (HCC)     PLAN:   1. H/O mechanical aortic valve replacement 2. Encounter for therapeutic drug monitoring -History of aortic valve replacement in 2013 in [place].  Underwent a mechanical 19 mm  Saint Jude valve.  Reports no bypass surgery or a sending aortic repair.  Has done well since that time.  She will continue Coumadin for INR goal 2-3.  She is on aspirin 81 mg daily.  She understands that she needs SBE prophylaxis before dental work and certain procedures. -Her valve is 68 years old.  Most recent echo from 8 years shows that is stable.  No murmur on exam.  We will plan to recheck an echo likely next year and follow her closely since the valve is quite old.  Given its stability I suspect that we will last a long time. -I also discussed with her that she likely had a bicuspid aortic valve.  She was never explicitly told this.  I have recommended screening for her children.  3. Ascending aortic aneurysm, unspecified whether ruptured (HCC) -43 mm in 2019.  43 mm in 2022.  Plan to repeat 1 every 2 years.  She does demonstrate stability in the next 2 to 4 years we likely will forego further assessment.  Disposition: Return in about 1 year (around 11/19/2022).  Medication Adjustments/Labs and Tests Ordered: Current medicines are reviewed at length with the patient today.  Concerns regarding medicines are outlined above.  No orders of the defined types were placed in this encounter.  No orders of the defined types were placed in this encounter.   Patient Instructions  Medication Instructions:  No changes  Labwork: None  Testing/Procedures: None  Follow-Up: Follow up with Dr. Flora Lipps in 1 year  Any Other Special Instructions Will Be Listed Below (If Applicable).     If you need a refill on your cardiac medications before your next appointment, please call your pharmacy.    Time Spent with Patient: I have spent a total of 25 minutes with patient reviewing hospital notes, telemetry, EKGs, labs and examining the patient as well as establishing an assessment and plan that was discussed with the patient.  > 50% of time was spent  in direct patient care.  Signed, Lenna Gilford.  Flora Lipps, MD, [facility]  [facility]  [address] [place], [place] [address] [phone]  11/18/2021 1:40 PM

## 2021-11-18 NOTE — Patient Instructions (Signed)
Medication Instructions:  ?No changes ? ?Labwork: ?None ? ?Testing/Procedures: ?None ? ?Follow-Up: ?Follow up with Dr. Audie Box in 1 year ? ?Any Other Special Instructions Will Be Listed Below (If Applicable). ? ? ? ? ?If you need a refill on your cardiac medications before your next appointment, please call your pharmacy. ? ?

## 2021-11-19 ENCOUNTER — Ambulatory Visit (INDEPENDENT_AMBULATORY_CARE_PROVIDER_SITE_OTHER): Payer: Medicare Other | Admitting: *Deleted

## 2021-11-19 DIAGNOSIS — Z5181 Encounter for therapeutic drug level monitoring: Secondary | ICD-10-CM | POA: Diagnosis not present

## 2021-11-19 DIAGNOSIS — Z952 Presence of prosthetic heart valve: Secondary | ICD-10-CM | POA: Diagnosis not present

## 2021-11-19 LAB — POCT INR: INR: 2.2 (ref 2.0–3.0)

## 2021-11-19 NOTE — Patient Instructions (Signed)
Continue warfarin 1/2 tablet daily except 1 tablet on Sundays, Tuesdays and Thursdays ?Recheck  in 3 wks ?

## 2021-11-22 DIAGNOSIS — Z952 Presence of prosthetic heart valve: Secondary | ICD-10-CM | POA: Diagnosis not present

## 2021-11-22 DIAGNOSIS — J209 Acute bronchitis, unspecified: Secondary | ICD-10-CM | POA: Diagnosis not present

## 2021-11-22 DIAGNOSIS — I2510 Atherosclerotic heart disease of native coronary artery without angina pectoris: Secondary | ICD-10-CM | POA: Diagnosis not present

## 2021-11-22 DIAGNOSIS — I700 Atherosclerosis of aorta: Secondary | ICD-10-CM | POA: Diagnosis not present

## 2021-11-22 DIAGNOSIS — J948 Other specified pleural conditions: Secondary | ICD-10-CM | POA: Diagnosis not present

## 2021-11-22 DIAGNOSIS — K6389 Other specified diseases of intestine: Secondary | ICD-10-CM | POA: Diagnosis not present

## 2021-11-22 DIAGNOSIS — C3490 Malignant neoplasm of unspecified part of unspecified bronchus or lung: Secondary | ICD-10-CM | POA: Diagnosis not present

## 2021-11-22 DIAGNOSIS — C3401 Malignant neoplasm of right main bronchus: Secondary | ICD-10-CM | POA: Diagnosis not present

## 2021-11-22 DIAGNOSIS — Z9049 Acquired absence of other specified parts of digestive tract: Secondary | ICD-10-CM | POA: Diagnosis not present

## 2021-11-22 DIAGNOSIS — J439 Emphysema, unspecified: Secondary | ICD-10-CM | POA: Diagnosis not present

## 2021-11-22 DIAGNOSIS — K7689 Other specified diseases of liver: Secondary | ICD-10-CM | POA: Diagnosis not present

## 2021-11-22 DIAGNOSIS — I712 Thoracic aortic aneurysm, without rupture, unspecified: Secondary | ICD-10-CM | POA: Diagnosis not present

## 2021-11-22 DIAGNOSIS — K8689 Other specified diseases of pancreas: Secondary | ICD-10-CM | POA: Diagnosis not present

## 2021-11-25 DIAGNOSIS — E039 Hypothyroidism, unspecified: Secondary | ICD-10-CM | POA: Diagnosis not present

## 2021-11-25 DIAGNOSIS — J431 Panlobular emphysema: Secondary | ICD-10-CM | POA: Diagnosis not present

## 2021-11-25 DIAGNOSIS — M810 Age-related osteoporosis without current pathological fracture: Secondary | ICD-10-CM | POA: Diagnosis not present

## 2021-11-25 DIAGNOSIS — C3401 Malignant neoplasm of right main bronchus: Secondary | ICD-10-CM | POA: Diagnosis not present

## 2021-11-25 DIAGNOSIS — Z681 Body mass index (BMI) 19 or less, adult: Secondary | ICD-10-CM | POA: Diagnosis not present

## 2021-11-26 DIAGNOSIS — J9859 Other diseases of mediastinum, not elsewhere classified: Secondary | ICD-10-CM | POA: Diagnosis not present

## 2021-11-26 DIAGNOSIS — Z5112 Encounter for antineoplastic immunotherapy: Secondary | ICD-10-CM | POA: Diagnosis not present

## 2021-11-26 DIAGNOSIS — C3401 Malignant neoplasm of right main bronchus: Secondary | ICD-10-CM | POA: Diagnosis not present

## 2021-11-26 DIAGNOSIS — R634 Abnormal weight loss: Secondary | ICD-10-CM | POA: Diagnosis not present

## 2021-12-06 DIAGNOSIS — J9859 Other diseases of mediastinum, not elsewhere classified: Secondary | ICD-10-CM | POA: Diagnosis not present

## 2021-12-06 DIAGNOSIS — J431 Panlobular emphysema: Secondary | ICD-10-CM | POA: Diagnosis not present

## 2021-12-06 DIAGNOSIS — Z681 Body mass index (BMI) 19 or less, adult: Secondary | ICD-10-CM | POA: Diagnosis not present

## 2021-12-06 DIAGNOSIS — R634 Abnormal weight loss: Secondary | ICD-10-CM | POA: Diagnosis not present

## 2021-12-06 DIAGNOSIS — C3401 Malignant neoplasm of right main bronchus: Secondary | ICD-10-CM | POA: Diagnosis not present

## 2021-12-06 DIAGNOSIS — M810 Age-related osteoporosis without current pathological fracture: Secondary | ICD-10-CM | POA: Diagnosis not present

## 2021-12-10 DIAGNOSIS — J9859 Other diseases of mediastinum, not elsewhere classified: Secondary | ICD-10-CM | POA: Diagnosis not present

## 2021-12-10 DIAGNOSIS — R634 Abnormal weight loss: Secondary | ICD-10-CM | POA: Diagnosis not present

## 2021-12-10 DIAGNOSIS — Z5112 Encounter for antineoplastic immunotherapy: Secondary | ICD-10-CM | POA: Diagnosis not present

## 2021-12-10 DIAGNOSIS — C3401 Malignant neoplasm of right main bronchus: Secondary | ICD-10-CM | POA: Diagnosis not present

## 2021-12-11 ENCOUNTER — Ambulatory Visit (INDEPENDENT_AMBULATORY_CARE_PROVIDER_SITE_OTHER): Payer: Medicare Other | Admitting: *Deleted

## 2021-12-11 DIAGNOSIS — Z952 Presence of prosthetic heart valve: Secondary | ICD-10-CM | POA: Diagnosis not present

## 2021-12-11 DIAGNOSIS — Z5181 Encounter for therapeutic drug level monitoring: Secondary | ICD-10-CM | POA: Diagnosis not present

## 2021-12-11 LAB — POCT INR: INR: 2.5 (ref 2.0–3.0)

## 2021-12-11 NOTE — Patient Instructions (Signed)
Continue warfarin 1/2 tablet daily except 1 tablet on Sundays, Tuesdays and Thursdays Recheck  in 3 wks

## 2021-12-23 DIAGNOSIS — M810 Age-related osteoporosis without current pathological fracture: Secondary | ICD-10-CM | POA: Diagnosis not present

## 2021-12-23 DIAGNOSIS — C3401 Malignant neoplasm of right main bronchus: Secondary | ICD-10-CM | POA: Diagnosis not present

## 2021-12-23 DIAGNOSIS — K5903 Drug induced constipation: Secondary | ICD-10-CM | POA: Diagnosis not present

## 2021-12-23 DIAGNOSIS — J9859 Other diseases of mediastinum, not elsewhere classified: Secondary | ICD-10-CM | POA: Diagnosis not present

## 2021-12-23 DIAGNOSIS — Z681 Body mass index (BMI) 19 or less, adult: Secondary | ICD-10-CM | POA: Diagnosis not present

## 2021-12-23 DIAGNOSIS — E039 Hypothyroidism, unspecified: Secondary | ICD-10-CM | POA: Diagnosis not present

## 2021-12-23 DIAGNOSIS — C7880 Secondary malignant neoplasm of unspecified digestive organ: Secondary | ICD-10-CM | POA: Diagnosis not present

## 2021-12-23 DIAGNOSIS — R634 Abnormal weight loss: Secondary | ICD-10-CM | POA: Diagnosis not present

## 2021-12-23 DIAGNOSIS — G893 Neoplasm related pain (acute) (chronic): Secondary | ICD-10-CM | POA: Diagnosis not present

## 2021-12-23 DIAGNOSIS — T402X5A Adverse effect of other opioids, initial encounter: Secondary | ICD-10-CM | POA: Diagnosis not present

## 2021-12-23 DIAGNOSIS — J431 Panlobular emphysema: Secondary | ICD-10-CM | POA: Diagnosis not present

## 2021-12-23 DIAGNOSIS — Z5111 Encounter for antineoplastic chemotherapy: Secondary | ICD-10-CM | POA: Diagnosis not present

## 2021-12-24 DIAGNOSIS — R634 Abnormal weight loss: Secondary | ICD-10-CM | POA: Diagnosis not present

## 2021-12-24 DIAGNOSIS — C3401 Malignant neoplasm of right main bronchus: Secondary | ICD-10-CM | POA: Diagnosis not present

## 2021-12-24 DIAGNOSIS — J9859 Other diseases of mediastinum, not elsewhere classified: Secondary | ICD-10-CM | POA: Diagnosis not present

## 2022-01-02 DIAGNOSIS — J019 Acute sinusitis, unspecified: Secondary | ICD-10-CM | POA: Diagnosis not present

## 2022-01-02 DIAGNOSIS — R059 Cough, unspecified: Secondary | ICD-10-CM | POA: Diagnosis not present

## 2022-01-02 DIAGNOSIS — Z681 Body mass index (BMI) 19 or less, adult: Secondary | ICD-10-CM | POA: Diagnosis not present

## 2022-01-02 DIAGNOSIS — F17210 Nicotine dependence, cigarettes, uncomplicated: Secondary | ICD-10-CM | POA: Diagnosis not present

## 2022-01-02 DIAGNOSIS — J441 Chronic obstructive pulmonary disease with (acute) exacerbation: Secondary | ICD-10-CM | POA: Diagnosis not present

## 2022-01-02 DIAGNOSIS — R030 Elevated blood-pressure reading, without diagnosis of hypertension: Secondary | ICD-10-CM | POA: Diagnosis not present

## 2022-01-02 DIAGNOSIS — E538 Deficiency of other specified B group vitamins: Secondary | ICD-10-CM | POA: Diagnosis not present

## 2022-01-06 ENCOUNTER — Ambulatory Visit (INDEPENDENT_AMBULATORY_CARE_PROVIDER_SITE_OTHER): Payer: Medicare Other | Admitting: *Deleted

## 2022-01-06 DIAGNOSIS — C3401 Malignant neoplasm of right main bronchus: Secondary | ICD-10-CM | POA: Diagnosis not present

## 2022-01-06 DIAGNOSIS — C7880 Secondary malignant neoplasm of unspecified digestive organ: Secondary | ICD-10-CM | POA: Diagnosis not present

## 2022-01-06 DIAGNOSIS — Z5181 Encounter for therapeutic drug level monitoring: Secondary | ICD-10-CM

## 2022-01-06 DIAGNOSIS — R634 Abnormal weight loss: Secondary | ICD-10-CM | POA: Diagnosis not present

## 2022-01-06 DIAGNOSIS — E039 Hypothyroidism, unspecified: Secondary | ICD-10-CM | POA: Diagnosis not present

## 2022-01-06 DIAGNOSIS — K222 Esophageal obstruction: Secondary | ICD-10-CM | POA: Diagnosis not present

## 2022-01-06 DIAGNOSIS — Z952 Presence of prosthetic heart valve: Secondary | ICD-10-CM | POA: Diagnosis not present

## 2022-01-06 DIAGNOSIS — J9859 Other diseases of mediastinum, not elsewhere classified: Secondary | ICD-10-CM | POA: Diagnosis not present

## 2022-01-06 LAB — POCT INR: INR: 2.1 (ref 2.0–3.0)

## 2022-01-07 DIAGNOSIS — R42 Dizziness and giddiness: Secondary | ICD-10-CM | POA: Diagnosis not present

## 2022-01-07 DIAGNOSIS — Z8669 Personal history of other diseases of the nervous system and sense organs: Secondary | ICD-10-CM | POA: Diagnosis not present

## 2022-01-07 DIAGNOSIS — R634 Abnormal weight loss: Secondary | ICD-10-CM | POA: Diagnosis not present

## 2022-01-07 DIAGNOSIS — E039 Hypothyroidism, unspecified: Secondary | ICD-10-CM | POA: Diagnosis not present

## 2022-01-07 DIAGNOSIS — Z5112 Encounter for antineoplastic immunotherapy: Secondary | ICD-10-CM | POA: Diagnosis not present

## 2022-01-07 DIAGNOSIS — Z681 Body mass index (BMI) 19 or less, adult: Secondary | ICD-10-CM | POA: Diagnosis not present

## 2022-01-07 DIAGNOSIS — J9859 Other diseases of mediastinum, not elsewhere classified: Secondary | ICD-10-CM | POA: Diagnosis not present

## 2022-01-07 DIAGNOSIS — C7880 Secondary malignant neoplasm of unspecified digestive organ: Secondary | ICD-10-CM | POA: Diagnosis not present

## 2022-01-07 DIAGNOSIS — C3401 Malignant neoplasm of right main bronchus: Secondary | ICD-10-CM | POA: Diagnosis not present

## 2022-01-10 DIAGNOSIS — R42 Dizziness and giddiness: Secondary | ICD-10-CM | POA: Diagnosis not present

## 2022-01-10 DIAGNOSIS — H538 Other visual disturbances: Secondary | ICD-10-CM | POA: Diagnosis not present

## 2022-01-10 DIAGNOSIS — C3401 Malignant neoplasm of right main bronchus: Secondary | ICD-10-CM | POA: Diagnosis not present

## 2022-01-27 DIAGNOSIS — J9859 Other diseases of mediastinum, not elsewhere classified: Secondary | ICD-10-CM | POA: Diagnosis not present

## 2022-01-27 DIAGNOSIS — Z09 Encounter for follow-up examination after completed treatment for conditions other than malignant neoplasm: Secondary | ICD-10-CM | POA: Diagnosis not present

## 2022-01-27 DIAGNOSIS — C3401 Malignant neoplasm of right main bronchus: Secondary | ICD-10-CM | POA: Diagnosis not present

## 2022-01-27 DIAGNOSIS — C7880 Secondary malignant neoplasm of unspecified digestive organ: Secondary | ICD-10-CM | POA: Diagnosis not present

## 2022-01-27 DIAGNOSIS — R634 Abnormal weight loss: Secondary | ICD-10-CM | POA: Diagnosis not present

## 2022-01-27 DIAGNOSIS — Z681 Body mass index (BMI) 19 or less, adult: Secondary | ICD-10-CM | POA: Diagnosis not present

## 2022-01-27 DIAGNOSIS — E039 Hypothyroidism, unspecified: Secondary | ICD-10-CM | POA: Diagnosis not present

## 2022-01-29 DIAGNOSIS — C3401 Malignant neoplasm of right main bronchus: Secondary | ICD-10-CM | POA: Diagnosis not present

## 2022-01-29 DIAGNOSIS — Z5112 Encounter for antineoplastic immunotherapy: Secondary | ICD-10-CM | POA: Diagnosis not present

## 2022-01-29 DIAGNOSIS — R634 Abnormal weight loss: Secondary | ICD-10-CM | POA: Diagnosis not present

## 2022-01-29 DIAGNOSIS — J9859 Other diseases of mediastinum, not elsewhere classified: Secondary | ICD-10-CM | POA: Diagnosis not present

## 2022-02-06 ENCOUNTER — Ambulatory Visit (INDEPENDENT_AMBULATORY_CARE_PROVIDER_SITE_OTHER): Payer: Medicare Other | Admitting: *Deleted

## 2022-02-06 DIAGNOSIS — Z5181 Encounter for therapeutic drug level monitoring: Secondary | ICD-10-CM | POA: Diagnosis not present

## 2022-02-06 DIAGNOSIS — Z952 Presence of prosthetic heart valve: Secondary | ICD-10-CM

## 2022-02-06 LAB — POCT INR: INR: 1.9 — AB (ref 2.0–3.0)

## 2022-02-06 NOTE — Patient Instructions (Signed)
Description   Take 1.5 tablets of warfarin today, then continue warfarin 1/2 tablet daily except 1 tablet on Sundays, Tuesdays and Thursdays Recheck  in 3 wks

## 2022-02-11 DIAGNOSIS — J9859 Other diseases of mediastinum, not elsewhere classified: Secondary | ICD-10-CM | POA: Diagnosis not present

## 2022-02-11 DIAGNOSIS — C3401 Malignant neoplasm of right main bronchus: Secondary | ICD-10-CM | POA: Diagnosis not present

## 2022-02-11 DIAGNOSIS — E039 Hypothyroidism, unspecified: Secondary | ICD-10-CM | POA: Diagnosis not present

## 2022-02-11 DIAGNOSIS — R634 Abnormal weight loss: Secondary | ICD-10-CM | POA: Diagnosis not present

## 2022-02-12 ENCOUNTER — Other Ambulatory Visit: Payer: Self-pay | Admitting: Student

## 2022-02-12 ENCOUNTER — Other Ambulatory Visit: Payer: Self-pay | Admitting: Cardiology

## 2022-02-12 DIAGNOSIS — Z79899 Other long term (current) drug therapy: Secondary | ICD-10-CM | POA: Diagnosis not present

## 2022-02-12 DIAGNOSIS — R634 Abnormal weight loss: Secondary | ICD-10-CM | POA: Diagnosis not present

## 2022-02-12 DIAGNOSIS — Z7982 Long term (current) use of aspirin: Secondary | ICD-10-CM | POA: Diagnosis not present

## 2022-02-12 DIAGNOSIS — M818 Other osteoporosis without current pathological fracture: Secondary | ICD-10-CM | POA: Diagnosis not present

## 2022-02-12 DIAGNOSIS — Z681 Body mass index (BMI) 19 or less, adult: Secondary | ICD-10-CM | POA: Diagnosis not present

## 2022-02-12 DIAGNOSIS — C3401 Malignant neoplasm of right main bronchus: Secondary | ICD-10-CM | POA: Diagnosis not present

## 2022-02-12 DIAGNOSIS — M810 Age-related osteoporosis without current pathological fracture: Secondary | ICD-10-CM | POA: Diagnosis not present

## 2022-02-12 DIAGNOSIS — I10 Essential (primary) hypertension: Secondary | ICD-10-CM | POA: Diagnosis not present

## 2022-02-12 DIAGNOSIS — Z7901 Long term (current) use of anticoagulants: Secondary | ICD-10-CM | POA: Diagnosis not present

## 2022-02-12 DIAGNOSIS — Z79890 Hormone replacement therapy (postmenopausal): Secondary | ICD-10-CM | POA: Diagnosis not present

## 2022-02-12 DIAGNOSIS — E039 Hypothyroidism, unspecified: Secondary | ICD-10-CM | POA: Diagnosis not present

## 2022-02-12 DIAGNOSIS — I7121 Aneurysm of the ascending aorta, without rupture: Secondary | ICD-10-CM | POA: Diagnosis not present

## 2022-02-12 DIAGNOSIS — Z7951 Long term (current) use of inhaled steroids: Secondary | ICD-10-CM | POA: Diagnosis not present

## 2022-02-12 DIAGNOSIS — J439 Emphysema, unspecified: Secondary | ICD-10-CM | POA: Diagnosis not present

## 2022-02-12 DIAGNOSIS — Z5112 Encounter for antineoplastic immunotherapy: Secondary | ICD-10-CM | POA: Diagnosis not present

## 2022-02-12 DIAGNOSIS — R1030 Lower abdominal pain, unspecified: Secondary | ICD-10-CM | POA: Diagnosis not present

## 2022-02-12 DIAGNOSIS — I252 Old myocardial infarction: Secondary | ICD-10-CM | POA: Diagnosis not present

## 2022-02-12 DIAGNOSIS — R109 Unspecified abdominal pain: Secondary | ICD-10-CM | POA: Diagnosis not present

## 2022-02-12 DIAGNOSIS — I2510 Atherosclerotic heart disease of native coronary artery without angina pectoris: Secondary | ICD-10-CM | POA: Diagnosis not present

## 2022-02-12 DIAGNOSIS — J9859 Other diseases of mediastinum, not elsewhere classified: Secondary | ICD-10-CM | POA: Diagnosis not present

## 2022-02-12 NOTE — Telephone Encounter (Signed)
Refill request for warfarin:  Last INR was 1.9 on 02/06/22 Next INR due on 02/27/22 LOV was 11/18/21  W [place]  Refill approved.

## 2022-02-20 ENCOUNTER — Other Ambulatory Visit: Payer: Self-pay | Admitting: *Deleted

## 2022-02-20 NOTE — Patient Outreach (Signed)
  Care Coordination   02/20/2022 Name: Judy Sawyer MRN: [id number] DOB: 12/09/1955   Care Coordination Outreach Attempts:  An unsuccessful telephone outreach was attempted today to offer the patient information about available care coordination services as a benefit of their health plan.   Follow Up Plan:  Additional outreach attempts will be made to offer the patient care coordination information and services.   Encounter Outcome:  No Answer  Care Coordination Interventions Activated:  No   Care Coordination Interventions:  No, not indicated    Valente David, RN, MSN, [facility] Care Coordinator [phone]

## 2022-02-24 DIAGNOSIS — E039 Hypothyroidism, unspecified: Secondary | ICD-10-CM | POA: Diagnosis not present

## 2022-02-24 DIAGNOSIS — C3401 Malignant neoplasm of right main bronchus: Secondary | ICD-10-CM | POA: Diagnosis not present

## 2022-02-25 ENCOUNTER — Other Ambulatory Visit: Payer: Self-pay | Admitting: *Deleted

## 2022-02-25 DIAGNOSIS — M818 Other osteoporosis without current pathological fracture: Secondary | ICD-10-CM | POA: Diagnosis not present

## 2022-02-25 DIAGNOSIS — J9859 Other diseases of mediastinum, not elsewhere classified: Secondary | ICD-10-CM | POA: Diagnosis not present

## 2022-02-25 DIAGNOSIS — Z5112 Encounter for antineoplastic immunotherapy: Secondary | ICD-10-CM | POA: Diagnosis not present

## 2022-02-25 DIAGNOSIS — C3401 Malignant neoplasm of right main bronchus: Secondary | ICD-10-CM | POA: Diagnosis not present

## 2022-02-25 DIAGNOSIS — R634 Abnormal weight loss: Secondary | ICD-10-CM | POA: Diagnosis not present

## 2022-02-25 NOTE — Patient Outreach (Signed)
  Care Coordination   02/25/2022 Name: Judy Sawyer MRN: [id number] DOB: 03/30/1956   Care Coordination Outreach Attempts:  A second unsuccessful outreach was attempted today to offer the patient with information about available care coordination services as a benefit of their health plan.     Follow Up Plan:  Additional outreach attempts will be made to offer the patient care coordination information and services.   Encounter Outcome:  No Answer  Care Coordination Interventions Activated:  No   Care Coordination Interventions:  No, not indicated    Valente David, RN, MSN, [facility] Care Coordinator [phone]

## 2022-02-27 ENCOUNTER — Ambulatory Visit (INDEPENDENT_AMBULATORY_CARE_PROVIDER_SITE_OTHER): Payer: Medicare Other | Admitting: *Deleted

## 2022-02-27 ENCOUNTER — Telehealth: Payer: Self-pay | Admitting: *Deleted

## 2022-02-27 DIAGNOSIS — Z5181 Encounter for therapeutic drug level monitoring: Secondary | ICD-10-CM

## 2022-02-27 DIAGNOSIS — Z952 Presence of prosthetic heart valve: Secondary | ICD-10-CM

## 2022-02-27 LAB — POCT INR: INR: 3 (ref 2.0–3.0)

## 2022-02-27 NOTE — Patient Instructions (Signed)
Description   Continue warfarin 1/2 tablet daily except 1 tablet on Sundays, Tuesdays and Thursdays Pt is having endoscopy on 03/10/2022 Recheck inr 1 week post procedure.

## 2022-02-27 NOTE — Telephone Encounter (Signed)
Saw pt at anti-caog visit today. Pt stated that she is to have an endoscopy on 03/10/2022 by Dr. Adria Devon at [facility].   Pt stated that she has these done about every 3 months and is typically bridged with lovenox and hold warfarin 5 days prior to procedure.   Currently do not see clearance request.

## 2022-02-28 NOTE — Telephone Encounter (Signed)
Patient with diagnosis of mechanical aortic valve on warfarin for anticoagulation.    Procedure: Endoscopy Date of procedure: 03/10/22  Per office protocol, patient can hold warfarin for 5 days prior to procedure.    Patient will NOT not need bridging with Lovenox (enoxaparin) around procedure.  At last procedure, we recommended against bridging as patient has a mechanical aortic valve, but NO other risk factors. She experienced a thigh hematoma the last time she was bridged. I would again recommend against bridging.   I will send to Dr. Audie Box to confirm since patient has been bridged in the past, but based on current guidelines no indication to bridge.   **This guidance is not considered finalized until pre-operative APP has relayed final recommendations.**

## 2022-02-28 NOTE — Telephone Encounter (Signed)
Called and spoke to pt and informed her that she did not have to be bridged with lovenox and would need to hold warfarin 5 days prior to procedure.   Pt's endoscopy on 03/10/2022. Instructed pt to hold warfarin on 8/23, 8/24, 8/25, 8/26, and 8/27.   On 8/28 instructed pt to resume warfarin in the evening  or as directed by doctor (take an extra half tablet with usual dose for 2 days then resume normal dose).  Pt is scheduled to come in on 03/18/2022 to have INR check. Pt verbalized understanding.

## 2022-02-28 NOTE — Telephone Encounter (Addendum)
   Pre-operative Risk Assessment    Patient Name: Judy Sawyer  DOB: 02/02/1956 MRN: [id number]      Request for Surgical Clearance    Procedure: Endoscopy   Date of Surgery:  Clearance 03/10/22                                 Surgeon:  Dr. Renford Dills  Surgeon's Group or Practice Name:  GI procedures  Phone number:  [phone] Fax number:  [phone]   Type of Clearance Requested:  Pharmacy   Hold Coumadin 5 days prior    Type of Anesthesia:  Propofol     Signed, Mendel Ryder   02/28/2022, 8:47 AM

## 2022-02-28 NOTE — Telephone Encounter (Signed)
   Patient Name: Judy Sawyer  DOB: 11-24-1955 MRN: [id number]  Primary Cardiologist: None  Clinical pharmacists have reviewed the patient's past medical history, labs, and current medications as part of preoperative protocol coverage. The following recommendations have been made:  Patient with diagnosis of mechanical aortic valve on warfarin for anticoagulation.     Procedure: Endoscopy Date of procedure: 03/10/22   Per office protocol, patient can hold warfarin for 5 days prior to procedure.     Patient will NOT not need bridging with Lovenox (enoxaparin) around procedure (verified with Dr. Audie Box, primary cardiologist).   I will route this recommendation to the requesting party via Epic fax function and remove from pre-op pool.  Please call with questions.  Lenna Sciara, NP 02/28/2022, 12:06 PM

## 2022-03-03 ENCOUNTER — Encounter: Payer: Self-pay | Admitting: *Deleted

## 2022-03-03 ENCOUNTER — Other Ambulatory Visit: Payer: Self-pay | Admitting: *Deleted

## 2022-03-03 NOTE — Patient Instructions (Signed)
Visit Information  Thank you for taking time to visit with me today. Please don't hesitate to contact me if I can be of assistance to you before our next scheduled telephone appointment.  Following are the goals we discussed today:  Confirm you have AWV scheduled for this year  Patient verbalizes understanding of instructions and care plan provided today and agrees to view in [facility]. Active MyChart status and patient understanding of how to access instructions and care plan via MyChart confirmed with patient.     The patient has been provided with contact information for the care management team and has been advised to call with any health related questions or concerns.  No further follow up required: Patient will call with concerns if needed.  Valente David, RN, MSN, [facility] Care Coordinator [phone]

## 2022-03-03 NOTE — Patient Outreach (Signed)
  Care Coordination   03/03/2022 Name: Judy Sawyer MRN: [id number] DOB: 1955-12-20   Care Coordination Outreach Attempts:  A third unsuccessful outreach was attempted today to offer the patient with information about available care coordination services as a benefit of their health plan.   Follow Up Plan:  No further outreach attempts will be made at this time. We have been unable to contact the patient to offer or enroll patient in care coordination services  Encounter Outcome:  No Answer  Care Coordination Interventions Activated:  No   Care Coordination Interventions:  No, not indicated    Valente David, RN, MSN, [facility] Care Coordinator [phone]

## 2022-03-03 NOTE — Patient Outreach (Signed)
  Care Coordination   Initial Visit Note   03/03/2022 Name: Judy Sawyer MRN: [id number] DOB: 1956-05-13  Judy Sawyer is a 66 y.o. year old female who sees [place], [place], [place] for primary care. I spoke with  Juleen Starr by phone today  What matters to the patients health and wellness today?  Has history of lung cancer,  continues chemo treatments at [facility].  State she has lost about 15 pounds.  She and husband joined the [employer] to help build strength and endurance.    Goals Addressed               This Visit's Progress     Build strength (pt-stated)        Care Coordination Interventions: Advised patient to Continue routine working out at the [facility] Reviewed medications with patient and discussed notifying care coordination team with concerns about medication cost Reviewed scheduled/upcoming provider appointments including need for AWV, state she has one scheduled this year Assessed social determinant of health barriers         SDOH assessments and interventions completed:  Yes  SDOH Interventions Today    Flowsheet Row Most Recent Value  SDOH Interventions   Food Insecurity Interventions Intervention Not Indicated  Financial Strain Interventions Intervention Not Indicated  Housing Interventions Intervention Not Indicated  Transportation Interventions Intervention Not Indicated        Care Coordination Interventions Activated:  Yes  Care Coordination Interventions:  Yes, provided   Follow up plan: No further intervention required.   Encounter Outcome:  Pt. Visit Completed   Valente David, RN, MSN, [facility] Care Coordinator [phone]

## 2022-03-05 DIAGNOSIS — K769 Liver disease, unspecified: Secondary | ICD-10-CM | POA: Diagnosis not present

## 2022-03-05 DIAGNOSIS — J90 Pleural effusion, not elsewhere classified: Secondary | ICD-10-CM | POA: Diagnosis not present

## 2022-03-05 DIAGNOSIS — C3401 Malignant neoplasm of right main bronchus: Secondary | ICD-10-CM | POA: Diagnosis not present

## 2022-03-05 DIAGNOSIS — I700 Atherosclerosis of aorta: Secondary | ICD-10-CM | POA: Diagnosis not present

## 2022-03-05 DIAGNOSIS — C3490 Malignant neoplasm of unspecified part of unspecified bronchus or lung: Secondary | ICD-10-CM | POA: Diagnosis not present

## 2022-03-05 DIAGNOSIS — J439 Emphysema, unspecified: Secondary | ICD-10-CM | POA: Diagnosis not present

## 2022-03-05 DIAGNOSIS — I712 Thoracic aortic aneurysm, without rupture, unspecified: Secondary | ICD-10-CM | POA: Diagnosis not present

## 2022-03-10 DIAGNOSIS — K449 Diaphragmatic hernia without obstruction or gangrene: Secondary | ICD-10-CM | POA: Diagnosis not present

## 2022-03-10 DIAGNOSIS — J439 Emphysema, unspecified: Secondary | ICD-10-CM | POA: Diagnosis not present

## 2022-03-10 DIAGNOSIS — E039 Hypothyroidism, unspecified: Secondary | ICD-10-CM | POA: Diagnosis not present

## 2022-03-10 DIAGNOSIS — I10 Essential (primary) hypertension: Secondary | ICD-10-CM | POA: Diagnosis not present

## 2022-03-10 DIAGNOSIS — I252 Old myocardial infarction: Secondary | ICD-10-CM | POA: Diagnosis not present

## 2022-03-10 DIAGNOSIS — K219 Gastro-esophageal reflux disease without esophagitis: Secondary | ICD-10-CM | POA: Diagnosis not present

## 2022-03-10 DIAGNOSIS — K222 Esophageal obstruction: Secondary | ICD-10-CM | POA: Diagnosis not present

## 2022-03-10 DIAGNOSIS — I2510 Atherosclerotic heart disease of native coronary artery without angina pectoris: Secondary | ICD-10-CM | POA: Diagnosis not present

## 2022-03-11 DIAGNOSIS — M818 Other osteoporosis without current pathological fracture: Secondary | ICD-10-CM | POA: Diagnosis not present

## 2022-03-11 DIAGNOSIS — R109 Unspecified abdominal pain: Secondary | ICD-10-CM | POA: Diagnosis not present

## 2022-03-11 DIAGNOSIS — R634 Abnormal weight loss: Secondary | ICD-10-CM | POA: Diagnosis not present

## 2022-03-11 DIAGNOSIS — Z09 Encounter for follow-up examination after completed treatment for conditions other than malignant neoplasm: Secondary | ICD-10-CM | POA: Diagnosis not present

## 2022-03-11 DIAGNOSIS — E039 Hypothyroidism, unspecified: Secondary | ICD-10-CM | POA: Diagnosis not present

## 2022-03-11 DIAGNOSIS — C3401 Malignant neoplasm of right main bronchus: Secondary | ICD-10-CM | POA: Diagnosis not present

## 2022-03-11 DIAGNOSIS — J9859 Other diseases of mediastinum, not elsewhere classified: Secondary | ICD-10-CM | POA: Diagnosis not present

## 2022-03-11 DIAGNOSIS — Z681 Body mass index (BMI) 19 or less, adult: Secondary | ICD-10-CM | POA: Diagnosis not present

## 2022-03-11 DIAGNOSIS — R1030 Lower abdominal pain, unspecified: Secondary | ICD-10-CM | POA: Diagnosis not present

## 2022-03-11 DIAGNOSIS — E038 Other specified hypothyroidism: Secondary | ICD-10-CM | POA: Diagnosis not present

## 2022-03-12 DIAGNOSIS — C3401 Malignant neoplasm of right main bronchus: Secondary | ICD-10-CM | POA: Diagnosis not present

## 2022-03-18 ENCOUNTER — Ambulatory Visit: Payer: Medicare Other | Attending: Family Medicine | Admitting: *Deleted

## 2022-03-18 DIAGNOSIS — Z952 Presence of prosthetic heart valve: Secondary | ICD-10-CM | POA: Diagnosis not present

## 2022-03-18 DIAGNOSIS — Z5181 Encounter for therapeutic drug level monitoring: Secondary | ICD-10-CM | POA: Diagnosis not present

## 2022-03-18 LAB — POCT INR: INR: 2.8 (ref 2.0–3.0)

## 2022-03-18 NOTE — Patient Instructions (Signed)
Continue warfarin 1/2 tablet daily except 1 tablet on Sundays, Tuesdays and Thursdays Recheck in 4 wks

## 2022-03-25 ENCOUNTER — Telehealth: Payer: Self-pay | Admitting: Cardiovascular Disease

## 2022-03-25 DIAGNOSIS — T66XXXA Radiation sickness, unspecified, initial encounter: Secondary | ICD-10-CM | POA: Diagnosis not present

## 2022-03-25 DIAGNOSIS — C7880 Secondary malignant neoplasm of unspecified digestive organ: Secondary | ICD-10-CM | POA: Diagnosis not present

## 2022-03-25 DIAGNOSIS — K208 Other esophagitis without bleeding: Secondary | ICD-10-CM | POA: Diagnosis not present

## 2022-03-25 DIAGNOSIS — C3401 Malignant neoplasm of right main bronchus: Secondary | ICD-10-CM | POA: Diagnosis not present

## 2022-03-25 DIAGNOSIS — E039 Hypothyroidism, unspecified: Secondary | ICD-10-CM | POA: Diagnosis not present

## 2022-03-25 NOTE — Telephone Encounter (Signed)
   Pre-operative Risk Assessment    Patient Name: Judy Sawyer  DOB: 1956/01/28 MRN: [id number]     Request for Surgical Clearance    Procedure:   upper endoscopy  Date of Surgery:  Clearance TBD                                  Surgeon:  n/a Surgeon's Group or Practice Name:   [facility] GI Procedures  Phone number:  [phone] Fax number:  [phone]   Type of Clearance Requested:   - Pharmacy:  Hold Warfarin (Coumadin)     Type of Anesthesia:  Not Indicated   Additional requests/questions:  Please advise surgeon/provider what medications should be held.  Signed, Desma Paganini   03/25/2022, 1:25 PM

## 2022-03-26 DIAGNOSIS — C3401 Malignant neoplasm of right main bronchus: Secondary | ICD-10-CM | POA: Diagnosis not present

## 2022-03-26 DIAGNOSIS — R634 Abnormal weight loss: Secondary | ICD-10-CM | POA: Diagnosis not present

## 2022-03-26 DIAGNOSIS — J9859 Other diseases of mediastinum, not elsewhere classified: Secondary | ICD-10-CM | POA: Diagnosis not present

## 2022-03-26 DIAGNOSIS — Z5112 Encounter for antineoplastic immunotherapy: Secondary | ICD-10-CM | POA: Diagnosis not present

## 2022-03-26 NOTE — Telephone Encounter (Signed)
Patient with diagnosis of mechanical aortic valve on warfarin for anticoagulation.    Procedure: upper endoscopy Date of procedure: TBD  CrCl 42 Platelet count 585  Per office protocol, patient can hold warfarin for 5 days prior to procedure.   Patient will not need bridging with Lovenox (enoxaparin) around procedure.  **This guidance is not considered finalized until pre-operative APP has relayed final recommendations.**

## 2022-03-26 NOTE — Telephone Encounter (Signed)
   Patient Name: Judy Sawyer  DOB: 1955/08/30 MRN: [id number]  Primary Cardiologist: None  Clinical pharmacists have reviewed the patient's past medical history, labs, and current medications as part of preoperative protocol coverage. The following recommendations have been made:  Patient with diagnosis of mechanical aortic valve on warfarin for anticoagulation.     Procedure: upper endoscopy Date of procedure: TBD   CrCl 42 Platelet count 585   Per office protocol, patient can hold warfarin for 5 days prior to procedure. Patient will not need bridging with Lovenox (enoxaparin) around procedure.   I will route this recommendation to the requesting party via Epic fax function and remove from pre-op pool.  Please call with questions.  Lenna Sciara, NP 03/26/2022, 4:40 PM

## 2022-04-07 DIAGNOSIS — C3401 Malignant neoplasm of right main bronchus: Secondary | ICD-10-CM | POA: Diagnosis not present

## 2022-04-07 DIAGNOSIS — Z5112 Encounter for antineoplastic immunotherapy: Secondary | ICD-10-CM | POA: Diagnosis not present

## 2022-04-07 DIAGNOSIS — C7880 Secondary malignant neoplasm of unspecified digestive organ: Secondary | ICD-10-CM | POA: Diagnosis not present

## 2022-04-07 DIAGNOSIS — Z681 Body mass index (BMI) 19 or less, adult: Secondary | ICD-10-CM | POA: Diagnosis not present

## 2022-04-07 DIAGNOSIS — E039 Hypothyroidism, unspecified: Secondary | ICD-10-CM | POA: Diagnosis not present

## 2022-04-07 DIAGNOSIS — Z7901 Long term (current) use of anticoagulants: Secondary | ICD-10-CM | POA: Diagnosis not present

## 2022-04-09 DIAGNOSIS — Z79890 Hormone replacement therapy (postmenopausal): Secondary | ICD-10-CM | POA: Diagnosis not present

## 2022-04-09 DIAGNOSIS — J9859 Other diseases of mediastinum, not elsewhere classified: Secondary | ICD-10-CM | POA: Diagnosis not present

## 2022-04-09 DIAGNOSIS — C3401 Malignant neoplasm of right main bronchus: Secondary | ICD-10-CM | POA: Diagnosis not present

## 2022-04-09 DIAGNOSIS — Z5112 Encounter for antineoplastic immunotherapy: Secondary | ICD-10-CM | POA: Diagnosis not present

## 2022-04-09 DIAGNOSIS — R634 Abnormal weight loss: Secondary | ICD-10-CM | POA: Diagnosis not present

## 2022-04-11 DIAGNOSIS — E538 Deficiency of other specified B group vitamins: Secondary | ICD-10-CM | POA: Diagnosis not present

## 2022-04-11 DIAGNOSIS — I2510 Atherosclerotic heart disease of native coronary artery without angina pectoris: Secondary | ICD-10-CM | POA: Diagnosis not present

## 2022-04-11 DIAGNOSIS — J019 Acute sinusitis, unspecified: Secondary | ICD-10-CM | POA: Diagnosis not present

## 2022-04-11 DIAGNOSIS — Z0000 Encounter for general adult medical examination without abnormal findings: Secondary | ICD-10-CM | POA: Diagnosis not present

## 2022-04-11 DIAGNOSIS — C785 Secondary malignant neoplasm of large intestine and rectum: Secondary | ICD-10-CM | POA: Diagnosis not present

## 2022-04-11 DIAGNOSIS — Z681 Body mass index (BMI) 19 or less, adult: Secondary | ICD-10-CM | POA: Diagnosis not present

## 2022-04-17 ENCOUNTER — Ambulatory Visit: Payer: Medicare Other | Attending: Family Medicine | Admitting: *Deleted

## 2022-04-17 DIAGNOSIS — Z5181 Encounter for therapeutic drug level monitoring: Secondary | ICD-10-CM | POA: Diagnosis not present

## 2022-04-17 DIAGNOSIS — Z952 Presence of prosthetic heart valve: Secondary | ICD-10-CM

## 2022-04-17 LAB — POCT INR: INR: 2.5 (ref 2.0–3.0)

## 2022-04-17 NOTE — Patient Instructions (Signed)
Continue warfarin 1/2 tablet daily except 1 tablet on Sundays, Tuesdays and Thursdays Recheck in 4 wks

## 2022-04-21 DIAGNOSIS — E039 Hypothyroidism, unspecified: Secondary | ICD-10-CM | POA: Diagnosis not present

## 2022-04-21 DIAGNOSIS — J9859 Other diseases of mediastinum, not elsewhere classified: Secondary | ICD-10-CM | POA: Diagnosis not present

## 2022-04-21 DIAGNOSIS — R634 Abnormal weight loss: Secondary | ICD-10-CM | POA: Diagnosis not present

## 2022-04-21 DIAGNOSIS — C3401 Malignant neoplasm of right main bronchus: Secondary | ICD-10-CM | POA: Diagnosis not present

## 2022-04-22 DIAGNOSIS — J9859 Other diseases of mediastinum, not elsewhere classified: Secondary | ICD-10-CM | POA: Diagnosis not present

## 2022-04-22 DIAGNOSIS — C3401 Malignant neoplasm of right main bronchus: Secondary | ICD-10-CM | POA: Diagnosis not present

## 2022-04-22 DIAGNOSIS — Z5112 Encounter for antineoplastic immunotherapy: Secondary | ICD-10-CM | POA: Diagnosis not present

## 2022-04-22 DIAGNOSIS — R634 Abnormal weight loss: Secondary | ICD-10-CM | POA: Diagnosis not present

## 2022-04-22 DIAGNOSIS — R5383 Other fatigue: Secondary | ICD-10-CM | POA: Diagnosis not present

## 2022-04-22 DIAGNOSIS — Z681 Body mass index (BMI) 19 or less, adult: Secondary | ICD-10-CM | POA: Diagnosis not present

## 2022-05-05 DIAGNOSIS — C3401 Malignant neoplasm of right main bronchus: Secondary | ICD-10-CM | POA: Diagnosis not present

## 2022-05-05 DIAGNOSIS — R634 Abnormal weight loss: Secondary | ICD-10-CM | POA: Diagnosis not present

## 2022-05-05 DIAGNOSIS — E039 Hypothyroidism, unspecified: Secondary | ICD-10-CM | POA: Diagnosis not present

## 2022-05-05 DIAGNOSIS — J9859 Other diseases of mediastinum, not elsewhere classified: Secondary | ICD-10-CM | POA: Diagnosis not present

## 2022-05-05 DIAGNOSIS — Z5112 Encounter for antineoplastic immunotherapy: Secondary | ICD-10-CM | POA: Diagnosis not present

## 2022-05-06 DIAGNOSIS — J9859 Other diseases of mediastinum, not elsewhere classified: Secondary | ICD-10-CM | POA: Diagnosis not present

## 2022-05-06 DIAGNOSIS — C3401 Malignant neoplasm of right main bronchus: Secondary | ICD-10-CM | POA: Diagnosis not present

## 2022-05-06 DIAGNOSIS — Z5112 Encounter for antineoplastic immunotherapy: Secondary | ICD-10-CM | POA: Diagnosis not present

## 2022-05-15 ENCOUNTER — Ambulatory Visit: Payer: Medicare Other | Attending: Family Medicine | Admitting: *Deleted

## 2022-05-15 DIAGNOSIS — Z952 Presence of prosthetic heart valve: Secondary | ICD-10-CM | POA: Diagnosis not present

## 2022-05-15 DIAGNOSIS — Z5181 Encounter for therapeutic drug level monitoring: Secondary | ICD-10-CM

## 2022-05-15 LAB — POCT INR: INR: 3.9 — AB (ref 2.0–3.0)

## 2022-05-15 NOTE — Patient Instructions (Addendum)
Hold warfarin tonight then resume 1/2 tablet daily except 1 tablet on Sundays, Tuesdays and Thursdays Recheck in 3 wks Pending endoscopy on 11/27.  Hold warfarin 5 days--No Lovenox

## 2022-05-19 DIAGNOSIS — E039 Hypothyroidism, unspecified: Secondary | ICD-10-CM | POA: Diagnosis not present

## 2022-05-19 DIAGNOSIS — C3401 Malignant neoplasm of right main bronchus: Secondary | ICD-10-CM | POA: Diagnosis not present

## 2022-05-20 DIAGNOSIS — J9859 Other diseases of mediastinum, not elsewhere classified: Secondary | ICD-10-CM | POA: Diagnosis not present

## 2022-05-20 DIAGNOSIS — Z23 Encounter for immunization: Secondary | ICD-10-CM | POA: Diagnosis not present

## 2022-05-20 DIAGNOSIS — Z5112 Encounter for antineoplastic immunotherapy: Secondary | ICD-10-CM | POA: Diagnosis not present

## 2022-05-20 DIAGNOSIS — C3401 Malignant neoplasm of right main bronchus: Secondary | ICD-10-CM | POA: Diagnosis not present

## 2022-05-20 DIAGNOSIS — M818 Other osteoporosis without current pathological fracture: Secondary | ICD-10-CM | POA: Diagnosis not present

## 2022-05-20 DIAGNOSIS — R634 Abnormal weight loss: Secondary | ICD-10-CM | POA: Diagnosis not present

## 2022-06-02 DIAGNOSIS — C3490 Malignant neoplasm of unspecified part of unspecified bronchus or lung: Secondary | ICD-10-CM | POA: Diagnosis not present

## 2022-06-02 DIAGNOSIS — J432 Centrilobular emphysema: Secondary | ICD-10-CM | POA: Diagnosis not present

## 2022-06-02 DIAGNOSIS — K769 Liver disease, unspecified: Secondary | ICD-10-CM | POA: Diagnosis not present

## 2022-06-02 DIAGNOSIS — C3401 Malignant neoplasm of right main bronchus: Secondary | ICD-10-CM | POA: Diagnosis not present

## 2022-06-02 DIAGNOSIS — J90 Pleural effusion, not elsewhere classified: Secondary | ICD-10-CM | POA: Diagnosis not present

## 2022-06-03 ENCOUNTER — Ambulatory Visit: Payer: Medicare Other | Attending: Family Medicine | Admitting: *Deleted

## 2022-06-03 DIAGNOSIS — J9859 Other diseases of mediastinum, not elsewhere classified: Secondary | ICD-10-CM | POA: Diagnosis not present

## 2022-06-03 DIAGNOSIS — C3401 Malignant neoplasm of right main bronchus: Secondary | ICD-10-CM | POA: Diagnosis not present

## 2022-06-03 DIAGNOSIS — Z5181 Encounter for therapeutic drug level monitoring: Secondary | ICD-10-CM

## 2022-06-03 DIAGNOSIS — Z09 Encounter for follow-up examination after completed treatment for conditions other than malignant neoplasm: Secondary | ICD-10-CM | POA: Diagnosis not present

## 2022-06-03 DIAGNOSIS — Z952 Presence of prosthetic heart valve: Secondary | ICD-10-CM | POA: Diagnosis not present

## 2022-06-03 DIAGNOSIS — R634 Abnormal weight loss: Secondary | ICD-10-CM | POA: Diagnosis not present

## 2022-06-03 DIAGNOSIS — M818 Other osteoporosis without current pathological fracture: Secondary | ICD-10-CM | POA: Diagnosis not present

## 2022-06-03 DIAGNOSIS — Z5112 Encounter for antineoplastic immunotherapy: Secondary | ICD-10-CM | POA: Diagnosis not present

## 2022-06-03 DIAGNOSIS — E039 Hypothyroidism, unspecified: Secondary | ICD-10-CM | POA: Diagnosis not present

## 2022-06-03 LAB — POCT INR: INR: 3.7 — AB (ref 2.0–3.0)

## 2022-06-03 NOTE — Patient Instructions (Signed)
Hold warfarin tonight then decrease dose to 1/2 tablet daily except 1 tablet on Sundays and Thursdays Recheck in 3 wks Pending endoscopy on 11/27.  Hold warfarin 5 days--No Lovenox.  Will hold warfarin from 11/22 - 11/26.  Resume night of procedure if OK with MD.

## 2022-06-04 DIAGNOSIS — C3401 Malignant neoplasm of right main bronchus: Secondary | ICD-10-CM | POA: Diagnosis not present

## 2022-06-04 DIAGNOSIS — R634 Abnormal weight loss: Secondary | ICD-10-CM | POA: Diagnosis not present

## 2022-06-04 DIAGNOSIS — J9859 Other diseases of mediastinum, not elsewhere classified: Secondary | ICD-10-CM | POA: Diagnosis not present

## 2022-06-04 DIAGNOSIS — Z5111 Encounter for antineoplastic chemotherapy: Secondary | ICD-10-CM | POA: Diagnosis not present

## 2022-06-09 DIAGNOSIS — Z9049 Acquired absence of other specified parts of digestive tract: Secondary | ICD-10-CM | POA: Diagnosis not present

## 2022-06-09 DIAGNOSIS — I10 Essential (primary) hypertension: Secondary | ICD-10-CM | POA: Diagnosis not present

## 2022-06-09 DIAGNOSIS — I2510 Atherosclerotic heart disease of native coronary artery without angina pectoris: Secondary | ICD-10-CM | POA: Diagnosis not present

## 2022-06-09 DIAGNOSIS — I739 Peripheral vascular disease, unspecified: Secondary | ICD-10-CM | POA: Diagnosis not present

## 2022-06-09 DIAGNOSIS — J439 Emphysema, unspecified: Secondary | ICD-10-CM | POA: Diagnosis not present

## 2022-06-09 DIAGNOSIS — E039 Hypothyroidism, unspecified: Secondary | ICD-10-CM | POA: Diagnosis not present

## 2022-06-09 DIAGNOSIS — Z952 Presence of prosthetic heart valve: Secondary | ICD-10-CM | POA: Diagnosis not present

## 2022-06-09 DIAGNOSIS — K219 Gastro-esophageal reflux disease without esophagitis: Secondary | ICD-10-CM | POA: Diagnosis not present

## 2022-06-09 DIAGNOSIS — K222 Esophageal obstruction: Secondary | ICD-10-CM | POA: Diagnosis not present

## 2022-06-09 DIAGNOSIS — K449 Diaphragmatic hernia without obstruction or gangrene: Secondary | ICD-10-CM | POA: Diagnosis not present

## 2022-06-17 ENCOUNTER — Ambulatory Visit: Payer: Medicare Other | Attending: Cardiology | Admitting: *Deleted

## 2022-06-17 DIAGNOSIS — Z952 Presence of prosthetic heart valve: Secondary | ICD-10-CM

## 2022-06-17 DIAGNOSIS — Z5181 Encounter for therapeutic drug level monitoring: Secondary | ICD-10-CM

## 2022-06-17 LAB — POCT INR: INR: 2.9 (ref 2.0–3.0)

## 2022-06-17 NOTE — Patient Instructions (Signed)
Continue warfarin 1/2 tablet daily except 1 tablet on Sundays and Thursdays Recheck in 4 wks

## 2022-07-01 DIAGNOSIS — Z952 Presence of prosthetic heart valve: Secondary | ICD-10-CM | POA: Diagnosis not present

## 2022-07-01 DIAGNOSIS — K222 Esophageal obstruction: Secondary | ICD-10-CM | POA: Diagnosis not present

## 2022-07-01 DIAGNOSIS — C7880 Secondary malignant neoplasm of unspecified digestive organ: Secondary | ICD-10-CM | POA: Diagnosis not present

## 2022-07-01 DIAGNOSIS — E039 Hypothyroidism, unspecified: Secondary | ICD-10-CM | POA: Diagnosis not present

## 2022-07-01 DIAGNOSIS — C3401 Malignant neoplasm of right main bronchus: Secondary | ICD-10-CM | POA: Diagnosis not present

## 2022-07-01 DIAGNOSIS — Z09 Encounter for follow-up examination after completed treatment for conditions other than malignant neoplasm: Secondary | ICD-10-CM | POA: Diagnosis not present

## 2022-07-01 DIAGNOSIS — Z5181 Encounter for therapeutic drug level monitoring: Secondary | ICD-10-CM | POA: Diagnosis not present

## 2022-07-01 DIAGNOSIS — J431 Panlobular emphysema: Secondary | ICD-10-CM | POA: Diagnosis not present

## 2022-07-01 DIAGNOSIS — R634 Abnormal weight loss: Secondary | ICD-10-CM | POA: Diagnosis not present

## 2022-07-01 DIAGNOSIS — Z1159 Encounter for screening for other viral diseases: Secondary | ICD-10-CM | POA: Diagnosis not present

## 2022-07-02 DIAGNOSIS — R634 Abnormal weight loss: Secondary | ICD-10-CM | POA: Diagnosis not present

## 2022-07-02 DIAGNOSIS — D519 Vitamin B12 deficiency anemia, unspecified: Secondary | ICD-10-CM | POA: Diagnosis not present

## 2022-07-02 DIAGNOSIS — R030 Elevated blood-pressure reading, without diagnosis of hypertension: Secondary | ICD-10-CM | POA: Diagnosis not present

## 2022-07-02 DIAGNOSIS — J9859 Other diseases of mediastinum, not elsewhere classified: Secondary | ICD-10-CM | POA: Diagnosis not present

## 2022-07-02 DIAGNOSIS — J019 Acute sinusitis, unspecified: Secondary | ICD-10-CM | POA: Diagnosis not present

## 2022-07-02 DIAGNOSIS — Z681 Body mass index (BMI) 19 or less, adult: Secondary | ICD-10-CM | POA: Diagnosis not present

## 2022-07-02 DIAGNOSIS — J209 Acute bronchitis, unspecified: Secondary | ICD-10-CM | POA: Diagnosis not present

## 2022-07-02 DIAGNOSIS — Z5112 Encounter for antineoplastic immunotherapy: Secondary | ICD-10-CM | POA: Diagnosis not present

## 2022-07-02 DIAGNOSIS — C3401 Malignant neoplasm of right main bronchus: Secondary | ICD-10-CM | POA: Diagnosis not present

## 2022-07-10 DIAGNOSIS — Z681 Body mass index (BMI) 19 or less, adult: Secondary | ICD-10-CM | POA: Diagnosis not present

## 2022-07-10 DIAGNOSIS — R030 Elevated blood-pressure reading, without diagnosis of hypertension: Secondary | ICD-10-CM | POA: Diagnosis not present

## 2022-07-10 DIAGNOSIS — E538 Deficiency of other specified B group vitamins: Secondary | ICD-10-CM | POA: Diagnosis not present

## 2022-07-10 DIAGNOSIS — R059 Cough, unspecified: Secondary | ICD-10-CM | POA: Diagnosis not present

## 2022-07-10 DIAGNOSIS — I2510 Atherosclerotic heart disease of native coronary artery without angina pectoris: Secondary | ICD-10-CM | POA: Diagnosis not present

## 2022-07-10 DIAGNOSIS — C785 Secondary malignant neoplasm of large intestine and rectum: Secondary | ICD-10-CM | POA: Diagnosis not present

## 2022-07-15 ENCOUNTER — Ambulatory Visit: Payer: Medicare Other | Attending: Internal Medicine | Admitting: *Deleted

## 2022-07-15 DIAGNOSIS — Z952 Presence of prosthetic heart valve: Secondary | ICD-10-CM

## 2022-07-15 DIAGNOSIS — Z5181 Encounter for therapeutic drug level monitoring: Secondary | ICD-10-CM

## 2022-07-15 LAB — POCT INR: INR: 1.8 — AB (ref 2.0–3.0)

## 2022-07-15 NOTE — Patient Instructions (Signed)
Take warfarin 1 tablet tonight then resume 1/2 tablet daily except 1 tablet on Sundays and Thursdays Recheck in 3 wks

## 2022-07-29 DIAGNOSIS — E538 Deficiency of other specified B group vitamins: Secondary | ICD-10-CM | POA: Diagnosis not present

## 2022-07-29 DIAGNOSIS — Z7901 Long term (current) use of anticoagulants: Secondary | ICD-10-CM | POA: Diagnosis not present

## 2022-07-29 DIAGNOSIS — J9859 Other diseases of mediastinum, not elsewhere classified: Secondary | ICD-10-CM | POA: Diagnosis not present

## 2022-07-29 DIAGNOSIS — J431 Panlobular emphysema: Secondary | ICD-10-CM | POA: Diagnosis not present

## 2022-07-29 DIAGNOSIS — C3401 Malignant neoplasm of right main bronchus: Secondary | ICD-10-CM | POA: Diagnosis not present

## 2022-07-29 DIAGNOSIS — E039 Hypothyroidism, unspecified: Secondary | ICD-10-CM | POA: Diagnosis not present

## 2022-07-29 DIAGNOSIS — R634 Abnormal weight loss: Secondary | ICD-10-CM | POA: Diagnosis not present

## 2022-07-29 DIAGNOSIS — Z952 Presence of prosthetic heart valve: Secondary | ICD-10-CM | POA: Diagnosis not present

## 2022-07-29 DIAGNOSIS — D751 Secondary polycythemia: Secondary | ICD-10-CM | POA: Diagnosis not present

## 2022-07-29 DIAGNOSIS — K208 Other esophagitis without bleeding: Secondary | ICD-10-CM | POA: Diagnosis not present

## 2022-07-29 DIAGNOSIS — T66XXXA Radiation sickness, unspecified, initial encounter: Secondary | ICD-10-CM | POA: Diagnosis not present

## 2022-07-29 DIAGNOSIS — R030 Elevated blood-pressure reading, without diagnosis of hypertension: Secondary | ICD-10-CM | POA: Diagnosis not present

## 2022-07-31 DIAGNOSIS — D75839 Thrombocytosis, unspecified: Secondary | ICD-10-CM | POA: Diagnosis not present

## 2022-07-31 DIAGNOSIS — Z5112 Encounter for antineoplastic immunotherapy: Secondary | ICD-10-CM | POA: Diagnosis not present

## 2022-07-31 DIAGNOSIS — R634 Abnormal weight loss: Secondary | ICD-10-CM | POA: Diagnosis not present

## 2022-07-31 DIAGNOSIS — D751 Secondary polycythemia: Secondary | ICD-10-CM | POA: Diagnosis not present

## 2022-07-31 DIAGNOSIS — C3401 Malignant neoplasm of right main bronchus: Secondary | ICD-10-CM | POA: Diagnosis not present

## 2022-07-31 DIAGNOSIS — J9859 Other diseases of mediastinum, not elsewhere classified: Secondary | ICD-10-CM | POA: Diagnosis not present

## 2022-08-05 ENCOUNTER — Ambulatory Visit: Payer: Medicare Other | Attending: Internal Medicine | Admitting: *Deleted

## 2022-08-05 DIAGNOSIS — Z952 Presence of prosthetic heart valve: Secondary | ICD-10-CM | POA: Diagnosis not present

## 2022-08-05 DIAGNOSIS — Z5181 Encounter for therapeutic drug level monitoring: Secondary | ICD-10-CM | POA: Diagnosis not present

## 2022-08-05 LAB — POCT INR: INR: 2.7 (ref 2.0–3.0)

## 2022-08-05 NOTE — Patient Instructions (Signed)
Continue warfarin 1/2 tablet daily except 1 tablet on Sundays and Thursdays Recheck in 4 wks

## 2022-08-08 DIAGNOSIS — D75839 Thrombocytosis, unspecified: Secondary | ICD-10-CM | POA: Diagnosis not present

## 2022-08-08 DIAGNOSIS — D751 Secondary polycythemia: Secondary | ICD-10-CM | POA: Diagnosis not present

## 2022-08-08 DIAGNOSIS — Z5112 Encounter for antineoplastic immunotherapy: Secondary | ICD-10-CM | POA: Diagnosis not present

## 2022-08-08 DIAGNOSIS — C3401 Malignant neoplasm of right main bronchus: Secondary | ICD-10-CM | POA: Diagnosis not present

## 2022-08-26 DIAGNOSIS — J9859 Other diseases of mediastinum, not elsewhere classified: Secondary | ICD-10-CM | POA: Diagnosis not present

## 2022-08-26 DIAGNOSIS — Z1589 Genetic susceptibility to other disease: Secondary | ICD-10-CM | POA: Diagnosis not present

## 2022-08-26 DIAGNOSIS — R634 Abnormal weight loss: Secondary | ICD-10-CM | POA: Diagnosis not present

## 2022-08-26 DIAGNOSIS — M818 Other osteoporosis without current pathological fracture: Secondary | ICD-10-CM | POA: Diagnosis not present

## 2022-08-26 DIAGNOSIS — C3401 Malignant neoplasm of right main bronchus: Secondary | ICD-10-CM | POA: Diagnosis not present

## 2022-08-26 DIAGNOSIS — D471 Chronic myeloproliferative disease: Secondary | ICD-10-CM | POA: Diagnosis not present

## 2022-08-26 DIAGNOSIS — E039 Hypothyroidism, unspecified: Secondary | ICD-10-CM | POA: Diagnosis not present

## 2022-08-26 DIAGNOSIS — C7880 Secondary malignant neoplasm of unspecified digestive organ: Secondary | ICD-10-CM | POA: Diagnosis not present

## 2022-08-28 ENCOUNTER — Other Ambulatory Visit: Payer: Self-pay

## 2022-08-28 DIAGNOSIS — C3401 Malignant neoplasm of right main bronchus: Secondary | ICD-10-CM | POA: Diagnosis not present

## 2022-08-28 DIAGNOSIS — Z5112 Encounter for antineoplastic immunotherapy: Secondary | ICD-10-CM | POA: Diagnosis not present

## 2022-08-28 DIAGNOSIS — J9859 Other diseases of mediastinum, not elsewhere classified: Secondary | ICD-10-CM | POA: Diagnosis not present

## 2022-08-28 DIAGNOSIS — M818 Other osteoporosis without current pathological fracture: Secondary | ICD-10-CM | POA: Diagnosis not present

## 2022-08-28 DIAGNOSIS — R634 Abnormal weight loss: Secondary | ICD-10-CM | POA: Diagnosis not present

## 2022-09-02 ENCOUNTER — Ambulatory Visit: Payer: Medicare Other | Attending: Cardiovascular Disease | Admitting: *Deleted

## 2022-09-02 DIAGNOSIS — E039 Hypothyroidism, unspecified: Secondary | ICD-10-CM | POA: Diagnosis not present

## 2022-09-02 DIAGNOSIS — Z952 Presence of prosthetic heart valve: Secondary | ICD-10-CM | POA: Diagnosis not present

## 2022-09-02 DIAGNOSIS — M25461 Effusion, right knee: Secondary | ICD-10-CM | POA: Diagnosis not present

## 2022-09-02 DIAGNOSIS — J90 Pleural effusion, not elsewhere classified: Secondary | ICD-10-CM | POA: Diagnosis not present

## 2022-09-02 DIAGNOSIS — D471 Chronic myeloproliferative disease: Secondary | ICD-10-CM | POA: Diagnosis not present

## 2022-09-02 DIAGNOSIS — Z5181 Encounter for therapeutic drug level monitoring: Secondary | ICD-10-CM | POA: Insufficient documentation

## 2022-09-02 DIAGNOSIS — Z1589 Genetic susceptibility to other disease: Secondary | ICD-10-CM | POA: Diagnosis not present

## 2022-09-02 DIAGNOSIS — C7880 Secondary malignant neoplasm of unspecified digestive organ: Secondary | ICD-10-CM | POA: Diagnosis not present

## 2022-09-02 DIAGNOSIS — C3491 Malignant neoplasm of unspecified part of right bronchus or lung: Secondary | ICD-10-CM | POA: Diagnosis not present

## 2022-09-02 DIAGNOSIS — J9381 Chronic pneumothorax: Secondary | ICD-10-CM | POA: Diagnosis not present

## 2022-09-02 DIAGNOSIS — Z90710 Acquired absence of both cervix and uterus: Secondary | ICD-10-CM | POA: Diagnosis not present

## 2022-09-02 DIAGNOSIS — J984 Other disorders of lung: Secondary | ICD-10-CM | POA: Diagnosis not present

## 2022-09-02 DIAGNOSIS — I700 Atherosclerosis of aorta: Secondary | ICD-10-CM | POA: Diagnosis not present

## 2022-09-02 LAB — POCT INR: INR: 1.9 — AB (ref 2.0–3.0)

## 2022-09-02 NOTE — Patient Instructions (Signed)
Continue warfarin 1/2 tablet daily except 1 tablet on Sundays and Thursdays Recheck in 3 wks

## 2022-09-05 DIAGNOSIS — Z95828 Presence of other vascular implants and grafts: Secondary | ICD-10-CM | POA: Diagnosis not present

## 2022-09-05 DIAGNOSIS — J948 Other specified pleural conditions: Secondary | ICD-10-CM | POA: Diagnosis not present

## 2022-09-05 DIAGNOSIS — R0602 Shortness of breath: Secondary | ICD-10-CM | POA: Diagnosis not present

## 2022-09-05 DIAGNOSIS — I2510 Atherosclerotic heart disease of native coronary artery without angina pectoris: Secondary | ICD-10-CM | POA: Diagnosis not present

## 2022-09-05 DIAGNOSIS — C3490 Malignant neoplasm of unspecified part of unspecified bronchus or lung: Secondary | ICD-10-CM | POA: Diagnosis not present

## 2022-09-05 DIAGNOSIS — F17200 Nicotine dependence, unspecified, uncomplicated: Secondary | ICD-10-CM | POA: Diagnosis not present

## 2022-09-05 DIAGNOSIS — E039 Hypothyroidism, unspecified: Secondary | ICD-10-CM | POA: Diagnosis not present

## 2022-09-05 DIAGNOSIS — J432 Centrilobular emphysema: Secondary | ICD-10-CM | POA: Diagnosis not present

## 2022-09-05 DIAGNOSIS — K219 Gastro-esophageal reflux disease without esophagitis: Secondary | ICD-10-CM | POA: Diagnosis not present

## 2022-09-05 DIAGNOSIS — K769 Liver disease, unspecified: Secondary | ICD-10-CM | POA: Diagnosis not present

## 2022-09-05 DIAGNOSIS — F17210 Nicotine dependence, cigarettes, uncomplicated: Secondary | ICD-10-CM | POA: Diagnosis not present

## 2022-09-05 DIAGNOSIS — C7880 Secondary malignant neoplasm of unspecified digestive organ: Secondary | ICD-10-CM | POA: Diagnosis not present

## 2022-09-05 DIAGNOSIS — R0603 Acute respiratory distress: Secondary | ICD-10-CM | POA: Diagnosis not present

## 2022-09-05 DIAGNOSIS — J939 Pneumothorax, unspecified: Secondary | ICD-10-CM | POA: Diagnosis not present

## 2022-09-05 DIAGNOSIS — Z79890 Hormone replacement therapy (postmenopausal): Secondary | ICD-10-CM | POA: Diagnosis not present

## 2022-09-05 DIAGNOSIS — D471 Chronic myeloproliferative disease: Secondary | ICD-10-CM | POA: Diagnosis not present

## 2022-09-05 DIAGNOSIS — I700 Atherosclerosis of aorta: Secondary | ICD-10-CM | POA: Diagnosis not present

## 2022-09-05 DIAGNOSIS — R928 Other abnormal and inconclusive findings on diagnostic imaging of breast: Secondary | ICD-10-CM | POA: Diagnosis not present

## 2022-09-05 DIAGNOSIS — R64 Cachexia: Secondary | ICD-10-CM | POA: Diagnosis not present

## 2022-09-05 DIAGNOSIS — J984 Other disorders of lung: Secondary | ICD-10-CM | POA: Diagnosis not present

## 2022-09-05 DIAGNOSIS — R0600 Dyspnea, unspecified: Secondary | ICD-10-CM | POA: Diagnosis not present

## 2022-09-05 DIAGNOSIS — K222 Esophageal obstruction: Secondary | ICD-10-CM | POA: Diagnosis not present

## 2022-09-05 DIAGNOSIS — J90 Pleural effusion, not elsewhere classified: Secondary | ICD-10-CM | POA: Diagnosis not present

## 2022-09-05 DIAGNOSIS — R0902 Hypoxemia: Secondary | ICD-10-CM | POA: Diagnosis not present

## 2022-09-05 DIAGNOSIS — C785 Secondary malignant neoplasm of large intestine and rectum: Secondary | ICD-10-CM | POA: Diagnosis not present

## 2022-09-05 DIAGNOSIS — Z79620 Long term (current) use of immunosuppressive biologic: Secondary | ICD-10-CM | POA: Diagnosis not present

## 2022-09-05 DIAGNOSIS — Z85038 Personal history of other malignant neoplasm of large intestine: Secondary | ICD-10-CM | POA: Diagnosis not present

## 2022-09-05 DIAGNOSIS — Z79899 Other long term (current) drug therapy: Secondary | ICD-10-CM | POA: Diagnosis not present

## 2022-09-05 DIAGNOSIS — Z90710 Acquired absence of both cervix and uterus: Secondary | ICD-10-CM | POA: Diagnosis not present

## 2022-09-05 DIAGNOSIS — C786 Secondary malignant neoplasm of retroperitoneum and peritoneum: Secondary | ICD-10-CM | POA: Diagnosis not present

## 2022-09-05 DIAGNOSIS — C3491 Malignant neoplasm of unspecified part of right bronchus or lung: Secondary | ICD-10-CM | POA: Diagnosis not present

## 2022-09-05 DIAGNOSIS — Z1589 Genetic susceptibility to other disease: Secondary | ICD-10-CM | POA: Diagnosis not present

## 2022-09-05 DIAGNOSIS — Z681 Body mass index (BMI) 19 or less, adult: Secondary | ICD-10-CM | POA: Diagnosis not present

## 2022-09-05 DIAGNOSIS — J449 Chronic obstructive pulmonary disease, unspecified: Secondary | ICD-10-CM | POA: Diagnosis not present

## 2022-09-05 DIAGNOSIS — Z7901 Long term (current) use of anticoagulants: Secondary | ICD-10-CM | POA: Diagnosis not present

## 2022-09-05 DIAGNOSIS — R918 Other nonspecific abnormal finding of lung field: Secondary | ICD-10-CM | POA: Diagnosis not present

## 2022-09-05 DIAGNOSIS — Z9089 Acquired absence of other organs: Secondary | ICD-10-CM | POA: Diagnosis not present

## 2022-09-05 DIAGNOSIS — J9601 Acute respiratory failure with hypoxia: Secondary | ICD-10-CM | POA: Diagnosis not present

## 2022-09-05 DIAGNOSIS — Z7982 Long term (current) use of aspirin: Secondary | ICD-10-CM | POA: Diagnosis not present

## 2022-09-05 DIAGNOSIS — J860 Pyothorax with fistula: Secondary | ICD-10-CM | POA: Diagnosis not present

## 2022-09-05 DIAGNOSIS — Z952 Presence of prosthetic heart valve: Secondary | ICD-10-CM | POA: Diagnosis not present

## 2022-09-05 DIAGNOSIS — I10 Essential (primary) hypertension: Secondary | ICD-10-CM | POA: Diagnosis not present

## 2022-09-05 DIAGNOSIS — Z1152 Encounter for screening for COVID-19: Secondary | ICD-10-CM | POA: Diagnosis not present

## 2022-09-23 DIAGNOSIS — J948 Other specified pleural conditions: Secondary | ICD-10-CM | POA: Diagnosis not present

## 2022-09-23 DIAGNOSIS — C3401 Malignant neoplasm of right main bronchus: Secondary | ICD-10-CM | POA: Diagnosis not present

## 2022-09-23 DIAGNOSIS — J9859 Other diseases of mediastinum, not elsewhere classified: Secondary | ICD-10-CM | POA: Diagnosis not present

## 2022-09-23 DIAGNOSIS — Z5112 Encounter for antineoplastic immunotherapy: Secondary | ICD-10-CM | POA: Diagnosis not present

## 2022-09-23 DIAGNOSIS — R634 Abnormal weight loss: Secondary | ICD-10-CM | POA: Diagnosis not present

## 2022-09-23 DIAGNOSIS — C7880 Secondary malignant neoplasm of unspecified digestive organ: Secondary | ICD-10-CM | POA: Diagnosis not present

## 2022-09-23 DIAGNOSIS — S22000A Wedge compression fracture of unspecified thoracic vertebra, initial encounter for closed fracture: Secondary | ICD-10-CM | POA: Diagnosis not present

## 2022-09-24 ENCOUNTER — Ambulatory Visit: Payer: Medicare Other | Attending: Cardiovascular Disease | Admitting: *Deleted

## 2022-09-24 DIAGNOSIS — Z952 Presence of prosthetic heart valve: Secondary | ICD-10-CM

## 2022-09-24 DIAGNOSIS — Z5181 Encounter for therapeutic drug level monitoring: Secondary | ICD-10-CM | POA: Diagnosis not present

## 2022-09-24 LAB — POCT INR: INR: 3.2 — AB (ref 2.0–3.0)

## 2022-09-24 NOTE — Patient Instructions (Signed)
Continue warfarin 1/2 tablet daily except 1 tablet on Sundays and Thursdays Pending endoscopy at [facility] on 3/18.  Will hold warfarin 5 days before procedure. Took last dose 3/12.  Restart warfarin after procedure taking extra 1/2 tablet x 2 days then resume above dose. Recheck in 3 wks

## 2022-09-25 DIAGNOSIS — C3401 Malignant neoplasm of right main bronchus: Secondary | ICD-10-CM | POA: Diagnosis not present

## 2022-09-25 DIAGNOSIS — J9859 Other diseases of mediastinum, not elsewhere classified: Secondary | ICD-10-CM | POA: Diagnosis not present

## 2022-09-25 DIAGNOSIS — Z5112 Encounter for antineoplastic immunotherapy: Secondary | ICD-10-CM | POA: Diagnosis not present

## 2022-09-25 DIAGNOSIS — R634 Abnormal weight loss: Secondary | ICD-10-CM | POA: Diagnosis not present

## 2022-09-29 DIAGNOSIS — Z7901 Long term (current) use of anticoagulants: Secondary | ICD-10-CM | POA: Diagnosis not present

## 2022-09-29 DIAGNOSIS — Z9221 Personal history of antineoplastic chemotherapy: Secondary | ICD-10-CM | POA: Diagnosis not present

## 2022-09-29 DIAGNOSIS — Z85118 Personal history of other malignant neoplasm of bronchus and lung: Secondary | ICD-10-CM | POA: Diagnosis not present

## 2022-09-29 DIAGNOSIS — K219 Gastro-esophageal reflux disease without esophagitis: Secondary | ICD-10-CM | POA: Diagnosis not present

## 2022-09-29 DIAGNOSIS — Z7982 Long term (current) use of aspirin: Secondary | ICD-10-CM | POA: Diagnosis not present

## 2022-09-29 DIAGNOSIS — R1310 Dysphagia, unspecified: Secondary | ICD-10-CM | POA: Diagnosis not present

## 2022-09-29 DIAGNOSIS — I739 Peripheral vascular disease, unspecified: Secondary | ICD-10-CM | POA: Diagnosis not present

## 2022-09-29 DIAGNOSIS — I10 Essential (primary) hypertension: Secondary | ICD-10-CM | POA: Diagnosis not present

## 2022-09-29 DIAGNOSIS — E039 Hypothyroidism, unspecified: Secondary | ICD-10-CM | POA: Diagnosis not present

## 2022-09-29 DIAGNOSIS — Z923 Personal history of irradiation: Secondary | ICD-10-CM | POA: Diagnosis not present

## 2022-09-29 DIAGNOSIS — I252 Old myocardial infarction: Secondary | ICD-10-CM | POA: Diagnosis not present

## 2022-09-29 DIAGNOSIS — K222 Esophageal obstruction: Secondary | ICD-10-CM | POA: Diagnosis not present

## 2022-09-29 DIAGNOSIS — K449 Diaphragmatic hernia without obstruction or gangrene: Secondary | ICD-10-CM | POA: Diagnosis not present

## 2022-09-29 DIAGNOSIS — I2510 Atherosclerotic heart disease of native coronary artery without angina pectoris: Secondary | ICD-10-CM | POA: Diagnosis not present

## 2022-10-07 DIAGNOSIS — C3491 Malignant neoplasm of unspecified part of right bronchus or lung: Secondary | ICD-10-CM | POA: Diagnosis not present

## 2022-10-07 DIAGNOSIS — R0602 Shortness of breath: Secondary | ICD-10-CM | POA: Diagnosis not present

## 2022-10-07 DIAGNOSIS — J449 Chronic obstructive pulmonary disease, unspecified: Secondary | ICD-10-CM | POA: Diagnosis not present

## 2022-10-07 DIAGNOSIS — Z681 Body mass index (BMI) 19 or less, adult: Secondary | ICD-10-CM | POA: Diagnosis not present

## 2022-10-08 ENCOUNTER — Telehealth: Payer: Self-pay | Admitting: *Deleted

## 2022-10-08 ENCOUNTER — Ambulatory Visit: Payer: Medicare Other | Attending: Cardiovascular Disease | Admitting: *Deleted

## 2022-10-08 DIAGNOSIS — Z952 Presence of prosthetic heart valve: Secondary | ICD-10-CM | POA: Insufficient documentation

## 2022-10-08 DIAGNOSIS — Z5181 Encounter for therapeutic drug level monitoring: Secondary | ICD-10-CM

## 2022-10-08 LAB — POCT INR: INR: 3.7 — AB (ref 2.0–3.0)

## 2022-10-08 NOTE — Telephone Encounter (Signed)
  Pt is requesting to speak with Judy Sawyer regarding her coumadin

## 2022-10-08 NOTE — Telephone Encounter (Signed)
Pt needs to reschedule appt.  Appt moved to 10/21/22 at 3:15pm.

## 2022-10-08 NOTE — Patient Instructions (Signed)
Hold warfarin tonight then decrease dose to 1/2 tablet daily except 1 tablet on Sundays Recheck in 2 wks

## 2022-10-21 ENCOUNTER — Ambulatory Visit: Payer: Medicare Other | Attending: Cardiovascular Disease | Admitting: *Deleted

## 2022-10-21 DIAGNOSIS — Z5181 Encounter for therapeutic drug level monitoring: Secondary | ICD-10-CM | POA: Diagnosis not present

## 2022-10-21 DIAGNOSIS — Z952 Presence of prosthetic heart valve: Secondary | ICD-10-CM | POA: Diagnosis not present

## 2022-10-21 LAB — POCT INR: INR: 3.1 — AB (ref 2.0–3.0)

## 2022-10-21 NOTE — Patient Instructions (Signed)
Continue warfarin 1/2 tablet daily except 1 tablet on Sundays Increase Vit K foods Recheck in 3 wks

## 2022-10-22 DIAGNOSIS — J9859 Other diseases of mediastinum, not elsewhere classified: Secondary | ICD-10-CM | POA: Diagnosis not present

## 2022-10-22 DIAGNOSIS — C7880 Secondary malignant neoplasm of unspecified digestive organ: Secondary | ICD-10-CM | POA: Diagnosis not present

## 2022-10-22 DIAGNOSIS — M810 Age-related osteoporosis without current pathological fracture: Secondary | ICD-10-CM | POA: Diagnosis not present

## 2022-10-22 DIAGNOSIS — R634 Abnormal weight loss: Secondary | ICD-10-CM | POA: Diagnosis not present

## 2022-10-22 DIAGNOSIS — D471 Chronic myeloproliferative disease: Secondary | ICD-10-CM | POA: Diagnosis not present

## 2022-10-22 DIAGNOSIS — C3401 Malignant neoplasm of right main bronchus: Secondary | ICD-10-CM | POA: Diagnosis not present

## 2022-10-22 DIAGNOSIS — E039 Hypothyroidism, unspecified: Secondary | ICD-10-CM | POA: Diagnosis not present

## 2022-10-22 DIAGNOSIS — Z1589 Genetic susceptibility to other disease: Secondary | ICD-10-CM | POA: Diagnosis not present

## 2022-10-23 DIAGNOSIS — C3401 Malignant neoplasm of right main bronchus: Secondary | ICD-10-CM | POA: Diagnosis not present

## 2022-10-23 DIAGNOSIS — R634 Abnormal weight loss: Secondary | ICD-10-CM | POA: Diagnosis not present

## 2022-10-23 DIAGNOSIS — J9859 Other diseases of mediastinum, not elsewhere classified: Secondary | ICD-10-CM | POA: Diagnosis not present

## 2022-10-23 DIAGNOSIS — Z5112 Encounter for antineoplastic immunotherapy: Secondary | ICD-10-CM | POA: Diagnosis not present

## 2022-11-11 ENCOUNTER — Ambulatory Visit: Payer: Medicare Other | Attending: Cardiovascular Disease | Admitting: *Deleted

## 2022-11-11 DIAGNOSIS — Z952 Presence of prosthetic heart valve: Secondary | ICD-10-CM | POA: Diagnosis not present

## 2022-11-11 DIAGNOSIS — Z5181 Encounter for therapeutic drug level monitoring: Secondary | ICD-10-CM | POA: Diagnosis not present

## 2022-11-11 LAB — POCT INR: INR: 3.7 — AB (ref 2.0–3.0)

## 2022-11-11 NOTE — Patient Instructions (Signed)
Hold warfarin tonight then decrease dose to 1/2 tablet daily  Increase Vit K foods Recheck in 3 wks

## 2022-11-26 DIAGNOSIS — C3401 Malignant neoplasm of right main bronchus: Secondary | ICD-10-CM | POA: Diagnosis not present

## 2022-11-26 DIAGNOSIS — C7880 Secondary malignant neoplasm of unspecified digestive organ: Secondary | ICD-10-CM | POA: Diagnosis not present

## 2022-11-26 DIAGNOSIS — R634 Abnormal weight loss: Secondary | ICD-10-CM | POA: Diagnosis not present

## 2022-11-26 DIAGNOSIS — D471 Chronic myeloproliferative disease: Secondary | ICD-10-CM | POA: Diagnosis not present

## 2022-11-26 DIAGNOSIS — E039 Hypothyroidism, unspecified: Secondary | ICD-10-CM | POA: Diagnosis not present

## 2022-11-26 DIAGNOSIS — J9859 Other diseases of mediastinum, not elsewhere classified: Secondary | ICD-10-CM | POA: Diagnosis not present

## 2022-11-27 DIAGNOSIS — J9859 Other diseases of mediastinum, not elsewhere classified: Secondary | ICD-10-CM | POA: Diagnosis not present

## 2022-11-27 DIAGNOSIS — C3401 Malignant neoplasm of right main bronchus: Secondary | ICD-10-CM | POA: Diagnosis not present

## 2022-11-27 DIAGNOSIS — R634 Abnormal weight loss: Secondary | ICD-10-CM | POA: Diagnosis not present

## 2022-11-27 DIAGNOSIS — Z5112 Encounter for antineoplastic immunotherapy: Secondary | ICD-10-CM | POA: Diagnosis not present

## 2022-12-02 ENCOUNTER — Ambulatory Visit: Payer: Medicare Other | Attending: Cardiovascular Disease | Admitting: *Deleted

## 2022-12-02 DIAGNOSIS — Z5181 Encounter for therapeutic drug level monitoring: Secondary | ICD-10-CM | POA: Insufficient documentation

## 2022-12-02 DIAGNOSIS — Z952 Presence of prosthetic heart valve: Secondary | ICD-10-CM | POA: Insufficient documentation

## 2022-12-02 LAB — POCT INR: INR: 2.9 (ref 2.0–3.0)

## 2022-12-02 NOTE — Patient Instructions (Signed)
Continue warfarin 1/2 tablet daily  Increase Vit K foods Recheck 1 week after procedure.  Having endoscopy middle of June.  Pt knows to hold warfarin 5 days before procedure and resume night of procedure.  Has these every 3 months

## 2022-12-15 ENCOUNTER — Ambulatory Visit: Payer: Medicare Other | Attending: Internal Medicine | Admitting: Internal Medicine

## 2022-12-15 ENCOUNTER — Telehealth: Payer: Self-pay

## 2022-12-15 ENCOUNTER — Encounter: Payer: Self-pay | Admitting: Internal Medicine

## 2022-12-15 VITALS — BP 104/62 | HR 90 | Ht 63.0 in | Wt 102.4 lb

## 2022-12-15 DIAGNOSIS — Z952 Presence of prosthetic heart valve: Secondary | ICD-10-CM | POA: Insufficient documentation

## 2022-12-15 DIAGNOSIS — I7121 Aneurysm of the ascending aorta, without rupture: Secondary | ICD-10-CM

## 2022-12-15 DIAGNOSIS — I10 Essential (primary) hypertension: Secondary | ICD-10-CM

## 2022-12-15 NOTE — Telephone Encounter (Signed)
Per Dr. Jenene Slicker after visit. Can you obtain CTA aorta now to monitor for ascending aortic aneurysm please.

## 2022-12-15 NOTE — Patient Instructions (Addendum)
Medication Instructions:  Your physician recommends that you continue on your current medications as directed. Please refer to the Current Medication list given to you today.  Labwork: none  Testing/Procedures: Your physician has requested that you have an echocardiogram. Echocardiography is a painless test that uses sound waves to create images of your heart. It provides your doctor with information about the size and shape of your heart and how well your heart's chambers and valves are working. This procedure takes approximately one hour. There are no restrictions for this procedure. Please do NOT wear cologne, perfume, aftershave, or lotions (deodorant is allowed). Please arrive 15 minutes prior to your appointment time.  Follow-Up: Your physician recommends that you schedule a follow-up appointment in: 1 year. You will receive a reminder call in the mail in about 10 months reminding you to call and schedule your appointment. If you don't receive this call, please contact our office.  Any Other Special Instructions Will Be Listed Below (If Applicable).  If you need a refill on your cardiac medications before your next appointment, please call your pharmacy. 

## 2022-12-15 NOTE — Progress Notes (Signed)
Cardiology Office Note  Date: 12/15/2022   ID: Judy Sawyer, DOB June 28, 1956, MRN [id number]  PCP:  Lawerance Sabal, PA  Cardiologist:  Marjo Bicker, MD Electrophysiologist:  None   Reason for Office Visit: Follow-up of aortic valve replacement and ascending aortic aneurysm   History of Present Illness: Judy Sawyer is a 67 y.o. 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  Echocardiogram from 2022 showed normal functioning of aortic valve prosthesis with mild A2 valve regurgitation.  Normal LVEF.  No bleeding symptoms.  Compliant with medications.  No chest pain or DOE.  No dizziness, palpitations, leg swelling.  Past Medical History:  Diagnosis Date   Acute bronchitis    Anemia, unspecified    Aortic stenosis    previous cardiologisit: Dr. Titus Dubin in [place], [place]   Cancer [facility] [place])    Carotid stenosis 05/13   mild less than 50% bilaterally   Chest pain    Collagen vascular disease (HCC)    COPD (chronic obstructive pulmonary disease) (HCC)    Fibromyalgia    GERD (gastroesophageal reflux disease)    Hypertension    Murmur    Tobacco abuse     Past Surgical History:  Procedure Laterality Date   ABDOMINAL HYSTERECTOMY     AORTIC VALVE REPLACEMENT  11/2011   in [place]. St. Jude 19 mm   COLONOSCOPY     KNEE ARTHROSCOPY WITH LATERAL MENISECTOMY Left 08/16/2014   Procedure: LEFT KNEE ARTHROSCOPY WITH LATERAL MENISECTOMY;  Surgeon: Nilda Simmer, MD;  Location: [facility];  Service: Orthopedics;  Laterality: Left;   KNEE ARTHROSCOPY WITH MEDIAL MENISECTOMY Left 08/16/2014   Procedure: KNEE ARTHROSCOPY WITH MEDIAL MENISECTOMY;  Surgeon: Nilda Simmer, MD;  Location: [facility];  Service: Orthopedics;  Laterality: Left;   TONSILLECTOMY     UPPER  GI ENDOSCOPY      Current Outpatient Medications  Medication Sig Dispense Refill   aspirin EC 81 MG tablet Take 81 mg by mouth daily.     Cholecalciferol (VITAMIN D3) 1000 units CAPS Take 1 capsule by mouth daily.     fluticasone (FLONASE) 50 MCG/ACT nasal spray Place into both nostrils as needed for allergies or rhinitis.     HYDROcodone-acetaminophen (NORCO) 7.5-325 MG tablet Take 1 tablet by mouth daily.     ipilimumab (YERVOY) 200 MG/40ML SOLN Inject into the vein.     levothyroxine (SYNTHROID) 200 MCG tablet Take 200 mcg by mouth daily.     levothyroxine (SYNTHROID) 25 MCG tablet Take 1 tablet by mouth in the morning and at bedtime.     Melatonin 5 MG TABS Take 1 tablet by mouth daily.     metoprolol tartrate (LOPRESSOR) 25 MG tablet Take 1/2 (one-half) tablet by mouth twice daily 90 tablet 2   NIVOLUMAB IV Inject into the vein.     pantoprazole (PROTONIX) 40 MG tablet Take 40 mg by mouth 2 (two) times daily.      prochlorperazine (COMPAZINE) 10 MG tablet Take 10 mg by mouth every 6 (six) hours as needed for nausea or vomiting.     Sennosides 8.6 MG CAPS Take 1 capsule by mouth daily as needed.      warfarin (COUMADIN) 2 MG tablet TAKE 1 TABLET BY MOUTH ONCE DAILY OR  AS  DIRECTED  BY  THE  COUMADIN  CLINIC 35 tablet 5   No current facility-administered medications for this visit.   Allergies:  Codeine   Social History: The patient  reports that she quit smoking about 5 years ago. Her smoking use included cigarettes. She started smoking about 53 years ago. She has a 21.00 pack-year smoking history. She has never used smokeless tobacco. She reports that she does not drink alcohol and does not use drugs.   Family History: The patient's family history includes Cancer in her mother; Diabetes type II in her sister; Emphysema in her father; Heart attack in her mother; Heart disease in her sister; Liver disease in her father; Rheumatic fever in her sister.   ROS:  Please see the history of  present illness. Otherwise, complete review of systems is positive for none  All other systems are reviewed and negative.   Physical Exam: VS:  Ht 5\' 3"  (1.6 m)   BMI 18.49 kg/m , BMI Body mass index is 18.49 kg/m.  Wt Readings from Last 3 Encounters:  11/18/21 104 lb 6.4 oz (47.4 kg)  07/31/21 105 lb 12.8 oz (48 kg)  07/19/21 104 lb (47.2 kg)    General: Patient appears comfortable at rest. HEENT: Conjunctiva and lids normal, oropharynx clear with moist mucosa. Neck: Supple, no elevated JVP or carotid bruits, no thyromegaly. Lungs: Clear to auscultation, nonlabored breathing at rest. Cardiac: Regular rate and rhythm, no S3 or significant systolic murmur, no pericardial rub. Abdomen: Soft, nontender, no hepatomegaly, bowel sounds present, no guarding or rebound. Extremities: No pitting edema, distal pulses 2+. Skin: Warm and dry. Musculoskeletal: No kyphosis. Neuropsychiatric: Alert and oriented x3, affect grossly appropriate.  Recent Labwork: No results found for requested labs within last 365 days.  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  # History of mechanical aortic valve replacement with a 19 mm Saint Jude mechanical valve in 2013 -Continue Coumadin with goal INR between 2 and 3 -Has dentures, follow-up with dentist -SBE prophylaxis prior to dental procedures -Obtain 2D echocardiogram (due to mild aortic valve regurgitation in 2022)  # Ascending aortic aneurysm -43 mm in 2019 and 43 mm in 2022. Obtain CTA aorta.   I have spent a total of 30 minutes with patient reviewing chart, EKGs, labs and examining patient as well as establishing an assessment and  plan that was discussed with the patient.  > 50% of time was spent in direct patient care.    Medication Adjustments/Labs and Tests Ordered: Current medicines are reviewed at length with the patient today.  Concerns regarding medicines are outlined above.   Tests Ordered: Orders Placed This Encounter  Procedures   EKG 12-Lead    Medication Changes: No orders of the defined types were placed in this encounter.   Disposition:  Follow up  one year  Signed Chandra Feger Verne Spurr, MD, 12/15/2022 3:06 PM    [facility] [address] [place], [place], [place] [address]

## 2022-12-16 NOTE — Telephone Encounter (Signed)
Called patient to inform that a test has been order by Dr. Jenene Slicker after her visit yesterday. CTA

## 2022-12-17 DIAGNOSIS — C7880 Secondary malignant neoplasm of unspecified digestive organ: Secondary | ICD-10-CM | POA: Diagnosis not present

## 2022-12-17 DIAGNOSIS — C3491 Malignant neoplasm of unspecified part of right bronchus or lung: Secondary | ICD-10-CM | POA: Diagnosis not present

## 2022-12-17 DIAGNOSIS — C3401 Malignant neoplasm of right main bronchus: Secondary | ICD-10-CM | POA: Diagnosis not present

## 2022-12-17 DIAGNOSIS — E039 Hypothyroidism, unspecified: Secondary | ICD-10-CM | POA: Diagnosis not present

## 2022-12-23 ENCOUNTER — Ambulatory Visit (HOSPITAL_COMMUNITY)
Admission: RE | Admit: 2022-12-23 | Discharge: 2022-12-23 | Disposition: A | Discharge status: Home / Self Care | Payer: Medicare Other | Source: Ambulatory Visit | Attending: Internal Medicine | Admitting: Internal Medicine

## 2022-12-23 DIAGNOSIS — I7121 Aneurysm of the ascending aorta, without rupture: Secondary | ICD-10-CM | POA: Diagnosis not present

## 2022-12-23 DIAGNOSIS — J90 Pleural effusion, not elsewhere classified: Secondary | ICD-10-CM | POA: Diagnosis not present

## 2022-12-23 DIAGNOSIS — J439 Emphysema, unspecified: Secondary | ICD-10-CM | POA: Diagnosis not present

## 2022-12-23 LAB — POCT I-STAT CREATININE: Creatinine, Ser: 1.1 mg/dL — ABNORMAL HIGH (ref 0.44–1.00)

## 2022-12-23 MED ORDER — IOHEXOL 350 MG/ML SOLN
75.0000 mL | Freq: Once | INTRAVENOUS | Status: AC | PRN
  Administered 2022-12-23: 75 mL via INTRAVENOUS

## 2022-12-25 DIAGNOSIS — J9859 Other diseases of mediastinum, not elsewhere classified: Secondary | ICD-10-CM | POA: Diagnosis not present

## 2022-12-25 DIAGNOSIS — Z5112 Encounter for antineoplastic immunotherapy: Secondary | ICD-10-CM | POA: Diagnosis not present

## 2022-12-25 DIAGNOSIS — C3401 Malignant neoplasm of right main bronchus: Secondary | ICD-10-CM | POA: Diagnosis not present

## 2022-12-25 DIAGNOSIS — R634 Abnormal weight loss: Secondary | ICD-10-CM | POA: Diagnosis not present

## 2023-01-01 DIAGNOSIS — Z87891 Personal history of nicotine dependence: Secondary | ICD-10-CM | POA: Diagnosis not present

## 2023-01-01 DIAGNOSIS — J439 Emphysema, unspecified: Secondary | ICD-10-CM | POA: Diagnosis not present

## 2023-01-01 DIAGNOSIS — Z79899 Other long term (current) drug therapy: Secondary | ICD-10-CM | POA: Diagnosis not present

## 2023-01-01 DIAGNOSIS — I2510 Atherosclerotic heart disease of native coronary artery without angina pectoris: Secondary | ICD-10-CM | POA: Diagnosis not present

## 2023-01-01 DIAGNOSIS — K449 Diaphragmatic hernia without obstruction or gangrene: Secondary | ICD-10-CM | POA: Diagnosis not present

## 2023-01-01 DIAGNOSIS — K222 Esophageal obstruction: Secondary | ICD-10-CM | POA: Diagnosis not present

## 2023-01-01 DIAGNOSIS — Z79890 Hormone replacement therapy (postmenopausal): Secondary | ICD-10-CM | POA: Diagnosis not present

## 2023-01-01 DIAGNOSIS — I252 Old myocardial infarction: Secondary | ICD-10-CM | POA: Diagnosis not present

## 2023-01-01 DIAGNOSIS — Z952 Presence of prosthetic heart valve: Secondary | ICD-10-CM | POA: Diagnosis not present

## 2023-01-01 DIAGNOSIS — Z7982 Long term (current) use of aspirin: Secondary | ICD-10-CM | POA: Diagnosis not present

## 2023-01-01 DIAGNOSIS — Z7901 Long term (current) use of anticoagulants: Secondary | ICD-10-CM | POA: Diagnosis not present

## 2023-01-01 DIAGNOSIS — E039 Hypothyroidism, unspecified: Secondary | ICD-10-CM | POA: Diagnosis not present

## 2023-01-01 DIAGNOSIS — I10 Essential (primary) hypertension: Secondary | ICD-10-CM | POA: Diagnosis not present

## 2023-01-01 DIAGNOSIS — K219 Gastro-esophageal reflux disease without esophagitis: Secondary | ICD-10-CM | POA: Diagnosis not present

## 2023-01-07 DIAGNOSIS — K219 Gastro-esophageal reflux disease without esophagitis: Secondary | ICD-10-CM | POA: Diagnosis not present

## 2023-01-07 DIAGNOSIS — R030 Elevated blood-pressure reading, without diagnosis of hypertension: Secondary | ICD-10-CM | POA: Diagnosis not present

## 2023-01-07 DIAGNOSIS — Z681 Body mass index (BMI) 19 or less, adult: Secondary | ICD-10-CM | POA: Diagnosis not present

## 2023-01-07 DIAGNOSIS — E538 Deficiency of other specified B group vitamins: Secondary | ICD-10-CM | POA: Diagnosis not present

## 2023-01-07 DIAGNOSIS — I2510 Atherosclerotic heart disease of native coronary artery without angina pectoris: Secondary | ICD-10-CM | POA: Diagnosis not present

## 2023-01-07 DIAGNOSIS — C785 Secondary malignant neoplasm of large intestine and rectum: Secondary | ICD-10-CM | POA: Diagnosis not present

## 2023-01-08 ENCOUNTER — Ambulatory Visit: Payer: Medicare Other | Attending: Cardiovascular Disease | Admitting: *Deleted

## 2023-01-08 DIAGNOSIS — Z5181 Encounter for therapeutic drug level monitoring: Secondary | ICD-10-CM

## 2023-01-08 DIAGNOSIS — Z952 Presence of prosthetic heart valve: Secondary | ICD-10-CM | POA: Diagnosis not present

## 2023-01-08 LAB — POCT INR: INR: 1.7 — AB (ref 2.0–3.0)

## 2023-01-08 NOTE — Patient Instructions (Signed)
Take 1 tablet tonight and tomorrow night then resume 1/2 tablet daily  Keep Vit K foods consistent Recheck in 2 weeks

## 2023-01-09 DIAGNOSIS — Z5112 Encounter for antineoplastic immunotherapy: Secondary | ICD-10-CM | POA: Diagnosis not present

## 2023-01-09 DIAGNOSIS — I82210 Acute embolism and thrombosis of superior vena cava: Secondary | ICD-10-CM | POA: Diagnosis not present

## 2023-01-13 ENCOUNTER — Ambulatory Visit: Payer: Medicare Other | Attending: Internal Medicine

## 2023-01-13 DIAGNOSIS — Z952 Presence of prosthetic heart valve: Secondary | ICD-10-CM | POA: Diagnosis not present

## 2023-01-13 LAB — ECHOCARDIOGRAM COMPLETE
AR max vel: 1.03 cm2
AV Area VTI: 1.19 cm2
AV Area mean vel: 1.13 cm2
AV Mean grad: 9 mmHg
AV Peak grad: 16.6 mmHg
Ao pk vel: 2.04 m/s
Area-P 1/2: 3.42 cm2
Calc EF: 65.2 %
S' Lateral: 2.4 cm
Single Plane A2C EF: 59.4 %
Single Plane A4C EF: 72.5 %

## 2023-01-20 DIAGNOSIS — I82210 Acute embolism and thrombosis of superior vena cava: Secondary | ICD-10-CM | POA: Diagnosis not present

## 2023-01-20 DIAGNOSIS — C3401 Malignant neoplasm of right main bronchus: Secondary | ICD-10-CM | POA: Diagnosis not present

## 2023-01-20 DIAGNOSIS — Z5112 Encounter for antineoplastic immunotherapy: Secondary | ICD-10-CM | POA: Diagnosis not present

## 2023-01-20 DIAGNOSIS — E039 Hypothyroidism, unspecified: Secondary | ICD-10-CM | POA: Diagnosis not present

## 2023-01-20 DIAGNOSIS — C7880 Secondary malignant neoplasm of unspecified digestive organ: Secondary | ICD-10-CM | POA: Diagnosis not present

## 2023-01-20 DIAGNOSIS — C3491 Malignant neoplasm of unspecified part of right bronchus or lung: Secondary | ICD-10-CM | POA: Diagnosis not present

## 2023-01-21 DIAGNOSIS — Z5112 Encounter for antineoplastic immunotherapy: Secondary | ICD-10-CM | POA: Diagnosis not present

## 2023-01-21 DIAGNOSIS — C3401 Malignant neoplasm of right main bronchus: Secondary | ICD-10-CM | POA: Diagnosis not present

## 2023-01-21 DIAGNOSIS — J9859 Other diseases of mediastinum, not elsewhere classified: Secondary | ICD-10-CM | POA: Diagnosis not present

## 2023-01-21 DIAGNOSIS — R634 Abnormal weight loss: Secondary | ICD-10-CM | POA: Diagnosis not present

## 2023-01-22 ENCOUNTER — Ambulatory Visit: Payer: Medicare Other | Attending: Cardiovascular Disease | Admitting: *Deleted

## 2023-01-22 DIAGNOSIS — Z5181 Encounter for therapeutic drug level monitoring: Secondary | ICD-10-CM | POA: Insufficient documentation

## 2023-01-22 DIAGNOSIS — Z952 Presence of prosthetic heart valve: Secondary | ICD-10-CM | POA: Diagnosis not present

## 2023-01-22 LAB — POCT INR: INR: 1.3 — AB (ref 2.0–3.0)

## 2023-01-22 NOTE — Patient Instructions (Signed)
Take 1 tablet tonight then increase dose to 1/2 tablet daily except 1 tablet on Tuesdays and Fridays Keep Vit K foods consistent Recheck in 1 week

## 2023-01-28 ENCOUNTER — Ambulatory Visit: Payer: Medicare Other | Attending: Cardiovascular Disease | Admitting: *Deleted

## 2023-01-28 DIAGNOSIS — Z952 Presence of prosthetic heart valve: Secondary | ICD-10-CM

## 2023-01-28 DIAGNOSIS — Z5181 Encounter for therapeutic drug level monitoring: Secondary | ICD-10-CM | POA: Diagnosis not present

## 2023-01-28 LAB — POCT INR: INR: 1.4 — AB (ref 2.0–3.0)

## 2023-01-28 NOTE — Patient Instructions (Signed)
Increase warfarin to 1 tablet daily except 1/2 tablet on Sundays, Tuesdays and Saturdays Keep Vit K foods consistent Recheck in 1 week

## 2023-02-04 ENCOUNTER — Ambulatory Visit: Payer: Medicare Other | Attending: Cardiovascular Disease | Admitting: *Deleted

## 2023-02-04 DIAGNOSIS — R030 Elevated blood-pressure reading, without diagnosis of hypertension: Secondary | ICD-10-CM | POA: Diagnosis not present

## 2023-02-04 DIAGNOSIS — Z5181 Encounter for therapeutic drug level monitoring: Secondary | ICD-10-CM | POA: Diagnosis not present

## 2023-02-04 DIAGNOSIS — Z952 Presence of prosthetic heart valve: Secondary | ICD-10-CM | POA: Insufficient documentation

## 2023-02-04 DIAGNOSIS — E538 Deficiency of other specified B group vitamins: Secondary | ICD-10-CM | POA: Diagnosis not present

## 2023-02-04 LAB — POCT INR: INR: 2.9 (ref 2.0–3.0)

## 2023-02-04 MED ORDER — WARFARIN SODIUM 2 MG PO TABS: ORAL_TABLET | ORAL | 5 refills | Status: DC

## 2023-02-04 NOTE — Patient Instructions (Addendum)
Continue warfarin 1 tablet daily except 1/2 tablet on Sundays, Tuesdays and Thursdays Keep Vit K foods consistent Recheck in 2 week

## 2023-02-09 DIAGNOSIS — C3401 Malignant neoplasm of right main bronchus: Secondary | ICD-10-CM | POA: Diagnosis not present

## 2023-02-09 DIAGNOSIS — Z5112 Encounter for antineoplastic immunotherapy: Secondary | ICD-10-CM | POA: Diagnosis not present

## 2023-02-09 DIAGNOSIS — C3491 Malignant neoplasm of unspecified part of right bronchus or lung: Secondary | ICD-10-CM | POA: Diagnosis not present

## 2023-02-09 DIAGNOSIS — Z95828 Presence of other vascular implants and grafts: Secondary | ICD-10-CM | POA: Diagnosis not present

## 2023-02-09 DIAGNOSIS — Z952 Presence of prosthetic heart valve: Secondary | ICD-10-CM | POA: Diagnosis not present

## 2023-02-09 DIAGNOSIS — D471 Chronic myeloproliferative disease: Secondary | ICD-10-CM | POA: Diagnosis not present

## 2023-02-09 DIAGNOSIS — Z7901 Long term (current) use of anticoagulants: Secondary | ICD-10-CM | POA: Diagnosis not present

## 2023-02-13 ENCOUNTER — Telehealth: Payer: Self-pay | Admitting: Internal Medicine

## 2023-02-13 NOTE — Telephone Encounter (Signed)
   Pre-operative Risk Assessment    Patient Name: Judy Sawyer  DOB: 04/05/1956 MRN: [id number]      Request for Surgical Clearance    Procedure:   port removed   Date of Surgery:  Clearance 02/20/23                                 Surgeon:  Dr. Fraser Din  Surgeon's Group or Practice Name:  [facility]  Phone number:  [phone] Fax number:  [phone]    Type of Clearance Requested:   - Pharmacy:  Hold patient will need a lovenox bridge   ?   Type of Anesthesia:  Local    Additional requests/questions:    Lajuana Matte   02/13/2023, 4:44 PM

## 2023-02-16 NOTE — Telephone Encounter (Signed)
Please advise holding coumadin prior to port removal.   Thank you!  DW

## 2023-02-16 NOTE — Telephone Encounter (Signed)
Patient with diagnosis of mechanical aortic valve replacement on warfarin for anticoagulation.    Procedure: port removal Date of procedure: 02/20/23  Per office protocol, patient can hold warfarin for 3-5 days prior to procedure.   Patient will NOT need bridging with Lovenox (enoxaparin) around procedure.  **This guidance is not considered finalized until pre-operative APP has relayed final recommendations.**

## 2023-02-16 NOTE — Telephone Encounter (Signed)
   Name: Judy Sawyer  DOB: 17-Dec-1955  MRN: [id number]   Primary Cardiologist: Marjo Bicker, MD  Chart reviewed as part of pre-operative protocol coverage.   Per Pharm D, patient may hold warfarin for 3-5 days prior to procedure.   Patient will NOT need bridging with Lovenox (enoxaparin) around procedure.  I will route this recommendation to the requesting party via Epic fax function and remove from pre-op pool. Please call with questions.  Carlos Levering, NP 02/16/2023, 12:55 PM

## 2023-02-18 ENCOUNTER — Ambulatory Visit: Payer: Medicare Other | Attending: Cardiovascular Disease | Admitting: *Deleted

## 2023-02-18 DIAGNOSIS — Z5181 Encounter for therapeutic drug level monitoring: Secondary | ICD-10-CM | POA: Diagnosis not present

## 2023-02-18 DIAGNOSIS — Z5112 Encounter for antineoplastic immunotherapy: Secondary | ICD-10-CM | POA: Diagnosis not present

## 2023-02-18 DIAGNOSIS — R634 Abnormal weight loss: Secondary | ICD-10-CM | POA: Diagnosis not present

## 2023-02-18 DIAGNOSIS — J9859 Other diseases of mediastinum, not elsewhere classified: Secondary | ICD-10-CM | POA: Diagnosis not present

## 2023-02-18 DIAGNOSIS — Z952 Presence of prosthetic heart valve: Secondary | ICD-10-CM | POA: Diagnosis not present

## 2023-02-18 DIAGNOSIS — Z1589 Genetic susceptibility to other disease: Secondary | ICD-10-CM | POA: Diagnosis not present

## 2023-02-18 DIAGNOSIS — D471 Chronic myeloproliferative disease: Secondary | ICD-10-CM | POA: Diagnosis not present

## 2023-02-18 DIAGNOSIS — Z09 Encounter for follow-up examination after completed treatment for conditions other than malignant neoplasm: Secondary | ICD-10-CM | POA: Diagnosis not present

## 2023-02-18 DIAGNOSIS — E039 Hypothyroidism, unspecified: Secondary | ICD-10-CM | POA: Diagnosis not present

## 2023-02-18 DIAGNOSIS — C3401 Malignant neoplasm of right main bronchus: Secondary | ICD-10-CM | POA: Diagnosis not present

## 2023-02-18 DIAGNOSIS — Z7901 Long term (current) use of anticoagulants: Secondary | ICD-10-CM | POA: Diagnosis not present

## 2023-02-18 DIAGNOSIS — I82210 Acute embolism and thrombosis of superior vena cava: Secondary | ICD-10-CM | POA: Diagnosis not present

## 2023-02-18 DIAGNOSIS — C3491 Malignant neoplasm of unspecified part of right bronchus or lung: Secondary | ICD-10-CM | POA: Diagnosis not present

## 2023-02-18 DIAGNOSIS — D751 Secondary polycythemia: Secondary | ICD-10-CM | POA: Diagnosis not present

## 2023-02-18 DIAGNOSIS — C7880 Secondary malignant neoplasm of unspecified digestive organ: Secondary | ICD-10-CM | POA: Diagnosis not present

## 2023-02-18 LAB — POCT INR: INR: 3.5 — AB (ref 2.0–3.0)

## 2023-02-18 NOTE — Patient Instructions (Signed)
Scheduled for port removal on 8/8 at [facility]. Coumadin has been on hold 5 days 8/5 - 8/8 Continue to hold warfarin until after procedure.  Restart night of procedure if OK with MD taking extra 1/2 tablet Friday night then resume 1 tablet daily except 1/2 tablet on Sundays, Tuesdays and Thursdays Keep Vit K foods consistent Recheck in 1 week

## 2023-02-19 DIAGNOSIS — R634 Abnormal weight loss: Secondary | ICD-10-CM | POA: Diagnosis not present

## 2023-02-19 DIAGNOSIS — Z5112 Encounter for antineoplastic immunotherapy: Secondary | ICD-10-CM | POA: Diagnosis not present

## 2023-02-19 DIAGNOSIS — C3401 Malignant neoplasm of right main bronchus: Secondary | ICD-10-CM | POA: Diagnosis not present

## 2023-02-19 DIAGNOSIS — J9859 Other diseases of mediastinum, not elsewhere classified: Secondary | ICD-10-CM | POA: Diagnosis not present

## 2023-02-20 DIAGNOSIS — T82898A Other specified complication of vascular prosthetic devices, implants and grafts, initial encounter: Secondary | ICD-10-CM | POA: Diagnosis not present

## 2023-02-20 DIAGNOSIS — Z452 Encounter for adjustment and management of vascular access device: Secondary | ICD-10-CM | POA: Diagnosis not present

## 2023-02-27 DIAGNOSIS — C3401 Malignant neoplasm of right main bronchus: Secondary | ICD-10-CM | POA: Diagnosis not present

## 2023-02-27 DIAGNOSIS — J8410 Pulmonary fibrosis, unspecified: Secondary | ICD-10-CM | POA: Diagnosis not present

## 2023-02-27 DIAGNOSIS — C3490 Malignant neoplasm of unspecified part of unspecified bronchus or lung: Secondary | ICD-10-CM | POA: Diagnosis not present

## 2023-02-27 DIAGNOSIS — J90 Pleural effusion, not elsewhere classified: Secondary | ICD-10-CM | POA: Diagnosis not present

## 2023-03-02 ENCOUNTER — Telehealth: Payer: Self-pay | Admitting: Internal Medicine

## 2023-03-02 NOTE — Telephone Encounter (Signed)
Pt trying to speak with Judy Sawyer. Please advise

## 2023-03-02 NOTE — Telephone Encounter (Signed)
Returned call to the patient and the phone rang then it was answered but no one said anything and then the phone was hung up, tried a second time and the same happened. Will await another message from the pt since unable to speak to pt or have Misty Stanley follow up when she returns since the phone keeps hanging up on the patient end.

## 2023-03-03 NOTE — Telephone Encounter (Signed)
Spoke with patient.  She needed to reschedule appt she missed last week.  Appt made for 03/05/23.

## 2023-03-05 ENCOUNTER — Ambulatory Visit: Payer: Medicare Other | Attending: Cardiovascular Disease | Admitting: *Deleted

## 2023-03-05 DIAGNOSIS — Z5181 Encounter for therapeutic drug level monitoring: Secondary | ICD-10-CM | POA: Diagnosis not present

## 2023-03-05 DIAGNOSIS — Z952 Presence of prosthetic heart valve: Secondary | ICD-10-CM

## 2023-03-05 LAB — POCT INR: INR: 2.1 (ref 2.0–3.0)

## 2023-03-05 NOTE — Patient Instructions (Signed)
Continue warfarin 1 tablet daily except 1/2 tablet on Sundays, Tuesdays and Thursdays Keep Vit K foods consistent Recheck in 3 weeks

## 2023-03-11 DIAGNOSIS — E538 Deficiency of other specified B group vitamins: Secondary | ICD-10-CM | POA: Diagnosis not present

## 2023-03-15 DIAGNOSIS — Z23 Encounter for immunization: Secondary | ICD-10-CM | POA: Diagnosis not present

## 2023-03-18 DIAGNOSIS — Z1589 Genetic susceptibility to other disease: Secondary | ICD-10-CM | POA: Diagnosis not present

## 2023-03-18 DIAGNOSIS — C3491 Malignant neoplasm of unspecified part of right bronchus or lung: Secondary | ICD-10-CM | POA: Diagnosis not present

## 2023-03-18 DIAGNOSIS — J9859 Other diseases of mediastinum, not elsewhere classified: Secondary | ICD-10-CM | POA: Diagnosis not present

## 2023-03-18 DIAGNOSIS — Z5112 Encounter for antineoplastic immunotherapy: Secondary | ICD-10-CM | POA: Diagnosis not present

## 2023-03-18 DIAGNOSIS — C7880 Secondary malignant neoplasm of unspecified digestive organ: Secondary | ICD-10-CM | POA: Diagnosis not present

## 2023-03-18 DIAGNOSIS — E039 Hypothyroidism, unspecified: Secondary | ICD-10-CM | POA: Diagnosis not present

## 2023-03-18 DIAGNOSIS — D471 Chronic myeloproliferative disease: Secondary | ICD-10-CM | POA: Diagnosis not present

## 2023-03-18 DIAGNOSIS — R634 Abnormal weight loss: Secondary | ICD-10-CM | POA: Diagnosis not present

## 2023-03-18 DIAGNOSIS — C3401 Malignant neoplasm of right main bronchus: Secondary | ICD-10-CM | POA: Diagnosis not present

## 2023-03-19 DIAGNOSIS — J9859 Other diseases of mediastinum, not elsewhere classified: Secondary | ICD-10-CM | POA: Diagnosis not present

## 2023-03-19 DIAGNOSIS — C3401 Malignant neoplasm of right main bronchus: Secondary | ICD-10-CM | POA: Diagnosis not present

## 2023-03-19 DIAGNOSIS — R634 Abnormal weight loss: Secondary | ICD-10-CM | POA: Diagnosis not present

## 2023-03-19 DIAGNOSIS — Z5112 Encounter for antineoplastic immunotherapy: Secondary | ICD-10-CM | POA: Diagnosis not present

## 2023-03-26 DIAGNOSIS — Z923 Personal history of irradiation: Secondary | ICD-10-CM | POA: Diagnosis not present

## 2023-03-26 DIAGNOSIS — K219 Gastro-esophageal reflux disease without esophagitis: Secondary | ICD-10-CM | POA: Diagnosis not present

## 2023-03-26 DIAGNOSIS — Z79890 Hormone replacement therapy (postmenopausal): Secondary | ICD-10-CM | POA: Diagnosis not present

## 2023-03-26 DIAGNOSIS — E039 Hypothyroidism, unspecified: Secondary | ICD-10-CM | POA: Diagnosis not present

## 2023-03-26 DIAGNOSIS — D759 Disease of blood and blood-forming organs, unspecified: Secondary | ICD-10-CM | POA: Diagnosis not present

## 2023-03-26 DIAGNOSIS — J439 Emphysema, unspecified: Secondary | ICD-10-CM | POA: Diagnosis not present

## 2023-03-26 DIAGNOSIS — Z9221 Personal history of antineoplastic chemotherapy: Secondary | ICD-10-CM | POA: Diagnosis not present

## 2023-03-26 DIAGNOSIS — I2510 Atherosclerotic heart disease of native coronary artery without angina pectoris: Secondary | ICD-10-CM | POA: Diagnosis not present

## 2023-03-26 DIAGNOSIS — D471 Chronic myeloproliferative disease: Secondary | ICD-10-CM | POA: Diagnosis not present

## 2023-03-26 DIAGNOSIS — Z90710 Acquired absence of both cervix and uterus: Secondary | ICD-10-CM | POA: Diagnosis not present

## 2023-03-26 DIAGNOSIS — K449 Diaphragmatic hernia without obstruction or gangrene: Secondary | ICD-10-CM | POA: Diagnosis not present

## 2023-03-26 DIAGNOSIS — Z87891 Personal history of nicotine dependence: Secondary | ICD-10-CM | POA: Diagnosis not present

## 2023-03-26 DIAGNOSIS — I6529 Occlusion and stenosis of unspecified carotid artery: Secondary | ICD-10-CM | POA: Diagnosis not present

## 2023-03-26 DIAGNOSIS — M810 Age-related osteoporosis without current pathological fracture: Secondary | ICD-10-CM | POA: Diagnosis not present

## 2023-03-26 DIAGNOSIS — Z952 Presence of prosthetic heart valve: Secondary | ICD-10-CM | POA: Diagnosis not present

## 2023-03-26 DIAGNOSIS — Z7951 Long term (current) use of inhaled steroids: Secondary | ICD-10-CM | POA: Diagnosis not present

## 2023-03-26 DIAGNOSIS — Z7901 Long term (current) use of anticoagulants: Secondary | ICD-10-CM | POA: Diagnosis not present

## 2023-03-26 DIAGNOSIS — Z85118 Personal history of other malignant neoplasm of bronchus and lung: Secondary | ICD-10-CM | POA: Diagnosis not present

## 2023-03-26 DIAGNOSIS — I252 Old myocardial infarction: Secondary | ICD-10-CM | POA: Diagnosis not present

## 2023-03-26 DIAGNOSIS — I10 Essential (primary) hypertension: Secondary | ICD-10-CM | POA: Diagnosis not present

## 2023-03-26 DIAGNOSIS — K222 Esophageal obstruction: Secondary | ICD-10-CM | POA: Diagnosis not present

## 2023-03-26 DIAGNOSIS — Z7982 Long term (current) use of aspirin: Secondary | ICD-10-CM | POA: Diagnosis not present

## 2023-03-31 ENCOUNTER — Ambulatory Visit: Payer: Medicare Other | Attending: Cardiovascular Disease

## 2023-03-31 DIAGNOSIS — Z5181 Encounter for therapeutic drug level monitoring: Secondary | ICD-10-CM | POA: Diagnosis not present

## 2023-03-31 DIAGNOSIS — Z23 Encounter for immunization: Secondary | ICD-10-CM | POA: Diagnosis not present

## 2023-03-31 DIAGNOSIS — Z952 Presence of prosthetic heart valve: Secondary | ICD-10-CM | POA: Diagnosis not present

## 2023-03-31 LAB — POCT INR: INR: 3 (ref 2.0–3.0)

## 2023-03-31 NOTE — Patient Instructions (Signed)
Description   Continue warfarin 1 tablet daily except 1/2 tablet on Sundays, Tuesdays and Thursdays Keep Vit K foods consistent Recheck in 4 weeks

## 2023-04-15 DIAGNOSIS — C3491 Malignant neoplasm of unspecified part of right bronchus or lung: Secondary | ICD-10-CM | POA: Diagnosis not present

## 2023-04-15 DIAGNOSIS — C3401 Malignant neoplasm of right main bronchus: Secondary | ICD-10-CM | POA: Diagnosis not present

## 2023-04-15 DIAGNOSIS — Z1589 Genetic susceptibility to other disease: Secondary | ICD-10-CM | POA: Diagnosis not present

## 2023-04-15 DIAGNOSIS — R030 Elevated blood-pressure reading, without diagnosis of hypertension: Secondary | ICD-10-CM | POA: Diagnosis not present

## 2023-04-15 DIAGNOSIS — E039 Hypothyroidism, unspecified: Secondary | ICD-10-CM | POA: Diagnosis not present

## 2023-04-15 DIAGNOSIS — E538 Deficiency of other specified B group vitamins: Secondary | ICD-10-CM | POA: Diagnosis not present

## 2023-04-15 DIAGNOSIS — C7880 Secondary malignant neoplasm of unspecified digestive organ: Secondary | ICD-10-CM | POA: Diagnosis not present

## 2023-04-15 DIAGNOSIS — D471 Chronic myeloproliferative disease: Secondary | ICD-10-CM | POA: Diagnosis not present

## 2023-04-15 DIAGNOSIS — R634 Abnormal weight loss: Secondary | ICD-10-CM | POA: Diagnosis not present

## 2023-04-15 DIAGNOSIS — Z5112 Encounter for antineoplastic immunotherapy: Secondary | ICD-10-CM | POA: Diagnosis not present

## 2023-04-15 DIAGNOSIS — J9859 Other diseases of mediastinum, not elsewhere classified: Secondary | ICD-10-CM | POA: Diagnosis not present

## 2023-04-16 DIAGNOSIS — Z5111 Encounter for antineoplastic chemotherapy: Secondary | ICD-10-CM | POA: Diagnosis not present

## 2023-04-16 DIAGNOSIS — R634 Abnormal weight loss: Secondary | ICD-10-CM | POA: Diagnosis not present

## 2023-04-16 DIAGNOSIS — C3401 Malignant neoplasm of right main bronchus: Secondary | ICD-10-CM | POA: Diagnosis not present

## 2023-04-16 DIAGNOSIS — J9859 Other diseases of mediastinum, not elsewhere classified: Secondary | ICD-10-CM | POA: Diagnosis not present

## 2023-04-28 ENCOUNTER — Ambulatory Visit: Payer: Medicare Other | Attending: Cardiovascular Disease | Admitting: *Deleted

## 2023-04-28 DIAGNOSIS — Z5181 Encounter for therapeutic drug level monitoring: Secondary | ICD-10-CM | POA: Diagnosis not present

## 2023-04-28 DIAGNOSIS — Z952 Presence of prosthetic heart valve: Secondary | ICD-10-CM | POA: Insufficient documentation

## 2023-04-28 LAB — POCT INR: INR: 1.3 — AB (ref 2.0–3.0)

## 2023-04-28 NOTE — Patient Instructions (Signed)
Take warfarin 1 1/2 tablets tonight and tomorrow night then resume 1 tablet daily except 1/2 tablet on Sundays, Tuesdays and Thursdays Keep Vit K foods consistent Recheck in 1 week

## 2023-05-05 ENCOUNTER — Ambulatory Visit: Payer: Medicare Other | Attending: Cardiovascular Disease | Admitting: *Deleted

## 2023-05-05 DIAGNOSIS — Z952 Presence of prosthetic heart valve: Secondary | ICD-10-CM | POA: Diagnosis not present

## 2023-05-05 DIAGNOSIS — Z5181 Encounter for therapeutic drug level monitoring: Secondary | ICD-10-CM

## 2023-05-05 LAB — POCT INR: INR: 2.2 (ref 2.0–3.0)

## 2023-05-05 NOTE — Patient Instructions (Signed)
Continue warfarin 1 tablet daily except 1/2 tablet on Sundays, Tuesdays and Thursdays Keep Vit K foods consistent Recheck in 3 weeks

## 2023-05-12 DIAGNOSIS — J9859 Other diseases of mediastinum, not elsewhere classified: Secondary | ICD-10-CM | POA: Diagnosis not present

## 2023-05-12 DIAGNOSIS — Z1589 Genetic susceptibility to other disease: Secondary | ICD-10-CM | POA: Diagnosis not present

## 2023-05-12 DIAGNOSIS — E039 Hypothyroidism, unspecified: Secondary | ICD-10-CM | POA: Diagnosis not present

## 2023-05-12 DIAGNOSIS — E032 Hypothyroidism due to medicaments and other exogenous substances: Secondary | ICD-10-CM | POA: Diagnosis not present

## 2023-05-12 DIAGNOSIS — C3401 Malignant neoplasm of right main bronchus: Secondary | ICD-10-CM | POA: Diagnosis not present

## 2023-05-12 DIAGNOSIS — C7880 Secondary malignant neoplasm of unspecified digestive organ: Secondary | ICD-10-CM | POA: Diagnosis not present

## 2023-05-12 DIAGNOSIS — D471 Chronic myeloproliferative disease: Secondary | ICD-10-CM | POA: Diagnosis not present

## 2023-05-12 DIAGNOSIS — C3491 Malignant neoplasm of unspecified part of right bronchus or lung: Secondary | ICD-10-CM | POA: Diagnosis not present

## 2023-05-12 DIAGNOSIS — R634 Abnormal weight loss: Secondary | ICD-10-CM | POA: Diagnosis not present

## 2023-05-12 DIAGNOSIS — Z5112 Encounter for antineoplastic immunotherapy: Secondary | ICD-10-CM | POA: Diagnosis not present

## 2023-05-14 DIAGNOSIS — R634 Abnormal weight loss: Secondary | ICD-10-CM | POA: Diagnosis not present

## 2023-05-14 DIAGNOSIS — J9859 Other diseases of mediastinum, not elsewhere classified: Secondary | ICD-10-CM | POA: Diagnosis not present

## 2023-05-14 DIAGNOSIS — C3401 Malignant neoplasm of right main bronchus: Secondary | ICD-10-CM | POA: Diagnosis not present

## 2023-05-14 DIAGNOSIS — Z5112 Encounter for antineoplastic immunotherapy: Secondary | ICD-10-CM | POA: Diagnosis not present

## 2023-05-15 DIAGNOSIS — R030 Elevated blood-pressure reading, without diagnosis of hypertension: Secondary | ICD-10-CM | POA: Diagnosis not present

## 2023-05-15 DIAGNOSIS — E538 Deficiency of other specified B group vitamins: Secondary | ICD-10-CM | POA: Diagnosis not present

## 2023-05-26 ENCOUNTER — Ambulatory Visit: Payer: Medicare Other | Attending: Cardiovascular Disease | Admitting: *Deleted

## 2023-05-26 DIAGNOSIS — Z952 Presence of prosthetic heart valve: Secondary | ICD-10-CM | POA: Diagnosis not present

## 2023-05-26 DIAGNOSIS — Z5181 Encounter for therapeutic drug level monitoring: Secondary | ICD-10-CM | POA: Diagnosis not present

## 2023-05-26 LAB — POCT INR: INR: 2 (ref 2.0–3.0)

## 2023-05-26 NOTE — Patient Instructions (Signed)
Continue warfarin 1 tablet daily except 1/2 tablet on Sundays, Tuesdays and Thursdays Keep Vit K foods consistent Recheck in 4 weeks

## 2023-06-04 DIAGNOSIS — Z681 Body mass index (BMI) 19 or less, adult: Secondary | ICD-10-CM | POA: Diagnosis not present

## 2023-06-04 DIAGNOSIS — Z20828 Contact with and (suspected) exposure to other viral communicable diseases: Secondary | ICD-10-CM | POA: Diagnosis not present

## 2023-06-04 DIAGNOSIS — J209 Acute bronchitis, unspecified: Secondary | ICD-10-CM | POA: Diagnosis not present

## 2023-06-04 DIAGNOSIS — R059 Cough, unspecified: Secondary | ICD-10-CM | POA: Diagnosis not present

## 2023-06-04 DIAGNOSIS — E039 Hypothyroidism, unspecified: Secondary | ICD-10-CM | POA: Diagnosis not present

## 2023-06-09 DIAGNOSIS — J9859 Other diseases of mediastinum, not elsewhere classified: Secondary | ICD-10-CM | POA: Diagnosis not present

## 2023-06-09 DIAGNOSIS — C3401 Malignant neoplasm of right main bronchus: Secondary | ICD-10-CM | POA: Diagnosis not present

## 2023-06-09 DIAGNOSIS — R634 Abnormal weight loss: Secondary | ICD-10-CM | POA: Diagnosis not present

## 2023-06-10 DIAGNOSIS — J9859 Other diseases of mediastinum, not elsewhere classified: Secondary | ICD-10-CM | POA: Diagnosis not present

## 2023-06-10 DIAGNOSIS — R634 Abnormal weight loss: Secondary | ICD-10-CM | POA: Diagnosis not present

## 2023-06-10 DIAGNOSIS — C3401 Malignant neoplasm of right main bronchus: Secondary | ICD-10-CM | POA: Diagnosis not present

## 2023-06-15 DIAGNOSIS — E538 Deficiency of other specified B group vitamins: Secondary | ICD-10-CM | POA: Diagnosis not present

## 2023-06-15 DIAGNOSIS — R030 Elevated blood-pressure reading, without diagnosis of hypertension: Secondary | ICD-10-CM | POA: Diagnosis not present

## 2023-06-24 ENCOUNTER — Ambulatory Visit: Payer: Medicare Other | Attending: Cardiovascular Disease | Admitting: *Deleted

## 2023-06-24 DIAGNOSIS — Z952 Presence of prosthetic heart valve: Secondary | ICD-10-CM | POA: Insufficient documentation

## 2023-06-24 DIAGNOSIS — Z5181 Encounter for therapeutic drug level monitoring: Secondary | ICD-10-CM | POA: Insufficient documentation

## 2023-06-24 LAB — POCT INR: INR: 1.9 — AB (ref 2.0–3.0)

## 2023-06-24 NOTE — Patient Instructions (Signed)
Increase warfarin to 1 tablet daily except 1/2 tablet on Tuesdays and Fridays Keep Vit K foods consistent Recheck in 4 weeks

## 2023-06-29 DIAGNOSIS — Z7982 Long term (current) use of aspirin: Secondary | ICD-10-CM | POA: Diagnosis not present

## 2023-06-29 DIAGNOSIS — Z90710 Acquired absence of both cervix and uterus: Secondary | ICD-10-CM | POA: Diagnosis not present

## 2023-06-29 DIAGNOSIS — Z7901 Long term (current) use of anticoagulants: Secondary | ICD-10-CM | POA: Diagnosis not present

## 2023-06-29 DIAGNOSIS — E039 Hypothyroidism, unspecified: Secondary | ICD-10-CM | POA: Diagnosis not present

## 2023-06-29 DIAGNOSIS — Z79890 Hormone replacement therapy (postmenopausal): Secondary | ICD-10-CM | POA: Diagnosis not present

## 2023-06-29 DIAGNOSIS — J439 Emphysema, unspecified: Secondary | ICD-10-CM | POA: Diagnosis not present

## 2023-06-29 DIAGNOSIS — K219 Gastro-esophageal reflux disease without esophagitis: Secondary | ICD-10-CM | POA: Diagnosis not present

## 2023-06-29 DIAGNOSIS — I10 Essential (primary) hypertension: Secondary | ICD-10-CM | POA: Diagnosis not present

## 2023-06-29 DIAGNOSIS — K222 Esophageal obstruction: Secondary | ICD-10-CM | POA: Diagnosis not present

## 2023-06-29 DIAGNOSIS — Z79899 Other long term (current) drug therapy: Secondary | ICD-10-CM | POA: Diagnosis not present

## 2023-06-29 DIAGNOSIS — I2510 Atherosclerotic heart disease of native coronary artery without angina pectoris: Secondary | ICD-10-CM | POA: Diagnosis not present

## 2023-06-29 DIAGNOSIS — K449 Diaphragmatic hernia without obstruction or gangrene: Secondary | ICD-10-CM | POA: Diagnosis not present

## 2023-07-02 DIAGNOSIS — Z681 Body mass index (BMI) 19 or less, adult: Secondary | ICD-10-CM | POA: Diagnosis not present

## 2023-07-02 DIAGNOSIS — C785 Secondary malignant neoplasm of large intestine and rectum: Secondary | ICD-10-CM | POA: Diagnosis not present

## 2023-07-02 DIAGNOSIS — E039 Hypothyroidism, unspecified: Secondary | ICD-10-CM | POA: Diagnosis not present

## 2023-07-02 DIAGNOSIS — I2510 Atherosclerotic heart disease of native coronary artery without angina pectoris: Secondary | ICD-10-CM | POA: Diagnosis not present

## 2023-07-02 DIAGNOSIS — E538 Deficiency of other specified B group vitamins: Secondary | ICD-10-CM | POA: Diagnosis not present

## 2023-07-02 DIAGNOSIS — R030 Elevated blood-pressure reading, without diagnosis of hypertension: Secondary | ICD-10-CM | POA: Diagnosis not present

## 2023-07-02 DIAGNOSIS — K219 Gastro-esophageal reflux disease without esophagitis: Secondary | ICD-10-CM | POA: Diagnosis not present

## 2023-07-09 DIAGNOSIS — J9859 Other diseases of mediastinum, not elsewhere classified: Secondary | ICD-10-CM | POA: Diagnosis not present

## 2023-07-09 DIAGNOSIS — C3491 Malignant neoplasm of unspecified part of right bronchus or lung: Secondary | ICD-10-CM | POA: Diagnosis not present

## 2023-07-09 DIAGNOSIS — Z1589 Genetic susceptibility to other disease: Secondary | ICD-10-CM | POA: Diagnosis not present

## 2023-07-09 DIAGNOSIS — C7880 Secondary malignant neoplasm of unspecified digestive organ: Secondary | ICD-10-CM | POA: Diagnosis not present

## 2023-07-09 DIAGNOSIS — Z5112 Encounter for antineoplastic immunotherapy: Secondary | ICD-10-CM | POA: Diagnosis not present

## 2023-07-09 DIAGNOSIS — C3401 Malignant neoplasm of right main bronchus: Secondary | ICD-10-CM | POA: Diagnosis not present

## 2023-07-09 DIAGNOSIS — D471 Chronic myeloproliferative disease: Secondary | ICD-10-CM | POA: Diagnosis not present

## 2023-07-09 DIAGNOSIS — R634 Abnormal weight loss: Secondary | ICD-10-CM | POA: Diagnosis not present

## 2023-07-10 DIAGNOSIS — Z5112 Encounter for antineoplastic immunotherapy: Secondary | ICD-10-CM | POA: Diagnosis not present

## 2023-07-10 DIAGNOSIS — J439 Emphysema, unspecified: Secondary | ICD-10-CM | POA: Diagnosis not present

## 2023-07-10 DIAGNOSIS — Z923 Personal history of irradiation: Secondary | ICD-10-CM | POA: Diagnosis not present

## 2023-07-10 DIAGNOSIS — I700 Atherosclerosis of aorta: Secondary | ICD-10-CM | POA: Diagnosis not present

## 2023-07-10 DIAGNOSIS — R634 Abnormal weight loss: Secondary | ICD-10-CM | POA: Diagnosis not present

## 2023-07-10 DIAGNOSIS — C7880 Secondary malignant neoplasm of unspecified digestive organ: Secondary | ICD-10-CM | POA: Diagnosis not present

## 2023-07-10 DIAGNOSIS — I2510 Atherosclerotic heart disease of native coronary artery without angina pectoris: Secondary | ICD-10-CM | POA: Diagnosis not present

## 2023-07-10 DIAGNOSIS — R918 Other nonspecific abnormal finding of lung field: Secondary | ICD-10-CM | POA: Diagnosis not present

## 2023-07-10 DIAGNOSIS — I7121 Aneurysm of the ascending aorta, without rupture: Secondary | ICD-10-CM | POA: Diagnosis not present

## 2023-07-10 DIAGNOSIS — Z7901 Long term (current) use of anticoagulants: Secondary | ICD-10-CM | POA: Diagnosis not present

## 2023-07-10 DIAGNOSIS — C7889 Secondary malignant neoplasm of other digestive organs: Secondary | ICD-10-CM | POA: Diagnosis not present

## 2023-07-10 DIAGNOSIS — J8410 Pulmonary fibrosis, unspecified: Secondary | ICD-10-CM | POA: Diagnosis not present

## 2023-07-10 DIAGNOSIS — C3491 Malignant neoplasm of unspecified part of right bronchus or lung: Secondary | ICD-10-CM | POA: Diagnosis not present

## 2023-07-10 DIAGNOSIS — Z9221 Personal history of antineoplastic chemotherapy: Secondary | ICD-10-CM | POA: Diagnosis not present

## 2023-07-10 DIAGNOSIS — C3401 Malignant neoplasm of right main bronchus: Secondary | ICD-10-CM | POA: Diagnosis not present

## 2023-07-10 DIAGNOSIS — Z79890 Hormone replacement therapy (postmenopausal): Secondary | ICD-10-CM | POA: Diagnosis not present

## 2023-07-10 DIAGNOSIS — R519 Headache, unspecified: Secondary | ICD-10-CM | POA: Diagnosis not present

## 2023-07-10 DIAGNOSIS — J9859 Other diseases of mediastinum, not elsewhere classified: Secondary | ICD-10-CM | POA: Diagnosis not present

## 2023-07-10 DIAGNOSIS — D471 Chronic myeloproliferative disease: Secondary | ICD-10-CM | POA: Diagnosis not present

## 2023-07-10 DIAGNOSIS — K769 Liver disease, unspecified: Secondary | ICD-10-CM | POA: Diagnosis not present

## 2023-07-10 DIAGNOSIS — R591 Generalized enlarged lymph nodes: Secondary | ICD-10-CM | POA: Diagnosis not present

## 2023-07-10 DIAGNOSIS — Z7982 Long term (current) use of aspirin: Secondary | ICD-10-CM | POA: Diagnosis not present

## 2023-07-16 DIAGNOSIS — R030 Elevated blood-pressure reading, without diagnosis of hypertension: Secondary | ICD-10-CM | POA: Diagnosis not present

## 2023-07-16 DIAGNOSIS — E538 Deficiency of other specified B group vitamins: Secondary | ICD-10-CM | POA: Diagnosis not present

## 2023-07-22 ENCOUNTER — Ambulatory Visit: Payer: Medicare Other | Attending: Cardiovascular Disease | Admitting: *Deleted

## 2023-07-22 DIAGNOSIS — Z952 Presence of prosthetic heart valve: Secondary | ICD-10-CM | POA: Diagnosis not present

## 2023-07-22 DIAGNOSIS — Z5181 Encounter for therapeutic drug level monitoring: Secondary | ICD-10-CM | POA: Diagnosis not present

## 2023-07-22 LAB — POCT INR: INR: 1.5 — AB (ref 2.0–3.0)

## 2023-07-22 NOTE — Patient Instructions (Signed)
 Take warfarin 2 tablets tonight then increase dose to 1 tablet daily except 1/2 tablet on Tuesdays Keep Vit K foods consistent Recheck in 2 weeks

## 2023-08-05 DIAGNOSIS — R634 Abnormal weight loss: Secondary | ICD-10-CM | POA: Diagnosis not present

## 2023-08-05 DIAGNOSIS — C3401 Malignant neoplasm of right main bronchus: Secondary | ICD-10-CM | POA: Diagnosis not present

## 2023-08-05 DIAGNOSIS — D471 Chronic myeloproliferative disease: Secondary | ICD-10-CM | POA: Diagnosis not present

## 2023-08-05 DIAGNOSIS — Z1589 Genetic susceptibility to other disease: Secondary | ICD-10-CM | POA: Diagnosis not present

## 2023-08-05 DIAGNOSIS — J9859 Other diseases of mediastinum, not elsewhere classified: Secondary | ICD-10-CM | POA: Diagnosis not present

## 2023-08-06 ENCOUNTER — Ambulatory Visit: Payer: Medicare Other | Attending: Cardiovascular Disease

## 2023-08-10 DIAGNOSIS — Z681 Body mass index (BMI) 19 or less, adult: Secondary | ICD-10-CM | POA: Diagnosis not present

## 2023-08-10 DIAGNOSIS — C3491 Malignant neoplasm of unspecified part of right bronchus or lung: Secondary | ICD-10-CM | POA: Diagnosis not present

## 2023-08-10 DIAGNOSIS — H8113 Benign paroxysmal vertigo, bilateral: Secondary | ICD-10-CM | POA: Diagnosis not present

## 2023-08-10 DIAGNOSIS — I4891 Unspecified atrial fibrillation: Secondary | ICD-10-CM | POA: Diagnosis not present

## 2023-08-10 DIAGNOSIS — J449 Chronic obstructive pulmonary disease, unspecified: Secondary | ICD-10-CM | POA: Diagnosis not present

## 2023-08-11 DIAGNOSIS — Z5112 Encounter for antineoplastic immunotherapy: Secondary | ICD-10-CM | POA: Diagnosis not present

## 2023-08-11 DIAGNOSIS — J9859 Other diseases of mediastinum, not elsewhere classified: Secondary | ICD-10-CM | POA: Diagnosis not present

## 2023-08-11 DIAGNOSIS — C3401 Malignant neoplasm of right main bronchus: Secondary | ICD-10-CM | POA: Diagnosis not present

## 2023-08-11 DIAGNOSIS — R634 Abnormal weight loss: Secondary | ICD-10-CM | POA: Diagnosis not present

## 2023-08-14 DIAGNOSIS — R110 Nausea: Secondary | ICD-10-CM | POA: Diagnosis not present

## 2023-08-14 DIAGNOSIS — H8113 Benign paroxysmal vertigo, bilateral: Secondary | ICD-10-CM | POA: Diagnosis not present

## 2023-08-14 DIAGNOSIS — R42 Dizziness and giddiness: Secondary | ICD-10-CM | POA: Diagnosis not present

## 2023-08-14 DIAGNOSIS — G319 Degenerative disease of nervous system, unspecified: Secondary | ICD-10-CM | POA: Diagnosis not present

## 2023-08-17 DIAGNOSIS — E538 Deficiency of other specified B group vitamins: Secondary | ICD-10-CM | POA: Diagnosis not present

## 2023-08-31 ENCOUNTER — Ambulatory Visit (INDEPENDENT_AMBULATORY_CARE_PROVIDER_SITE_OTHER): Payer: Medicare Other | Admitting: Neurology

## 2023-08-31 ENCOUNTER — Encounter: Payer: Self-pay | Admitting: Neurology

## 2023-08-31 VITALS — BP 134/70 | HR 92

## 2023-08-31 DIAGNOSIS — G969 Disorder of central nervous system, unspecified: Secondary | ICD-10-CM

## 2023-08-31 DIAGNOSIS — I639 Cerebral infarction, unspecified: Secondary | ICD-10-CM | POA: Diagnosis not present

## 2023-08-31 DIAGNOSIS — E7489 Other specified disorders of carbohydrate metabolism: Secondary | ICD-10-CM

## 2023-08-31 DIAGNOSIS — R42 Dizziness and giddiness: Secondary | ICD-10-CM | POA: Diagnosis not present

## 2023-08-31 DIAGNOSIS — R251 Tremor, unspecified: Secondary | ICD-10-CM

## 2023-08-31 NOTE — Patient Instructions (Signed)
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 I also advised the patient to eat a healthy diet with plenty of whole grains, cereals, fruits and vegetables, exercise regularly and maintain ideal body weight.  Check MR angiogram of the brain and neck, lipid profile, hemoglobin A1c, competence of metabolic panel, CBC and thyroid function tests.  The tremors do not seem disabling at the present time and hence we will hold off on medications.  I will refer the patient to physical therapy for vestibular rehab exercises for benign positional vertigo.  Followup in the future with me in 6 months or call earlier if necessary.  Stroke Prevention Some medical conditions and behaviors can lead to a higher chance of having a stroke. You can help prevent a stroke by eating healthy, exercising, not smoking, and managing any medical conditions you have. Stroke is a leading cause of functional impairment. Primary prevention is particularly important because a majority of strokes are first-time events. Stroke changes the lives of not only those who experience a stroke but also their family and other caregivers. How can this condition affect me? A stroke is a medical emergency and should be treated right away. A stroke can lead to brain damage and can sometimes be life-threatening. If a person gets medical treatment right away,  there is a better chance of surviving and recovering from a stroke. What can increase my risk? The following medical conditions may increase your risk of a stroke: Cardiovascular disease. High blood pressure (hypertension). Diabetes. High cholesterol. Sickle cell disease. Blood clotting disorders (hypercoagulable state). Obesity. Sleep disorders (obstructive sleep apnea). Other risk factors include: Being older than age 55. Having a history of blood clots, stroke, or mini-stroke (transient ischemic attack, TIA). Genetic factors, such as race, ethnicity, or a family history of stroke. Smoking cigarettes or using other tobacco products. Taking birth control pills, especially if you also use tobacco. Heavy use of alcohol or drugs, especially cocaine and methamphetamine. Physical inactivity. What actions can I take to prevent this? Manage your health conditions High cholesterol levels. Eating a healthy diet is important for preventing high cholesterol. If cholesterol cannot be managed through diet alone, you may need to take medicines. Take any prescribed medicines to control your cholesterol as told by your health care provider. Hypertension. To reduce your risk of stroke, try to keep your blood pressure below 130/80. Eating a healthy diet and exercising regularly are important for controlling blood pressure. If these steps are not enough to manage your blood pressure, you may need to take medicines. Take any prescribed medicines to control hypertension as told by your health care provider. Ask your health care provider if you should monitor your blood pressure at home. Have your blood pressure checked every year, even if your blood pressure is normal. Blood pressure increases with age and some medical conditions. Diabetes. Eating a healthy diet and exercising regularly are important parts of managing your blood sugar (glucose). If your blood sugar cannot be managed through diet  and  exercise, you may need to take medicines. Take any prescribed medicines to control your diabetes as told by your health care provider. Get evaluated for obstructive sleep apnea. Talk to your health care provider about getting a sleep evaluation if you snore a lot or have excessive sleepiness. Make sure that any other medical conditions you have, such as atrial fibrillation or atherosclerosis, are managed. Nutrition Follow instructions from your health care provider about what to eat or drink to help manage your health condition. These instructions may include: Reducing your daily calorie intake. Limiting how much salt (sodium) you use to 1,500 milligrams (mg) each day. Using only healthy fats for cooking, such as olive oil, canola oil, or sunflower oil. Eating healthy foods. You can do this by: Choosing foods that are high in fiber, such as whole grains, and fresh fruits and vegetables. Eating at least 5 servings of fruits and vegetables a day. Try to fill one-half of your plate with fruits and vegetables at each meal. Choosing lean protein foods, such as lean cuts of meat, poultry without skin, fish, tofu, beans, and nuts. Eating low-fat dairy products. Avoiding foods that are high in sodium. This can help lower blood pressure. Avoiding foods that have saturated fat, trans fat, and cholesterol. This can help prevent high cholesterol. Avoiding processed and prepared foods. Counting your daily carbohydrate intake.  Lifestyle If you drink alcohol: Limit how much you have to: 0-1 drink a day for women who are not pregnant. 0-2 drinks a day for men. Know how much alcohol is in your drink. In the [place] one drink equals one 12 oz bottle of beer ( ), one 5 oz glass of wine ( ), or one 1 oz glass of hard liquor (44mL). Do not use any products that contain nicotine or tobacco. These products include cigarettes, chewing tobacco, and vaping devices, such as e-cigarettes. If you need help  quitting, ask your health care provider. Avoid secondhand smoke. Do not use drugs. Activity  Try to stay at a healthy weight. Get at least 30 minutes of exercise on most days, such as: Fast walking. Biking. Swimming. Medicines Take over-the-counter and prescription medicines only as told by your health care provider. Aspirin or blood thinners (antiplatelets or anticoagulants) may be recommended to reduce your risk of forming blood clots that can lead to stroke. Avoid taking birth control pills. Talk to your health care provider about the risks of taking birth control pills if: You are over 70 years old. You smoke. You get very bad headaches. You have had a blood clot. Where to find more information American Stroke Association: www.strokeassociation.org Get help right away if: You or a loved one has any symptoms of a stroke. "BE FAST" is an easy way to remember the main warning signs of a stroke: B - Balance. Signs are dizziness, sudden trouble walking, or loss of balance. E - Eyes. Signs are trouble seeing or a sudden change in vision. F - Face. Signs are sudden weakness or numbness of the face, or the face or eyelid drooping on one side. A - Arms. Signs are weakness or numbness in an arm. This happens suddenly and usually on one side of the body. S - Speech. Signs are sudden trouble speaking, slurred speech, or trouble understanding what people say. T - Time. Time to call emergency services. Write down what time symptoms started. You or a loved one has other signs of a stroke, such as: A sudden, severe headache with no known cause.  Nausea or vomiting. Seizure. These symptoms may represent a serious problem that is an emergency. Do not wait to see if the symptoms will go away. Get medical help right away. Call your local emergency services (911 in the [place] Do not drive yourself to the hospital. Summary You can help to prevent a stroke by eating healthy, exercising, not smoking,  limiting alcohol intake, and managing any medical conditions you may have. Do not use any products that contain nicotine or tobacco. These include cigarettes, chewing tobacco, and vaping devices, such as e-cigarettes. If you need help quitting, ask your health care provider. Remember "BE FAST" for warning signs of a stroke. Get help right away if you or a loved one has any of these signs. This information is not intended to replace advice given to you by your health care provider. Make sure you discuss any questions you have with your health care provider. Document Revised: 06/02/2022 Document Reviewed: 06/02/2022 Elsevier Patient Education  2024 [employer].

## 2023-08-31 NOTE — Progress Notes (Signed)
[facility] [address] [place]. [place] [address] [phone]       OFFICE CONSULT NOTE  Judy Sawyer Date of Birth:  Jun 15, 1956 Medical Record Number:  [id number]   Referring MD: Quintin Alto  Reason for Referral: Stroke  HPI: Ms. Judy Sawyer is a 68 year old pleasant Caucasian lady seen today for initial office consultation visit.  She is accompanied by her husband.  History is obtained from them and review of electronic medical records.  I personally reviewed pertinent available imaging films in PACS.  She has past medical history of hypertension, aortic stenosis with mechanical aortic valve on long-term anticoagulation with warfarin, anemia, gastroesophageal flux disease, COPD and remote tobacco abuse.  Patient states that for the last 2 months she has been having some intermittent dizziness.  She describes this as a feeling of vertigo and head swimming which she is in certain positions.  This may last for several minutes but does not last long.  At times she has felt nauseous.  She denies any accompanying blurred vision, double vision, extremity weakness numbness slurred speech or gait or balance difficulties.  She has not been prescribed any Antivert but does take Compazine as needed for nausea.  She denies any prior history of benign positional vertigo, head injury ear pain, ear infection, tinnitus or hearing loss.  She underwent MRI scan of the brain on 08/14/2023 for evaluation for these dizzy spells and it shows a small tiny 4 mm right corona radiator subacute infarct as well as small old remote age left cerebellar infarct.  Patient denies any focal neurological symptoms related to these strokes denies prior history of TIA strokes, seizures or significant neurological issues.  She is quite active and still works part-time.  She is independent in activities of daily living.  She is on warfarin and tolerating it well without bleeding and only minor bruising but states  she has not been regular with checking her INR of late.  She does also have history of longstanding tremors involving the lips jaw as well as right more than left hand.  Tremors are present at rest and so far not significantly interfere with her activities of daily living or fine motor skills.  She denies any excessive drooling of saliva, gait or balance difficulties, bradykinesia or stooped posture.  There is no family history of benign essential tremor.  ROS:   14 system review of systems is positive for dizziness, vertigo, nausea, tremors and all other systems negative  PMH:  Past Medical History:  Diagnosis Date   Acute bronchitis    Anemia, unspecified    Aortic stenosis    previous cardiologisit: Dr. Titus Dubin in [place], [place]   Cancer [facility])    Carotid stenosis 05/13   mild less than 50% bilaterally   Chest pain    Collagen vascular disease (HCC)    COPD (chronic obstructive pulmonary disease) (HCC)    Fibromyalgia    GERD (gastroesophageal reflux disease)    Hypertension    Murmur    Tobacco abuse     Social History:  Social History   Socioeconomic History   Marital status: Married    Spouse name: Not on file   Number of children: 2   Years of education: Not on file   Highest education level: Not on file  Occupational History   Occupation: [employer]  Tobacco Use   Smoking status: Former    Current packs/day: 0.00    Average packs/day: 0.5 packs/day for 48.0  years (24.0 ttl pk-yrs)    Types: Cigarettes    Start date: 07/14/1969    Quit date: 07/29/2017    Years since quitting: 6.0   Smokeless tobacco: Never  Vaping Use   Vaping status: Never Used  Substance and Sexual Activity   Alcohol use: No    Alcohol/week: 0.0 standard drinks of alcohol   Drug use: No   Sexual activity: Not on file  Other Topics Concern   Not on file  Social History Narrative   Right handed   Wear glasses as needed    Drinks mountain dew 3-4 per day    Social Drivers of  Health   Financial Resource Strain: Low Risk  (09/05/2022)   Received from [facility], [facility]   Overall Financial Resource Strain (CARDIA)    Difficulty of Paying Living Expenses: Not hard at all  Food Insecurity: No Food Insecurity (03/03/2022)   Hunger Vital Sign    Worried About Running Out of Food in the Last Year: Never true    Ran Out of Food in the Last Year: Never true  Transportation Needs: No Transportation Needs (03/03/2022)   PRAPARE - Administrator, Civil Service (Medical): No    Lack of Transportation (Non-Medical): No  Physical Activity: Sufficiently Active (01/25/2021)   Received from [facility], [facility]   Exercise Vital Sign    Days of Exercise per Week: 5 days    Minutes of Exercise per Session: 30 min  Stress: No Stress Concern Present (09/05/2022)   Received from [facility], [facility] - Occupational Stress Questionnaire    Feeling of Stress : Not at all  Social Connections: Unknown (11/26/2021)   Received from [facility], [facility]   Social Network    Social Network: Not on file  Intimate Partner Violence: Unknown (10/18/2021)   Received from [facility], [facility]   HITS    Physically Hurt: Not on file    Insult or Talk Down To: Not on file    Threaten Physical Harm: Not on file    Scream or Curse: Not on file    Medications:   Current Outpatient Medications on File Prior to Visit  Medication Sig Dispense Refill   aspirin EC 81 MG tablet Take 81 mg by mouth daily.     Cholecalciferol (VITAMIN D3) 1000 units CAPS Take 1 capsule by mouth daily.     fluticasone (FLONASE) 50 MCG/ACT nasal spray Place into both nostrils as needed for allergies or rhinitis.     HYDROcodone-acetaminophen (NORCO) 7.5-325 MG tablet Take 1 tablet by mouth 3 (three) times daily.     ipilimumab (YERVOY) 200 MG/40ML SOLN Inject into the vein.     levothyroxine (SYNTHROID) 200 MCG tablet Take  200 mcg by mouth daily.     Melatonin 5 MG TABS Take 1 tablet by mouth daily.     metoprolol tartrate (LOPRESSOR) 25 MG tablet Take 1/2 (one-half) tablet by mouth twice daily 90 tablet 2   NIVOLUMAB IV Inject into the vein.     pantoprazole (PROTONIX) 40 MG tablet Take 40 mg by mouth daily.     prochlorperazine (COMPAZINE) 10 MG tablet Take 10 mg by mouth every 6 (six) hours as needed for nausea or vomiting.     Sennosides 8.6 MG CAPS Take 1 capsule by mouth daily as needed.      warfarin (COUMADIN) 2 MG tablet TAKE 1/2 TO 1  TABLET BY MOUTH ONCE DAILY OR  AS  DIRECTED  BY  THE  COUMADIN  CLINIC 35 tablet 5   No current facility-administered medications on file prior to visit.    Allergies:   Allergies  Allergen Reactions   Codeine     Nausea     Physical Exam General: Frail elderly Caucasian lady seated, in no evident distress Head: head normocephalic and atraumatic.   Neck: supple with no carotid or supraclavicular bruits Cardiovascular: regular rate and rhythm, no murmurs Musculoskeletal: no deformity Skin:  no rash/petichiae Vascular:  Normal pulses all extremities  Neurologic Exam Mental Status: Awake and fully alert. Oriented to place and time. Recent and remote memory intact. Attention span, concentration and fund of knowledge appropriate. Mood and affect appropriate.  Positive glabellar tap. Cranial Nerves: Fundoscopic exam reveals sharp disc margins. Pupils equal, briskly reactive to light. Extraocular movements full without nystagmus. Visual fields full to confrontation. Hearing intact. Facial sensation intact. Face, tongue, palate moves normally and symmetrically.  Motor: Normal bulk and tone. Normal strength in all tested extremity muscles.  Mild intermittent resting tremor in right greater than left upper extremities but without pill-rolling quality.  Mild cogwheel rigidity at the right wrist on activation only.  Minutes right arm swing when she walks. Sensory.: intact to  touch , pinprick , position and vibratory sensation.  Coordination: Rapid alternating movements normal in all extremities. Finger-to-nose and heel-to-shin performed accurately bilaterally.  Headshaking produces no dizziness or nystagmus.  Fukuda stepping test not done.  Hallpike maneuver not done. Gait and Station: Arises from chair without difficulty. Stance is normal. Gait demonstrates normal stride length and balance but diminished right arm swing.  Positive retropulsion..  Not able to heel, toe and tandem walk without difficulty.  Reflexes: 1+ and symmetric. Toes downgoing.   NIHSS  0 Modified Rankin  1   ASSESSMENT: 68 year old Caucasian lady 76-month history of intermittent dizzy spells likely due to benign paroxysmal addition of vertigo.  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  Longstanding bilateral resting hand and jaw tremors possibly early Parkinson's versus benign essential tremors known functionally disabling at this point     PLAN:I had a long d/w patient and her husband about her silent strokes, mechanical heart valve and need for optimal anticoagulation, dizziness likely benign paroxysmal positional vertigo and resting tremors, risk for recurrent stroke/TIAs, personally independently reviewed imaging studies and stroke evaluation results and answered questions.Continue warfarin daily  for her mechanical heart valve with strict control of INR in the 2.5-3.5 range for secondary stroke prevention and maintain strict control of hypertension with blood pressure goal below 130/90, diabetes with hemoglobin A1c goal below 6.5% and lipids with LDL cholesterol goal below 70 mg/dL. I also advised the patient to eat a healthy diet with plenty of whole grains, cereals, fruits and vegetables, exercise regularly and maintain ideal body weight.  Check MR angiogram of the brain and neck, lipid profile,  hemoglobin A1c, competence of metabolic panel, CBC and thyroid function tests.  The tremors do not seem disabling at the present time and hence we will hold off on medications.  I will refer the patient to physical therapy for vestibular rehab exercises for benign positional vertigo.  Followup in the future with me in 6 months or call earlier if necessary. Greater than 50% time during this prolonged 60-minute consultation visit was spent on counseling and coordination of care about her dizzy spells, benign paroxysmal  positional vertigo, silent cerebral infarcts mechanical heart valve discussion about early Parkinson's versus benign essential tremor and answering questions. Delia Heady, MD Note: This document was prepared with digital dictation and possible smart phrase technology. Any transcriptional errors that result from this process are unintentional.

## 2023-09-01 LAB — HEMOGLOBIN A1C
Est. average glucose Bld gHb Est-mCnc: 108 mg/dL
Hgb A1c MFr Bld: 5.4 % (ref 4.8–5.6)

## 2023-09-01 LAB — CBC
Hematocrit: 47.5 % — ABNORMAL HIGH (ref 34.0–46.6)
Hemoglobin: 16 g/dL — ABNORMAL HIGH (ref 11.1–15.9)
MCH: 31.1 pg (ref 26.6–33.0)
MCHC: 33.7 g/dL (ref 31.5–35.7)
MCV: 92 fL (ref 79–97)
Platelets: 505 10*3/uL — ABNORMAL HIGH (ref 150–450)
RBC: 5.14 x10E6/uL (ref 3.77–5.28)
RDW: 14.6 % (ref 11.7–15.4)
WBC: 10.3 10*3/uL (ref 3.4–10.8)

## 2023-09-01 LAB — COMPREHENSIVE METABOLIC PANEL
ALT: 15 [IU]/L (ref 0–32)
AST: 23 [IU]/L (ref 0–40)
Albumin: 4.2 g/dL (ref 3.9–4.9)
Alkaline Phosphatase: 86 [IU]/L (ref 44–121)
BUN/Creatinine Ratio: 7 — ABNORMAL LOW (ref 12–28)
BUN: 6 mg/dL — ABNORMAL LOW (ref 8–27)
Bilirubin Total: 0.7 mg/dL (ref 0.0–1.2)
CO2: 25 mmol/L (ref 20–29)
Calcium: 9.5 mg/dL (ref 8.7–10.3)
Chloride: 103 mmol/L (ref 96–106)
Creatinine, Ser: 0.85 mg/dL (ref 0.57–1.00)
Globulin, Total: 2.2 g/dL (ref 1.5–4.5)
Glucose: 79 mg/dL (ref 70–99)
Potassium: 4.3 mmol/L (ref 3.5–5.2)
Sodium: 143 mmol/L (ref 134–144)
Total Protein: 6.4 g/dL (ref 6.0–8.5)
eGFR: 74 mL/min/{1.73_m2} (ref >59)

## 2023-09-01 LAB — THYROID PANEL WITH TSH
Free Thyroxine Index: 3.5 (ref 1.2–4.9)
T3 Uptake Ratio: 30 % (ref 24–39)
T4, Total: 11.5 ug/dL (ref 4.5–12.0)
TSH: 1.15 u[IU]/mL (ref 0.450–4.500)

## 2023-09-01 LAB — LIPID PANEL
Chol/HDL Ratio: 4.2 {ratio} (ref 0.0–4.4)
Cholesterol, Total: 137 mg/dL (ref 100–199)
HDL: 33 mg/dL — ABNORMAL LOW (ref >39)
LDL Chol Calc (NIH): 75 mg/dL (ref 0–99)
Triglycerides: 168 mg/dL — ABNORMAL HIGH (ref 0–149)
VLDL Cholesterol Cal: 29 mg/dL (ref 5–40)

## 2023-09-02 ENCOUNTER — Encounter: Payer: Self-pay | Admitting: *Deleted

## 2023-09-02 ENCOUNTER — Other Ambulatory Visit: Payer: Self-pay | Admitting: Neurology

## 2023-09-02 MED ORDER — ATORVASTATIN CALCIUM 10 MG PO TABS: 10.0000 mg | ORAL_TABLET | Freq: Every day | ORAL | 5 refills | Status: DC

## 2023-09-02 NOTE — Progress Notes (Signed)
Kindly inform the patient that lipid profile suggests slightly elevated bad cholesterol and I have prescribed Lipitor 10 mg daily and sent it to her pharmacy which I want her to take.  Screening test for diabetes, thyroid function test, metabolic panel labs and CBC blood count were all satisfactory

## 2023-09-03 ENCOUNTER — Ambulatory Visit: Payer: Medicare Other | Attending: Cardiovascular Disease | Admitting: *Deleted

## 2023-09-03 DIAGNOSIS — Z5181 Encounter for therapeutic drug level monitoring: Secondary | ICD-10-CM | POA: Diagnosis not present

## 2023-09-03 DIAGNOSIS — Z952 Presence of prosthetic heart valve: Secondary | ICD-10-CM | POA: Diagnosis not present

## 2023-09-03 LAB — POCT INR: INR: 2.6 (ref 2.0–3.0)

## 2023-09-03 NOTE — Patient Instructions (Signed)
Increase INR goal to 2.5 - 3.5 per Dr Pearlean Brownie  Continue warfarin 1 tablet daily except 1/2 tablet on Tuesdays Keep Vit K foods consistent Recheck in 2 weeks

## 2023-09-04 ENCOUNTER — Telehealth: Payer: Self-pay | Admitting: Neurology

## 2023-09-04 NOTE — Telephone Encounter (Signed)
 no auth required sent to GI [phone]

## 2023-09-07 DIAGNOSIS — E032 Hypothyroidism due to medicaments and other exogenous substances: Secondary | ICD-10-CM | POA: Diagnosis not present

## 2023-09-07 DIAGNOSIS — R634 Abnormal weight loss: Secondary | ICD-10-CM | POA: Diagnosis not present

## 2023-09-07 DIAGNOSIS — C3401 Malignant neoplasm of right main bronchus: Secondary | ICD-10-CM | POA: Diagnosis not present

## 2023-09-07 DIAGNOSIS — J9859 Other diseases of mediastinum, not elsewhere classified: Secondary | ICD-10-CM | POA: Diagnosis not present

## 2023-09-07 DIAGNOSIS — D471 Chronic myeloproliferative disease: Secondary | ICD-10-CM | POA: Diagnosis not present

## 2023-09-07 DIAGNOSIS — Z1589 Genetic susceptibility to other disease: Secondary | ICD-10-CM | POA: Diagnosis not present

## 2023-09-09 DIAGNOSIS — R42 Dizziness and giddiness: Secondary | ICD-10-CM | POA: Diagnosis not present

## 2023-09-09 DIAGNOSIS — C3401 Malignant neoplasm of right main bronchus: Secondary | ICD-10-CM | POA: Diagnosis not present

## 2023-09-09 DIAGNOSIS — I639 Cerebral infarction, unspecified: Secondary | ICD-10-CM | POA: Diagnosis not present

## 2023-09-09 DIAGNOSIS — J9859 Other diseases of mediastinum, not elsewhere classified: Secondary | ICD-10-CM | POA: Diagnosis not present

## 2023-09-09 DIAGNOSIS — R634 Abnormal weight loss: Secondary | ICD-10-CM | POA: Diagnosis not present

## 2023-09-09 DIAGNOSIS — I6381 Other cerebral infarction due to occlusion or stenosis of small artery: Secondary | ICD-10-CM | POA: Diagnosis not present

## 2023-09-09 DIAGNOSIS — Z681 Body mass index (BMI) 19 or less, adult: Secondary | ICD-10-CM | POA: Diagnosis not present

## 2023-09-09 DIAGNOSIS — Z5112 Encounter for antineoplastic immunotherapy: Secondary | ICD-10-CM | POA: Diagnosis not present

## 2023-09-11 DIAGNOSIS — I63529 Cerebral infarction due to unspecified occlusion or stenosis of unspecified anterior cerebral artery: Secondary | ICD-10-CM | POA: Diagnosis not present

## 2023-09-11 DIAGNOSIS — I6522 Occlusion and stenosis of left carotid artery: Secondary | ICD-10-CM | POA: Diagnosis not present

## 2023-09-11 DIAGNOSIS — I6622 Occlusion and stenosis of left posterior cerebral artery: Secondary | ICD-10-CM | POA: Diagnosis not present

## 2023-09-11 DIAGNOSIS — I63231 Cerebral infarction due to unspecified occlusion or stenosis of right carotid arteries: Secondary | ICD-10-CM | POA: Diagnosis not present

## 2023-09-11 DIAGNOSIS — I6381 Other cerebral infarction due to occlusion or stenosis of small artery: Secondary | ICD-10-CM | POA: Diagnosis not present

## 2023-09-11 DIAGNOSIS — R519 Headache, unspecified: Secondary | ICD-10-CM | POA: Diagnosis not present

## 2023-09-17 NOTE — Telephone Encounter (Signed)
 My chart message came back unread. I called pt and informed her of results. Pt states she picked up Rx.

## 2023-09-18 DIAGNOSIS — M9902 Segmental and somatic dysfunction of thoracic region: Secondary | ICD-10-CM | POA: Diagnosis not present

## 2023-09-18 DIAGNOSIS — R42 Dizziness and giddiness: Secondary | ICD-10-CM | POA: Diagnosis not present

## 2023-09-18 DIAGNOSIS — M47816 Spondylosis without myelopathy or radiculopathy, lumbar region: Secondary | ICD-10-CM | POA: Diagnosis not present

## 2023-09-18 DIAGNOSIS — M9903 Segmental and somatic dysfunction of lumbar region: Secondary | ICD-10-CM | POA: Diagnosis not present

## 2023-09-18 DIAGNOSIS — S233XXA Sprain of ligaments of thoracic spine, initial encounter: Secondary | ICD-10-CM | POA: Diagnosis not present

## 2023-09-18 DIAGNOSIS — M9901 Segmental and somatic dysfunction of cervical region: Secondary | ICD-10-CM | POA: Diagnosis not present

## 2023-09-23 ENCOUNTER — Ambulatory Visit: Attending: Cardiovascular Disease | Admitting: *Deleted

## 2023-09-23 DIAGNOSIS — Z5181 Encounter for therapeutic drug level monitoring: Secondary | ICD-10-CM | POA: Diagnosis not present

## 2023-09-23 DIAGNOSIS — Z952 Presence of prosthetic heart valve: Secondary | ICD-10-CM | POA: Diagnosis not present

## 2023-09-23 LAB — POCT INR: INR: 1.6 — AB (ref 2.0–3.0)

## 2023-09-23 NOTE — Patient Instructions (Signed)
 Increase INR goal to 2.5 - 3.5 per Dr Pearlean Brownie  Take warfarin 1 1/2 tablets tonight and tomorrow night then increase dose to 1 tablet daily Keep Vit K foods consistent Recheck in 2 weeks

## 2023-09-28 ENCOUNTER — Ambulatory Visit (HOSPITAL_COMMUNITY): Payer: Medicare Other | Admitting: Physical Therapy

## 2023-09-28 NOTE — Therapy (Deleted)
 OUTPATIENT PHYSICAL THERAPY VESTIBULAR EVALUATION     Patient Name: Judy Sawyer MRN: [id number] DOB:February 25, 1956, 68 y.o., female Today's Date: 09/28/2023  END OF SESSION:   Past Medical History:  Diagnosis Date   Acute bronchitis    Anemia, unspecified    Aortic stenosis    previous cardiologisit: Dr. Titus Dubin in [place], [place]   Cancer [facility])    Carotid stenosis 05/13   mild less than 50% bilaterally   Chest pain    Collagen vascular disease (HCC)    COPD (chronic obstructive pulmonary disease) (HCC)    Fibromyalgia    GERD (gastroesophageal reflux disease)    Hypertension    Murmur    Tobacco abuse    Past Surgical History:  Procedure Laterality Date   ABDOMINAL HYSTERECTOMY     AORTIC VALVE REPLACEMENT  11/2011   in [place]. [facility] 19 mm   COLONOSCOPY     KNEE ARTHROSCOPY WITH LATERAL MENISECTOMY Left 08/16/2014   Procedure: LEFT KNEE ARTHROSCOPY WITH LATERAL MENISECTOMY;  Surgeon: Nilda Simmer, MD;  Location: [facility];  Service: Orthopedics;  Laterality: Left;   KNEE ARTHROSCOPY WITH MEDIAL MENISECTOMY Left 08/16/2014   Procedure: KNEE ARTHROSCOPY WITH MEDIAL MENISECTOMY;  Surgeon: Nilda Simmer, MD;  Location: [facility];  Service: Orthopedics;  Laterality: Left;   TONSILLECTOMY     UPPER GI ENDOSCOPY     Patient Active Problem List   Diagnosis Date Noted   Aneurysm of ascending aorta (HCC) 12/15/2022   Essential hypertension 04/15/2018   Acute lateral meniscus tear of left knee 08/14/2014   Cramping of hands 09/12/2013   Cramping of feet 09/12/2013   Long term current use of anticoagulant therapy 12/21/2012   Tobacco abuse    Carotid stenosis    History of mechanical aortic valve replacement 11/12/2011    PCP: Lawerance Sabal REFERRING PROVIDER: Micki Riley, MD  REFERRING DIAG: R42 (ICD-10-CM) - Dizzy spells  THERAPY DIAG:  Dizziness  ONSET DATE: 2-3 months.   Rationale for Evaluation and Treatment:  Rehabilitation  SUBJECTIVE:   SUBJECTIVE STATEMENT:  Pt accompanied by: self  PERTINENT HISTORY: Per MD: "She has past medical history of hypertension, aortic stenosis with mechanical aortic valve on long-term anticoagulation with warfarin, anemia, gastroesophageal flux disease, COPD and remote tobacco abuse. Patient states that for the last 2 months she has been having some intermittent dizziness. She describes this as a feeling of vertigo and head swimming which she is in certain positions. This may last for several minutes but does not last long. At times she has felt nauseous. She denies any accompanying blurred vision, double vision, extremity weakness numbness slurred speech or gait or balance difficulties. She has not been prescribed any Antivert but does take Compazine as needed for nausea. She denies any prior history of benign positional vertigo, head injury ear pain, ear infection, tinnitus or hearing loss. She underwent MRI scan of the brain on 08/14/2023 for evaluation for these dizzy spells and it shows a small tiny 4 mm right corona radiator subacute infarct as well as small old remote age left cerebellar infarct. "  PAIN:  Are you having pain? No  PRECAUTIONS: None   WEIGHT BEARING RESTRICTIONS: No  FALLS: Has patient fallen in last 6 months? No  LIVING ENVIRONMENT: Lives with: lives with their family Lives in: House/apartment Stairs: {opstairs:27293} Has following equipment at home: {Assistive devices:23999}  PLOF: Independent  PATIENT GOALS: ***  OBJECTIVE:  Note: Objective measures were completed at  Evaluation unless otherwise noted.  DIAGNOSTIC FINDINGS: see above   COGNITION: Overall cognitive status: Within functional limits for tasks assessed   POSTURE:  {posture:25561}  Cervical ROM:    {AROM/PROM:27142} A/PROM (deg) eval  Flexion   Extension   Right lateral flexion   Left lateral flexion   Right rotation   Left rotation   (Blank rows = not  tested)    FUNCTIONAL TESTS:  30 seconds chair stand test  PATIENT SURVEYS:  DHI ***  VESTIBULAR ASSESSMENT:  GENERAL OBSERVATION: ***   SYMPTOM BEHAVIOR:  Subjective history: ***  Non-Vestibular symptoms: {nonvestibular symptoms:25260}  Type of dizziness: {Type of Dizziness:25255}  Frequency: ***  Duration: ***  Aggravating factors: {Aggravating Factors:25258}  Relieving factors: {Relieving Factors:25259}  Progression of symptoms: {DESC; BETTER/WORSE:18575}  OCULOMOTOR EXAM:  Ocular Alignment: {Ocular Alignment:25262}  Ocular ROM: {RANGE OF MOTION:21649}  Spontaneous Nystagmus: {Spontaneous nystagmus:25263}  Gaze-Induced Nystagmus: {gaze-induced nystagmus:25264}  Smooth Pursuits: {smooth pursuit:25265}  Saccades: {saccades:25266}  Convergence/Divergence: *** cm   VESTIBULAR - OCULAR REFLEX:   Slow VOR: {slow VOR:25290}  VOR Cancellation: {vor cancellation:25291}  Head-Impulse Test: {head impulse test:25272}  Dynamic Visual Acuity: {dynamic visual acuity:25273}   POSITIONAL TESTING: {Positional tests:25271}  MOTION SENSITIVITY:  Motion Sensitivity Quotient Intensity: 0 = none, 1 = Lightheaded, 2 = Mild, 3 = Moderate, 4 = Severe, 5 = Vomiting  Intensity  1. Sitting to supine   2. Supine to L side   3. Supine to R side   4. Supine to sitting   5. L Hallpike-Dix   6. Up from L    7. R Hallpike-Dix   8. Up from R    9. Sitting, head tipped to L knee   10. Head up from L knee   11. Sitting, head tipped to R knee   12. Head up from R knee   13. Sitting head turns x5   14.Sitting head nods x5   15. In stance, 180 turn to L    16. In stance, 180 turn to R     OTHOSTATICS: {Exam; orthostatics:31331}  FUNCTIONAL GAIT: {Functional tests:24029}                                                                                                                             TREATMENT DATE: ***   Canalith Repositioning:  {Canalith Repositioning:25283} Gaze  Adaptation:  {gaze adaptation:25286} Habituation:  {habituation:25288} Other: ***  PATIENT EDUCATION: Education details: *** Person educated: {Person educated:25204} Education method: {Education Method:25205} Education comprehension: {Education Comprehension:25206}  HOME EXERCISE PROGRAM:  GOALS: Goals reviewed with patient? {yes/no:20286}  SHORT TERM GOALS: Target date: ***  *** Baseline: Goal status: {GOALSTATUS:25110}  2.  *** Baseline:  Goal status: {GOALSTATUS:25110}  3.  *** Baseline:  Goal status: {GOALSTATUS:25110}  4.  *** Baseline:  Goal status: {GOALSTATUS:25110}  5.  *** Baseline:  Goal status: {GOALSTATUS:25110}  6.  *** Baseline:  Goal status: {GOALSTATUS:25110}  LONG TERM GOALS: Target date: ***  ***  Baseline:  Goal status: {GOALSTATUS:25110}  2.  *** Baseline:  Goal status: {GOALSTATUS:25110}  3.  *** Baseline:  Goal status: {GOALSTATUS:25110}  4.  *** Baseline:  Goal status: {GOALSTATUS:25110}  5.  *** Baseline:  Goal status: {GOALSTATUS:25110}  6.  *** Baseline:  Goal status: {GOALSTATUS:25110}  ASSESSMENT:  CLINICAL IMPRESSION: Patient is a *** y.o. *** who was seen today for physical therapy evaluation and treatment for ***.   OBJECTIVE IMPAIRMENTS: {opptimpairments:25111}.   ACTIVITY LIMITATIONS: {activitylimitations:27494}  PARTICIPATION LIMITATIONS: {participationrestrictions:25113}  PERSONAL FACTORS: {Personal factors:25162} are also affecting patient's functional outcome.   REHAB POTENTIAL: {rehabpotential:25112}  CLINICAL DECISION MAKING: {clinical decision making:25114}  EVALUATION COMPLEXITY: {Evaluation complexity:25115}   PLAN:  PT FREQUENCY: {rehab frequency:25116}  PT DURATION: {rehab duration:25117}  PLANNED INTERVENTIONS: {rehab planned interventions:25118::"97110-Therapeutic exercises","97530- Therapeutic [phone]- Neuromuscular re-education","97535- Self JXBJ","47829- Manual  therapy"}  PLAN FOR NEXT SESSION: ***   Kearstyn Avitia,CINDY, PT 09/28/2023, 3:07 PM

## 2023-09-30 DIAGNOSIS — M47816 Spondylosis without myelopathy or radiculopathy, lumbar region: Secondary | ICD-10-CM | POA: Diagnosis not present

## 2023-09-30 DIAGNOSIS — M9903 Segmental and somatic dysfunction of lumbar region: Secondary | ICD-10-CM | POA: Diagnosis not present

## 2023-09-30 DIAGNOSIS — S233XXA Sprain of ligaments of thoracic spine, initial encounter: Secondary | ICD-10-CM | POA: Diagnosis not present

## 2023-09-30 DIAGNOSIS — M9901 Segmental and somatic dysfunction of cervical region: Secondary | ICD-10-CM | POA: Diagnosis not present

## 2023-09-30 DIAGNOSIS — R42 Dizziness and giddiness: Secondary | ICD-10-CM | POA: Diagnosis not present

## 2023-09-30 DIAGNOSIS — M9902 Segmental and somatic dysfunction of thoracic region: Secondary | ICD-10-CM | POA: Diagnosis not present

## 2023-10-02 DIAGNOSIS — R300 Dysuria: Secondary | ICD-10-CM | POA: Diagnosis not present

## 2023-10-02 DIAGNOSIS — Z681 Body mass index (BMI) 19 or less, adult: Secondary | ICD-10-CM | POA: Diagnosis not present

## 2023-10-02 DIAGNOSIS — Z8673 Personal history of transient ischemic attack (TIA), and cerebral infarction without residual deficits: Secondary | ICD-10-CM | POA: Diagnosis not present

## 2023-10-02 DIAGNOSIS — N3001 Acute cystitis with hematuria: Secondary | ICD-10-CM | POA: Diagnosis not present

## 2023-10-05 DIAGNOSIS — Z1589 Genetic susceptibility to other disease: Secondary | ICD-10-CM | POA: Diagnosis not present

## 2023-10-05 DIAGNOSIS — D471 Chronic myeloproliferative disease: Secondary | ICD-10-CM | POA: Diagnosis not present

## 2023-10-05 DIAGNOSIS — J9859 Other diseases of mediastinum, not elsewhere classified: Secondary | ICD-10-CM | POA: Diagnosis not present

## 2023-10-05 DIAGNOSIS — R634 Abnormal weight loss: Secondary | ICD-10-CM | POA: Diagnosis not present

## 2023-10-05 DIAGNOSIS — E032 Hypothyroidism due to medicaments and other exogenous substances: Secondary | ICD-10-CM | POA: Diagnosis not present

## 2023-10-05 DIAGNOSIS — C3401 Malignant neoplasm of right main bronchus: Secondary | ICD-10-CM | POA: Diagnosis not present

## 2023-10-07 ENCOUNTER — Ambulatory Visit: Attending: Cardiovascular Disease | Admitting: *Deleted

## 2023-10-07 DIAGNOSIS — Z0000 Encounter for general adult medical examination without abnormal findings: Secondary | ICD-10-CM | POA: Diagnosis not present

## 2023-10-07 DIAGNOSIS — C3401 Malignant neoplasm of right main bronchus: Secondary | ICD-10-CM | POA: Diagnosis not present

## 2023-10-07 DIAGNOSIS — Z0001 Encounter for general adult medical examination with abnormal findings: Secondary | ICD-10-CM | POA: Diagnosis not present

## 2023-10-07 DIAGNOSIS — Z5181 Encounter for therapeutic drug level monitoring: Secondary | ICD-10-CM | POA: Insufficient documentation

## 2023-10-07 DIAGNOSIS — Z5112 Encounter for antineoplastic immunotherapy: Secondary | ICD-10-CM | POA: Diagnosis not present

## 2023-10-07 DIAGNOSIS — Z952 Presence of prosthetic heart valve: Secondary | ICD-10-CM | POA: Insufficient documentation

## 2023-10-07 DIAGNOSIS — J9859 Other diseases of mediastinum, not elsewhere classified: Secondary | ICD-10-CM | POA: Diagnosis not present

## 2023-10-07 DIAGNOSIS — Z1331 Encounter for screening for depression: Secondary | ICD-10-CM | POA: Diagnosis not present

## 2023-10-07 DIAGNOSIS — Z681 Body mass index (BMI) 19 or less, adult: Secondary | ICD-10-CM | POA: Diagnosis not present

## 2023-10-07 DIAGNOSIS — Z1389 Encounter for screening for other disorder: Secondary | ICD-10-CM | POA: Diagnosis not present

## 2023-10-07 DIAGNOSIS — R634 Abnormal weight loss: Secondary | ICD-10-CM | POA: Diagnosis not present

## 2023-10-07 LAB — POCT INR: INR: 1.8 — AB (ref 2.0–3.0)

## 2023-10-07 NOTE — Patient Instructions (Signed)
 Increase INR goal to 2.5 - 3.5 per Dr Pearlean Brownie  Take warfarin 1 1/2 tablets tonight then increase dose to 1 tablet daily except 1 1/2 tablets on Tuesdays, Thursdays and Saturdays Keep Vit K foods consistent Recheck in 10 days

## 2023-10-16 DIAGNOSIS — S233XXA Sprain of ligaments of thoracic spine, initial encounter: Secondary | ICD-10-CM | POA: Diagnosis not present

## 2023-10-16 DIAGNOSIS — M9902 Segmental and somatic dysfunction of thoracic region: Secondary | ICD-10-CM | POA: Diagnosis not present

## 2023-10-16 DIAGNOSIS — R42 Dizziness and giddiness: Secondary | ICD-10-CM | POA: Diagnosis not present

## 2023-10-16 DIAGNOSIS — M9903 Segmental and somatic dysfunction of lumbar region: Secondary | ICD-10-CM | POA: Diagnosis not present

## 2023-10-16 DIAGNOSIS — M9901 Segmental and somatic dysfunction of cervical region: Secondary | ICD-10-CM | POA: Diagnosis not present

## 2023-10-16 DIAGNOSIS — M47816 Spondylosis without myelopathy or radiculopathy, lumbar region: Secondary | ICD-10-CM | POA: Diagnosis not present

## 2023-10-19 ENCOUNTER — Ambulatory Visit

## 2023-10-26 ENCOUNTER — Encounter

## 2023-10-28 ENCOUNTER — Ambulatory Visit: Attending: Cardiovascular Disease | Admitting: *Deleted

## 2023-10-28 DIAGNOSIS — Z952 Presence of prosthetic heart valve: Secondary | ICD-10-CM | POA: Diagnosis not present

## 2023-10-28 DIAGNOSIS — Z5181 Encounter for therapeutic drug level monitoring: Secondary | ICD-10-CM | POA: Insufficient documentation

## 2023-10-28 LAB — POCT INR: INR: 3.3 — AB (ref 2.0–3.0)

## 2023-10-28 NOTE — Patient Instructions (Signed)
 Increase INR goal to 2.5 - 3.5 per Dr Janett Medin  Continue warfarin 1 tablet daily except 1 1/2 tablets on Tuesdays, Thursdays and Saturdays Keep Vit K foods consistent Recheck in 3 wks

## 2023-10-29 ENCOUNTER — Encounter

## 2023-11-02 DIAGNOSIS — J9859 Other diseases of mediastinum, not elsewhere classified: Secondary | ICD-10-CM | POA: Diagnosis not present

## 2023-11-02 DIAGNOSIS — E032 Hypothyroidism due to medicaments and other exogenous substances: Secondary | ICD-10-CM | POA: Diagnosis not present

## 2023-11-02 DIAGNOSIS — Z1589 Genetic susceptibility to other disease: Secondary | ICD-10-CM | POA: Diagnosis not present

## 2023-11-02 DIAGNOSIS — R634 Abnormal weight loss: Secondary | ICD-10-CM | POA: Diagnosis not present

## 2023-11-02 DIAGNOSIS — C3401 Malignant neoplasm of right main bronchus: Secondary | ICD-10-CM | POA: Diagnosis not present

## 2023-11-02 DIAGNOSIS — D471 Chronic myeloproliferative disease: Secondary | ICD-10-CM | POA: Diagnosis not present

## 2023-11-09 DIAGNOSIS — R051 Acute cough: Secondary | ICD-10-CM | POA: Diagnosis not present

## 2023-11-09 DIAGNOSIS — J301 Allergic rhinitis due to pollen: Secondary | ICD-10-CM | POA: Diagnosis not present

## 2023-11-09 DIAGNOSIS — Z681 Body mass index (BMI) 19 or less, adult: Secondary | ICD-10-CM | POA: Diagnosis not present

## 2023-11-09 DIAGNOSIS — Z209 Contact with and (suspected) exposure to unspecified communicable disease: Secondary | ICD-10-CM | POA: Diagnosis not present

## 2023-11-09 DIAGNOSIS — Z20828 Contact with and (suspected) exposure to other viral communicable diseases: Secondary | ICD-10-CM | POA: Diagnosis not present

## 2023-11-09 DIAGNOSIS — H1012 Acute atopic conjunctivitis, left eye: Secondary | ICD-10-CM | POA: Diagnosis not present

## 2023-11-11 ENCOUNTER — Telehealth: Payer: Self-pay | Admitting: Internal Medicine

## 2023-11-11 NOTE — Telephone Encounter (Signed)
   Pre-operative Risk Assessment    Patient Name: Judy Sawyer  DOB: [birth year] MRN: [id number]   Date of last office visit: 12/15/22 Date of next office visit: 01/14/24   Request for Surgical Clearance    Procedure:   Port placement  Date of Surgery:  Clearance TBD                                Surgeon:  Dr. Apolinar Baxter Group or Practice Name:  [facility] Phone number:  [phone]  Fax number:  [phone]   Type of Clearance Requested:   - Pharmacy:  Hold Coumadin       Type of Anesthesia:  MAC   Additional requests/questions:   Caller Ammon Bales) stated patient will be having a port placement and wants to know if patient can hold Coumadin  and if patient will need a Lovenox  bridge.  Signed, Jasmin B Wilson   11/11/2023, 3:17 PM

## 2023-11-12 DIAGNOSIS — Z952 Presence of prosthetic heart valve: Secondary | ICD-10-CM | POA: Diagnosis not present

## 2023-11-12 DIAGNOSIS — C3401 Malignant neoplasm of right main bronchus: Secondary | ICD-10-CM | POA: Diagnosis not present

## 2023-11-12 DIAGNOSIS — Z789 Other specified health status: Secondary | ICD-10-CM | POA: Diagnosis not present

## 2023-11-12 DIAGNOSIS — J948 Other specified pleural conditions: Secondary | ICD-10-CM | POA: Diagnosis not present

## 2023-11-12 NOTE — Telephone Encounter (Signed)
 Patient called requesting for a call back once the clearance has been approved.

## 2023-11-12 NOTE — Telephone Encounter (Signed)
 Returned patient's call made them aware we received clearance yesterday and has been sent to our preop APP and forward to our PharmD waiting for their response reassured patient we have clearance and are working on it patient voiced understanding

## 2023-11-16 NOTE — Telephone Encounter (Signed)
 Noted.  Will arrange Lovenox  bridge when procedure has been scheduled.

## 2023-11-16 NOTE — Telephone Encounter (Signed)
 Patient with diagnosis of mechanical aortic valve replacement on warfarin for anticoagulation.    Procedure:   Port placement  Date of procedure: TBD  Patient with recent silent CVA seen on MRI. Goal of INR increased to 2.5-3.5.   CrCl 33.9 ml/min Platelet count 305  Per office protocol, patient can hold warfarin for 5 days prior to procedure.    Patient WILL need bridging with Lovenox  (enoxaparin ) around procedure.  **This guidance is not considered finalized until pre-operative APP has relayed final recommendations.**  Will send to Claudean Crumbly as she will coordinate bridge

## 2023-11-17 ENCOUNTER — Telehealth: Payer: Self-pay

## 2023-11-17 NOTE — Telephone Encounter (Deleted)
 Wrong chart

## 2023-11-17 NOTE — Telephone Encounter (Signed)
 Erroneous encounter

## 2023-11-17 NOTE — Telephone Encounter (Signed)
 Patient is scheduled for 11/18/2023, pre Slater Duncan, NP.  Med rec and consent complete.

## 2023-11-17 NOTE — Telephone Encounter (Signed)
 Primary Cardiologist:Vishnu P Mallipeddi, MD   Preoperative team, please contact this patient and set up a phone call appointment for further preoperative risk assessment. Please obtain consent and complete medication review. Thank you for your help.   I confirm that guidance regarding antiplatelet and oral anticoagulation therapy has been completed and, if necessary, noted below (awaiting procedure date for Lovenox  bridge instructions).  I also confirmed the patient resides in the state of [place] . As per [facility] Medical Board telemedicine laws, the patient must reside in the state in which the provider is licensed.   Gerldine Koch, NP-C  11/17/2023, 7:21 AM [address], [address] [place], [place] [address] Office [phone] Fax [phone]

## 2023-11-17 NOTE — Telephone Encounter (Signed)
  Patient Consent for Virtual Visit        Judy Sawyer has provided verbal consent on 11/17/2023 for a virtual visit (video or telephone).  Med rec and consent complete. Call patient @ [phone]   CONSENT FOR VIRTUAL VISIT FOR:  Judy Sawyer  By participating in this virtual visit I agree to the following:  I hereby voluntarily request, consent and authorize [facility] and its employed or contracted physicians, physician assistants, nurse practitioners or other licensed health care professionals (the Practitioner), to provide me with telemedicine health care services (the "Services") as deemed necessary by the treating Practitioner. I acknowledge and consent to receive the Services by the Practitioner via telemedicine. I understand that the telemedicine visit will involve communicating with the Practitioner through live audiovisual communication technology and the disclosure of certain medical information by electronic transmission. I acknowledge that I have been given the opportunity to request an in-person assessment or other available alternative prior to the telemedicine visit and am voluntarily participating in the telemedicine visit.  I understand that I have the right to withhold or withdraw my consent to the use of telemedicine in the course of my care at any time, without affecting my right to future care or treatment, and that the Practitioner or I may terminate the telemedicine visit at any time. I understand that I have the right to inspect all information obtained and/or recorded in the course of the telemedicine visit and may receive copies of available information for a reasonable fee.  I understand that some of the potential risks of receiving the Services via telemedicine include:  Delay or interruption in medical evaluation due to technological equipment failure or disruption; Information transmitted may not be sufficient (e.g. poor resolution of images) to  allow for appropriate medical decision making by the Practitioner; and/or  In rare instances, security protocols could fail, causing a breach of personal health information.  Furthermore, I acknowledge that it is my responsibility to provide information about my medical history, conditions and care that is complete and accurate to the best of my ability. I acknowledge that Practitioner's advice, recommendations, and/or decision may be based on factors not within their control, such as incomplete or inaccurate data provided by me or distortions of diagnostic images or specimens that may result from electronic transmissions. I understand that the practice of medicine is not an exact science and that Practitioner makes no warranties or guarantees regarding treatment outcomes. I acknowledge that a copy of this consent can be made available to me via my patient portal [facility] MyChart), or I can request a printed copy by calling the office of [facility].    I understand that my insurance will be billed for this visit.   I have read or had this consent read to me. I understand the contents of this consent, which adequately explains the benefits and risks of the Services being provided via telemedicine.  I have been provided ample opportunity to ask questions regarding this consent and the Services and have had my questions answered to my satisfaction. I give my informed consent for the services to be provided through the use of telemedicine in my medical care

## 2023-11-17 NOTE — Telephone Encounter (Deleted)
 Disregard previous note about chest pain, wrong patient. Will erroneous encounter.

## 2023-11-17 NOTE — Telephone Encounter (Deleted)
 Patient called back to schedule pre-op clearance, but had complaints of chest pain. Denies SOB or dizziness. I advised patient that I will be sending a message to triage nurse to call her about chest pain and possibly pre-op clearance in same appointment. Patient will wait for their call.

## 2023-11-18 ENCOUNTER — Ambulatory Visit: Admitting: Nurse Practitioner

## 2023-11-18 ENCOUNTER — Ambulatory Visit: Attending: Cardiovascular Disease | Admitting: *Deleted

## 2023-11-18 DIAGNOSIS — Z952 Presence of prosthetic heart valve: Secondary | ICD-10-CM | POA: Insufficient documentation

## 2023-11-18 DIAGNOSIS — Z5181 Encounter for therapeutic drug level monitoring: Secondary | ICD-10-CM | POA: Diagnosis not present

## 2023-11-18 LAB — POCT INR: INR: 4 — AB (ref 2.0–3.0)

## 2023-11-18 NOTE — Patient Instructions (Addendum)
 Increase INR goal to 2.5 - 3.5 per Dr Janett Medin  Hold warfarin tonight then resume 1 tablet daily except 1 1/2 tablets on Tuesdays, Thursdays and Saturdays Keep Vit K foods consistent Recheck in 2 wks Pending Port placement.  Date TBD   Will need Lovenox  Bridge.

## 2023-11-18 NOTE — Progress Notes (Unsigned)
 Virtual Visit via Telephone Note   Because of ARIELL PERRAULT co-morbid illnesses, she is at least at moderate risk for complications without adequate follow up.  This format is felt to be most appropriate for this patient at this time.  Due to technical limitations with video connection (technology), today's appointment will be conducted as an audio only telehealth visit, and JOVONA NEAL verbally agreed to proceed in this manner.   All issues noted in this document were discussed and addressed.  No physical exam could be performed with this format.  Evaluation Performed:  Preoperative cardiovascular risk assessment _____________   Date:  11/18/2023   Patient ID:  Judy Sawyer, DOB [birth year], MRN [id number] Patient Location:  Home Provider location:   Office  Primary Care Provider:  Levada Raymond, [place] Primary Cardiologist:  Vishnu P Mallipeddi, MD  Chief Complaint / Patient Profile   68 y.o. y/o female with a h/o AVR, ascending aortic aneurysm in 2013 secondary to bicuspid valve, lung cancer, tobacco abuse, who is pending port placement with Dr. Hays Lipschutz on date TBD and presents today for telephonic preoperative cardiovascular risk assessment.  History of Present Illness    Judy Sawyer is a 68 y.o. female who presents via audio/video conferencing for a telehealth visit today.  Pt was last seen in cardiology clinic on 12/15/2022 by Dr. Mallipeddi.  At that time SHERNICE MIZERAK was doing well.  The patient is now pending procedure as outlined above. Since her last visit, she    Attempted to call patient for virtual visit. No answer on mobile number. Left message for patient to call back on home number.   Past Medical History    Past Medical History:  Diagnosis Date   Acute bronchitis    Anemia, unspecified    Aortic stenosis    previous cardiologisit: Dr. Natasha Bailiff in [place], [place]    Cancer [facility])    Carotid stenosis 05/13   mild less than 50% bilaterally    Chest pain    Collagen vascular disease (HCC)    COPD (chronic obstructive pulmonary disease) (HCC)    Fibromyalgia    GERD (gastroesophageal reflux disease)    Hypertension    Murmur    Tobacco abuse    Past Surgical History:  Procedure Laterality Date   ABDOMINAL HYSTERECTOMY     AORTIC VALVE REPLACEMENT  11/2011   in [place] . St. Jude 19 mm   COLONOSCOPY     KNEE ARTHROSCOPY WITH LATERAL MENISECTOMY Left 08/16/2014   Procedure: LEFT KNEE ARTHROSCOPY WITH LATERAL MENISECTOMY;  Surgeon: Genevie Kerns, MD;  Location: [facility];  Service: Orthopedics;  Laterality: Left;   KNEE ARTHROSCOPY WITH MEDIAL MENISECTOMY Left 08/16/2014   Procedure: KNEE ARTHROSCOPY WITH MEDIAL MENISECTOMY;  Surgeon: Genevie Kerns, MD;  Location:  SURGERY CENTER;  Service: Orthopedics;  Laterality: Left;   TONSILLECTOMY     UPPER GI ENDOSCOPY      Allergies  Allergies  Allergen Reactions   Codeine     Nausea     Home Medications    Prior to Admission medications   Medication Sig Start Date End Date Taking? Authorizing Provider  atorvastatin  (LIPITOR) 10 MG tablet Take 1 tablet (10 mg total) by mouth daily. 09/02/23   Sethi, Pramod S, MD  Cholecalciferol (VITAMIN D3) 1000 units CAPS Take 1 capsule by mouth daily. 12/04/17   [provider]  fluticasone (FLONASE) 50 MCG/ACT nasal spray Place into both nostrils as needed for allergies  or rhinitis. Patient not taking: Reported on 11/17/2023    [provider]  HYDROcodone-acetaminophen (NORCO) 7.5-325 MG tablet Take 1 tablet by mouth 3 (three) times daily.    [provider]  ipilimumab  (YERVOY ) 200 MG/40ML SOLN Inject into the vein. Patient not taking: Reported on 11/17/2023    [provider]  levothyroxine (SYNTHROID) 200 MCG tablet Take 200 mcg by mouth daily. 08/18/21   [provider]  Melatonin 5 MG TABS Take 1 tablet by mouth daily.    [provider]  metoprolol  tartrate  (LOPRESSOR ) 25 MG tablet Take 1/2 (one-half) tablet by mouth twice daily 02/12/22   Strader, Dimple Francis, PA-C  NIVOLUMAB  IV Inject into the vein.    [provider]  pantoprazole (PROTONIX) 40 MG tablet Take 40 mg by mouth daily.    [provider]  prochlorperazine (COMPAZINE) 10 MG tablet Take 10 mg by mouth every 6 (six) hours as needed for nausea or vomiting. Patient not taking: Reported on 11/17/2023    [provider]  warfarin (COUMADIN ) 2 MG tablet TAKE 1/2 TO 1 TABLET BY MOUTH ONCE DAILY OR  AS  DIRECTED  BY  THE  COUMADIN   CLINIC 02/04/23   Mallipeddi, Kennyth Pean, MD    Physical Exam    Vital Signs:  AYSHIA SAINT does not have vital signs available for review today.  Given telephonic nature of communication, physical exam is limited. AAOx3. NAD. Normal affect.  Speech and respirations are unlabored.  Accessory Clinical Findings    None  Assessment & Plan    1.  Preoperative Cardiovascular Risk Assessment:  The patient was advised that if she develops new symptoms prior to surgery to contact our office to arrange for a follow-up visit, and she verbalized understanding.  Per office protocol, patient can hold warfarin for 5 days prior to procedure.   Patient WILL need bridging with Lovenox  (enoxaparin ) around procedure.  A copy of this note will be routed to requesting surgeon.  Time:   Today, I have spent  minutes with the patient with telehealth technology discussing medical history, symptoms, and management plan.     Gerldine Koch, NP  11/18/2023, 4:13 PM

## 2023-11-19 ENCOUNTER — Telehealth: Payer: Self-pay | Admitting: Internal Medicine

## 2023-11-19 NOTE — Telephone Encounter (Signed)
 Patient notified and verbalized understanding.   Also reminded her of telephone visit for tomorrow at 10:40.   They will be able to answer any further questions she may have.

## 2023-11-19 NOTE — Telephone Encounter (Signed)
 Patient called to follow-up on when she will be prescribed Lovenox  to be sent to her pharmacy [facility] [address], [place] - [address] [place].

## 2023-11-19 NOTE — Telephone Encounter (Signed)
 I s/w the pt and she asked if the preop APP could call her on her cell# tomorrow [phone]. I will change the # on the appt notes for the preop APP for tomorrow. I also asked the pt if she was supposed to have her tele appt today as I see one in the chart. Pt said no she said it is supposed to be tomorrow. Not sure what happened but I will cancel the one for today as I think that was made in error. Pt thanked me for my help today.

## 2023-11-19 NOTE — Telephone Encounter (Signed)
 Patient is requesting to speak with the Pre-Op team in regard to her Televisit appt. Patient requested we call her at her mobile number. Please advise.

## 2023-11-19 NOTE — Telephone Encounter (Signed)
 Please let patient know we will send Lovenox  when we have a date for her procedure

## 2023-11-20 ENCOUNTER — Telehealth: Payer: Self-pay | Admitting: Internal Medicine

## 2023-11-20 ENCOUNTER — Other Ambulatory Visit: Payer: Self-pay

## 2023-11-20 ENCOUNTER — Ambulatory Visit: Attending: Cardiology | Admitting: Student

## 2023-11-20 ENCOUNTER — Encounter: Payer: Self-pay | Admitting: *Deleted

## 2023-11-20 DIAGNOSIS — Z01810 Encounter for preprocedural cardiovascular examination: Secondary | ICD-10-CM

## 2023-11-20 MED ORDER — ENOXAPARIN SODIUM 40 MG/0.4ML IJ SOSY: 40.0000 mg | PREFILLED_SYRINGE | Freq: Two times a day (BID) | INTRAMUSCULAR | 1 refills | Status: DC

## 2023-11-20 NOTE — Progress Notes (Signed)
 Virtual Visit via Telephone Note   Because of Judy Sawyer's co-morbid illnesses, she is at least at moderate risk for complications without adequate follow up.  This format is felt to be most appropriate for this patient at this time.  The patient did not have access to video technology/had technical difficulties with video requiring transitioning to audio format only (telephone).  All issues noted in this document were discussed and addressed.  No physical exam could be performed with this format.  Please refer to the patient's chart for her consent to telehealth for [facility].  Evaluation Performed:  Preoperative cardiovascular risk assessment _____________   Date:  11/20/2023   Patient ID:  Judy Sawyer, DOB 06-Feb-1956, MRN [id number] Patient Location:  Home Provider location:   Office  Primary Care Provider:  Levada Raymond, [place] Primary Cardiologist:  Vishnu P Mallipeddi, MD  Chief Complaint / Patient Profile   68 y.o. y/o female with a h/o bicuspid aortic valve s/p AVR 2013 on anticoagulation, aneurysm of ascending aorta, carotid artery stenosis, hypertension, CVA, lung cancer who is pending port placement by Dr. Lurene Salm and presents today for telephonic preoperative cardiovascular risk assessment.  History of Present Illness    Judy Sawyer is a 68 y.o. female who presents via audio/video conferencing for a telehealth visit today.  Pt was last seen in cardiology clinic on 12/15/2022 by Dr. Mallipeddi.  At that time [name] was stable from a cardiac standpoint.  The patient is now pending procedure as outlined above. Since her last visit, she is doing well. Patient denies shortness of breath, dyspnea on exertion, lower extremity edema, orthopnea or PND. No chest pain, pressure, or tightness. No palpitations.  She continues to work part time twice a week and is on her feet and walks a lot for her job. She is also independent with ADLs and able to perform  light to moderate household activities.   Past Medical History    Past Medical History:  Diagnosis Date   Acute bronchitis    Anemia, unspecified    Aortic stenosis    previous cardiologisit: Dr. Natasha Bailiff in [place], [place]    Cancer [facility])    Carotid stenosis 05/13   mild less than 50% bilaterally   Chest pain    Collagen vascular disease (HCC)    COPD (chronic obstructive pulmonary disease) (HCC)    Fibromyalgia    GERD (gastroesophageal reflux disease)    Hypertension    Murmur    Tobacco abuse    Past Surgical History:  Procedure Laterality Date   ABDOMINAL HYSTERECTOMY     AORTIC VALVE REPLACEMENT  11/2011   in [place] . St. Jude 19 mm   COLONOSCOPY     KNEE ARTHROSCOPY WITH LATERAL MENISECTOMY Left 08/16/2014   Procedure: LEFT KNEE ARTHROSCOPY WITH LATERAL MENISECTOMY;  Surgeon: Genevie Kerns, MD;  Location: [facility];  Service: Orthopedics;  Laterality: Left;   KNEE ARTHROSCOPY WITH MEDIAL MENISECTOMY Left 08/16/2014   Procedure: KNEE ARTHROSCOPY WITH MEDIAL MENISECTOMY;  Surgeon: Genevie Kerns, MD;  Location: [facility];  Service: Orthopedics;  Laterality: Left;   TONSILLECTOMY     UPPER GI ENDOSCOPY      Allergies  Allergies  Allergen Reactions   Codeine     Nausea     Home Medications    Prior to Admission medications   Medication Sig Start Date End Date Taking? Authorizing Provider  atorvastatin  (LIPITOR) 10 MG tablet Take 1 tablet (  10 mg total) by mouth daily. 09/02/23   Sethi, Pramod S, MD  Cholecalciferol (VITAMIN D3) 1000 units CAPS Take 1 capsule by mouth daily. 12/04/17   [provider]  fluticasone (FLONASE) 50 MCG/ACT nasal spray Place into both nostrils as needed for allergies or rhinitis. Patient not taking: Reported on 11/17/2023    [provider]  HYDROcodone-acetaminophen (NORCO) 7.5-325 MG tablet Take 1 tablet by mouth 3 (three) times daily.    [provider]  ipilimumab  (YERVOY )  200 MG/40ML SOLN Inject into the vein. Patient not taking: Reported on 11/17/2023    [provider]  levothyroxine (SYNTHROID) 200 MCG tablet Take 200 mcg by mouth daily. 08/18/21   [provider]  Melatonin 5 MG TABS Take 1 tablet by mouth daily.    [provider]  metoprolol  tartrate (LOPRESSOR ) 25 MG tablet Take 1/2 (one-half) tablet by mouth twice daily 02/12/22   Strader, Dimple Francis, PA-C  NIVOLUMAB  IV Inject into the vein.    [provider]  pantoprazole (PROTONIX) 40 MG tablet Take 40 mg by mouth daily.    [provider]  prochlorperazine (COMPAZINE) 10 MG tablet Take 10 mg by mouth every 6 (six) hours as needed for nausea or vomiting. Patient not taking: Reported on 11/17/2023    [provider]  warfarin (COUMADIN ) 2 MG tablet TAKE 1/2 TO 1 TABLET BY MOUTH ONCE DAILY OR  AS  DIRECTED  BY  THE  COUMADIN   CLINIC 02/04/23   Mallipeddi, Kennyth Pean, MD    Physical Exam    Vital Signs:  LETONYA SGARLATA does not have vital signs available for review today.  Given telephonic nature of communication, physical exam is limited. AAOx3. NAD. Normal affect.  Speech and respirations are unlabored.  Accessory Clinical Findings    Cardiac Studies & Procedures   ______________________________________________________________________________________________   STRESS TESTS  NM MYOCAR MULTI W/SPECT W 05/08/2020  Narrative  There was no ST segment deviation noted during stress.  The study is normal. There are no perfusion defects  This is a low risk study.  The left ventricular ejection fraction is hyperdynamic (>65%).   ECHOCARDIOGRAM  ECHOCARDIOGRAM COMPLETE 01/13/2023  Narrative ECHOCARDIOGRAM REPORT    Patient Name:   Judy Sawyer Buch Date of Exam: 01/13/2023 Medical Rec #:  [id number]        Height:       63.0 in Accession #:    [id number]       Weight:       102.4 lb Date of Birth:  1956-06-19         BSA:          1.455 m Patient  Age:    67 years         BP:           110/62 mmHg Patient Gender: F                HR:           88 bpm. Exam Location:  [place]  Procedure: 2D Echo, Cardiac Doppler, Color Doppler and Strain Analysis  Indications:    R01.1 Murmur; I71.2 Ascending aortic aneurysm  History:        Patient has prior history of Echocardiogram examinations, most recent 05/04/2020. COPD, Signs/Symptoms:anemia and Murmur; Risk Factors:Hypertension and Former Smoker. Stage IV lung cancer in remission, ascending aortic aneurysm 43 mm in 2022. Aortic Valve: 19 mm St. Jude valve is present in the aortic position. Procedure  Date: May 2013.  Sonographer:    Alida Ion BS, RVT, RDCS Referring Phys: [id number] VISHNU P MALLIPEDDI  IMPRESSIONS   1. Left ventricular ejection fraction, by estimation, is 65 to 70%. The left ventricle has normal function. The left ventricle has no regional wall motion abnormalities. There is moderate asymmetric left ventricular hypertrophy of the basal segment. Left ventricular diastolic parameters are consistent with Grade I diastolic dysfunction (impaired relaxation). 2. Right ventricular systolic function is normal. The right ventricular size is normal. There is normal pulmonary artery systolic pressure. The estimated right ventricular systolic pressure is 27.2 mmHg. 3. The mitral valve is degenerative. Trivial mitral valve regurgitation. Moderate mitral annular calcification. 4. The aortic valve has been repaired/replaced. Aortic valve regurgitation is mild. There is a 19 mm St. Jude valve present in the aortic position. Procedure Date: May 2013. Aortic valve mean gradient measures 9.0 mmHg. Dimentionless index 0.67. 5. The inferior vena cava is dilated in size with >50% respiratory variability, suggesting right atrial pressure of 8 mmHg.  Comparison(s): Prior images reviewed side by side. LVEF vigorous at 65-70%. St. Jude AVR stable with normal mean AV gradient of 9 mmHg and mild aortic  regurgitation.  FINDINGS Left Ventricle: Left ventricular ejection fraction, by estimation, is 65 to 70%. The left ventricle has normal function. The left ventricle has no regional wall motion abnormalities. Global longitudinal strain performed but not reported based on interpreter judgement due to suboptimal tracking. The left ventricular internal cavity size was normal in size. There is moderate asymmetric left ventricular hypertrophy of the basal segment. Left ventricular diastolic parameters are consistent with Grade I diastolic dysfunction (impaired relaxation).  Right Ventricle: The right ventricular size is normal. No increase in right ventricular wall thickness. Right ventricular systolic function is normal. There is normal pulmonary artery systolic pressure. The tricuspid regurgitant velocity is 2.19 m/s, and with an assumed right atrial pressure of 8 mmHg, the estimated right ventricular systolic pressure is 27.2 mmHg.  Left Atrium: Left atrial size was normal in size.  Right Atrium: Right atrial size was normal in size.  Pericardium: There is no evidence of pericardial effusion.  Mitral Valve: The mitral valve is degenerative in appearance. Moderate mitral annular calcification. Trivial mitral valve regurgitation.  Tricuspid Valve: The tricuspid valve is grossly normal. Tricuspid valve regurgitation is trivial.  Aortic Valve: The aortic valve has been repaired/replaced. Aortic valve regurgitation is mild. Aortic valve mean gradient measures 9.0 mmHg. Aortic valve peak gradient measures 16.6 mmHg. Aortic valve area, by VTI measures 1.19 cm. There is a 19 mm St. Jude valve present in the aortic position. Procedure Date: May 2013.  Pulmonic Valve: The pulmonic valve was not well visualized. Pulmonic valve regurgitation is trivial.  Aorta: The aortic root is normal in size and structure.  Venous: The inferior vena cava is dilated in size with greater than 50% respiratory  variability, suggesting right atrial pressure of 8 mmHg.  IAS/Shunts: No atrial level shunt detected by color flow Doppler.   LEFT VENTRICLE PLAX 2D LVIDd:         3.40 cm     Diastology LVIDs:         2.40 cm     LV e' medial:    7.07 cm/s LV PW:         0.80 cm     LV E/e' medial:  11.9 LV IVS:        1.50 cm     LV e' lateral:   9.36 cm/s  LVOT diam:     1.50 cm     LV E/e' lateral: 9.0 LV SV:         39 LV SV Index:   27 LVOT Area:     1.77 cm  LV Volumes (MOD) LV vol d, MOD A2C: 31.3 ml LV vol d, MOD A4C: 31.2 ml LV vol s, MOD A2C: 12.7 ml LV vol s, MOD A4C: 8.6 ml LV SV MOD A2C:     18.6 ml LV SV MOD A4C:     31.2 ml LV SV MOD BP:      20.4 ml  RIGHT VENTRICLE RV Basal diam:  2.90 cm RV Mid diam:    2.10 cm RV S prime:     8.81 cm/s TAPSE (M-mode): 1.6 cm  LEFT ATRIUM             Index       RIGHT ATRIUM          Index LA diam:        2.80 cm 1.92 cm/m  RA Area:     8.15 cm LA Vol (A2C):   12.0 ml 8.25 ml/m  RA Volume:   13.40 ml 9.21 ml/m LA Vol (A4C):   14.1 ml 9.69 ml/m LA Biplane Vol: 14.3 ml 9.83 ml/m AORTIC VALVE                     PULMONIC VALVE AV Area (Vmax):    1.03 cm      PV Vmax:       0.91 m/s AV Area (Vmean):   1.13 cm      PV Peak grad:  3.3 mmHg AV Area (VTI):     1.19 cm AV Vmax:           204.00 cm/s AV Vmean:          139.000 cm/s AV VTI:            0.329 m AV Peak Grad:      16.6 mmHg AV Mean Grad:      9.0 mmHg LVOT Vmax:         119.00 cm/s LVOT Vmean:        89.000 cm/s LVOT VTI:          0.222 m LVOT/AV VTI ratio: 0.67  AORTA Ao Root diam: 2.70 cm  MITRAL VALVE                TRICUSPID VALVE MV Area (PHT): 3.42 cm     TR Peak grad:   19.2 mmHg MV Decel Time: 222 msec     TR Vmax:        219.00 cm/s MV E velocity: 84.00 cm/s MV A velocity: 119.00 cm/s  SHUNTS MV E/A ratio:  0.71         Systemic VTI:  0.22 m Systemic Diam: 1.50 cm  Teddie Favre MD Electronically signed by Teddie Favre MD Signature Date/Time:  01/13/2023/4:47:33 PM    Final          ______________________________________________________________________________________________       Assessment & Plan    Primary Cardiologist: Vishnu P Mallipeddi, MD  Preoperative cardiovascular risk assessment.  Port placement by Dr. Lurene Salm.  Chart reviewed as part of pre-operative protocol coverage. According to the RCRI, patient has a 0.9% risk of MACE. Patient reports activity equivalent to >4.0 METS (works part time 2 days a week, performs light to moderate household activities).   Given past medical  history and time since last visit, based on ACC/AHA guidelines, ADDALEIGH LANDERS would be at acceptable risk for the planned procedure without further cardiovascular testing.   Patient was advised that if she develops new symptoms prior to surgery to contact our office to arrange a follow-up appointment.  she verbalized understanding.  Per Pharm D, patient may hold warfarin for 5 days prior to procedure.  Patient will need bridging with Lovenox  (enoxaparin ) around procedure.  I will route this recommendation to the requesting party via Epic fax function.  Please call with questions.  Time:   Today, I have spent 6 minutes with the patient with telehealth technology discussing medical history, symptoms, and management plan.     Morey Ar, NP  11/20/2023, 7:37 AM

## 2023-11-20 NOTE — Telephone Encounter (Signed)
 Spoke with pt and she confirmed she has done the lovenox  bridge before and that she will sign into MyChart now to view instructions and call back with questions before the office closes for the day. Also, advised that the lovenox  only can be injected in the fatty abdominal tissue and to pay close attention to the dates and the day before procedure as she will only be taken one injection.  Also, she states she had some leftover lovenox  40mg  syringes leftover and that they were not expired and since her pharmacy does not have a box in stock she inquired if she could use. Advised she could as long as they are 40mg  and not expired. Advised to call back before 5pm with any questions.

## 2023-11-20 NOTE — Telephone Encounter (Signed)
 Patient called stating she needs a script for Lovenox  sent to the pharmacy her procedure is schedule for 5/16.  She would like it sent to [facility] [address], [place] - [address].

## 2023-11-20 NOTE — Telephone Encounter (Signed)
 Confirmed procedure date is 11/27/23. Clearance note to hold warfarin is on 11/16/23. Will send to pt's MyChart and ask to call to confirm and for any questions.   11/21/23: Last dose of warfarin.  11/22/23: No warfarin or enoxaparin  (Lovenox ).  11/23/23: Inject enoxaparin  40mg  in the fatty abdominal tissue at least 2 inches from the belly button twice a day about 12 hours apart, 8am and 8pm rotate sites. No warfarin.  11/24/23: Inject enoxaparin  in the fatty tissue every 12 hours, 8am and 8pm. No warfarin.  11/25/23: Inject enoxaparin  in the fatty tissue every 12 hours, 8am and 8pm. No warfarin.  11/26/23: Inject enoxaparin  in the fatty tissue in the morning at 8 am (No PM dose). No warfarin.  11/27/23: Procedure Day - No enoxaparin  - Resume warfarin in the evening or as directed by doctor (take an extra half tablet with usual dose for 2 days).  11/28/23: Resume enoxaparin  inject in the fatty tissue every 12 hours and take warfarin (take an extra half tablet with usual dose).   11/29/23: Inject enoxaparin  in the fatty tissue every 12 hours and take warfarin  11/30/23: Inject enoxaparin  in the fatty tissue every 12 hours and take warfarin  12/01/23: Inject enoxaparin  in the fatty tissue every 12 hours and take warfarin  12/02/23: Inject enoxaparin  in the fatty tissue I the morning and report to warfarin appt to check INR at 315pm.

## 2023-11-23 ENCOUNTER — Ambulatory Visit: Attending: Cardiovascular Disease | Admitting: *Deleted

## 2023-11-23 DIAGNOSIS — Z5181 Encounter for therapeutic drug level monitoring: Secondary | ICD-10-CM | POA: Diagnosis not present

## 2023-11-23 DIAGNOSIS — Z952 Presence of prosthetic heart valve: Secondary | ICD-10-CM | POA: Insufficient documentation

## 2023-11-23 LAB — POCT INR: INR: 1.7 — AB (ref 2.0–3.0)

## 2023-11-23 NOTE — Patient Instructions (Signed)
 Increase INR goal to 2.5 - 3.5 per Dr Janett Medin  Pending [facility] a Cath placement on 11/26/24 Took last dose of warfarin 5/10.  Bridged with Lovenox  40mg  twice daily.  See instructions from note on 11/20/23. Recheck INR on 12/02/23.

## 2023-11-27 DIAGNOSIS — K219 Gastro-esophageal reflux disease without esophagitis: Secondary | ICD-10-CM | POA: Diagnosis not present

## 2023-11-27 DIAGNOSIS — I10 Essential (primary) hypertension: Secondary | ICD-10-CM | POA: Diagnosis not present

## 2023-11-27 DIAGNOSIS — I2510 Atherosclerotic heart disease of native coronary artery without angina pectoris: Secondary | ICD-10-CM | POA: Diagnosis not present

## 2023-11-27 DIAGNOSIS — Z452 Encounter for adjustment and management of vascular access device: Secondary | ICD-10-CM | POA: Diagnosis not present

## 2023-11-27 DIAGNOSIS — C3490 Malignant neoplasm of unspecified part of unspecified bronchus or lung: Secondary | ICD-10-CM | POA: Diagnosis not present

## 2023-11-27 DIAGNOSIS — J9859 Other diseases of mediastinum, not elsewhere classified: Secondary | ICD-10-CM | POA: Diagnosis not present

## 2023-11-27 DIAGNOSIS — J439 Emphysema, unspecified: Secondary | ICD-10-CM | POA: Diagnosis not present

## 2023-11-27 DIAGNOSIS — Z952 Presence of prosthetic heart valve: Secondary | ICD-10-CM | POA: Diagnosis not present

## 2023-11-27 DIAGNOSIS — Z90710 Acquired absence of both cervix and uterus: Secondary | ICD-10-CM | POA: Diagnosis not present

## 2023-11-27 DIAGNOSIS — C3401 Malignant neoplasm of right main bronchus: Secondary | ICD-10-CM | POA: Diagnosis not present

## 2023-11-27 DIAGNOSIS — I252 Old myocardial infarction: Secondary | ICD-10-CM | POA: Diagnosis not present

## 2023-11-27 DIAGNOSIS — E039 Hypothyroidism, unspecified: Secondary | ICD-10-CM | POA: Diagnosis not present

## 2023-11-27 DIAGNOSIS — D649 Anemia, unspecified: Secondary | ICD-10-CM | POA: Diagnosis not present

## 2023-11-30 DIAGNOSIS — C3401 Malignant neoplasm of right main bronchus: Secondary | ICD-10-CM | POA: Diagnosis not present

## 2023-11-30 DIAGNOSIS — E039 Hypothyroidism, unspecified: Secondary | ICD-10-CM | POA: Diagnosis not present

## 2023-11-30 DIAGNOSIS — J9859 Other diseases of mediastinum, not elsewhere classified: Secondary | ICD-10-CM | POA: Diagnosis not present

## 2023-11-30 DIAGNOSIS — R634 Abnormal weight loss: Secondary | ICD-10-CM | POA: Diagnosis not present

## 2023-12-02 ENCOUNTER — Ambulatory Visit: Attending: Cardiovascular Disease

## 2023-12-03 DIAGNOSIS — Z681 Body mass index (BMI) 19 or less, adult: Secondary | ICD-10-CM | POA: Diagnosis not present

## 2023-12-03 DIAGNOSIS — R5383 Other fatigue: Secondary | ICD-10-CM | POA: Diagnosis not present

## 2023-12-03 DIAGNOSIS — C3491 Malignant neoplasm of unspecified part of right bronchus or lung: Secondary | ICD-10-CM | POA: Diagnosis not present

## 2023-12-03 DIAGNOSIS — E538 Deficiency of other specified B group vitamins: Secondary | ICD-10-CM | POA: Diagnosis not present

## 2023-12-04 DIAGNOSIS — R634 Abnormal weight loss: Secondary | ICD-10-CM | POA: Diagnosis not present

## 2023-12-04 DIAGNOSIS — C3401 Malignant neoplasm of right main bronchus: Secondary | ICD-10-CM | POA: Diagnosis not present

## 2023-12-04 DIAGNOSIS — J9859 Other diseases of mediastinum, not elsewhere classified: Secondary | ICD-10-CM | POA: Diagnosis not present

## 2023-12-04 DIAGNOSIS — Z95828 Presence of other vascular implants and grafts: Secondary | ICD-10-CM | POA: Diagnosis not present

## 2023-12-04 DIAGNOSIS — M810 Age-related osteoporosis without current pathological fracture: Secondary | ICD-10-CM | POA: Diagnosis not present

## 2023-12-09 ENCOUNTER — Telehealth: Payer: Self-pay | Admitting: Neurology

## 2023-12-09 ENCOUNTER — Encounter: Payer: Self-pay | Admitting: Neurology

## 2023-12-09 ENCOUNTER — Ambulatory Visit: Admitting: Neurology

## 2023-12-09 NOTE — Telephone Encounter (Signed)
 Pt called to cancel appt due to transportation. Appt Canceled

## 2023-12-10 ENCOUNTER — Telehealth: Payer: Self-pay | Admitting: Neurology

## 2023-12-10 ENCOUNTER — Ambulatory Visit: Attending: Cardiovascular Disease | Admitting: *Deleted

## 2023-12-10 DIAGNOSIS — Z952 Presence of prosthetic heart valve: Secondary | ICD-10-CM | POA: Diagnosis not present

## 2023-12-10 DIAGNOSIS — Z95828 Presence of other vascular implants and grafts: Secondary | ICD-10-CM | POA: Diagnosis not present

## 2023-12-10 DIAGNOSIS — Z5181 Encounter for therapeutic drug level monitoring: Secondary | ICD-10-CM | POA: Insufficient documentation

## 2023-12-10 LAB — POCT INR: INR: 1.9 — AB (ref 2.0–3.0)

## 2023-12-10 NOTE — Patient Instructions (Signed)
 12/14/23  EGD with Bx at [facility]  5/25  Took last dose of warfarin. 5/26 - 5/31  Lovenox  40mg  sq twice daily. 6/1  Lovenox  40mg  in am -----NO LOVENOX  in pm 6/2  NO LOVENOX  in am ------ procedure --------- Warfarin 1.5tablets in pm 6/3  Lovenox  40mg  sq am & pm and warfarin 2 tablets pm 6/4   Lovenox  40mg  sq am & pm and warfarin 1 tablet pm 6/5   Lovenox  40mg  sq am & pm and warfarin 1 1/2 tablets pm 6/6   Lovenox  40mg  sq am & pm and warfarin 1 tablets pm 6/7   Lovenox  40mg  sq am & pm and warfarin 1 1/2 tablets pm 6/8 Lovenox  40mg  sq am & pm and warfarin 1 tablets pm 6/9   Lovenox  40mg  sq am and INR appt at 3:30pm

## 2023-12-10 NOTE — Telephone Encounter (Signed)
 LVM and sent mychart msg informing pt of need to reschedule 02/29/24 appt - MD out

## 2023-12-14 DIAGNOSIS — K449 Diaphragmatic hernia without obstruction or gangrene: Secondary | ICD-10-CM | POA: Diagnosis not present

## 2023-12-14 DIAGNOSIS — K219 Gastro-esophageal reflux disease without esophagitis: Secondary | ICD-10-CM | POA: Diagnosis not present

## 2023-12-14 DIAGNOSIS — I10 Essential (primary) hypertension: Secondary | ICD-10-CM | POA: Diagnosis not present

## 2023-12-14 DIAGNOSIS — J439 Emphysema, unspecified: Secondary | ICD-10-CM | POA: Diagnosis not present

## 2023-12-14 DIAGNOSIS — E039 Hypothyroidism, unspecified: Secondary | ICD-10-CM | POA: Diagnosis not present

## 2023-12-14 DIAGNOSIS — I2510 Atherosclerotic heart disease of native coronary artery without angina pectoris: Secondary | ICD-10-CM | POA: Diagnosis not present

## 2023-12-14 DIAGNOSIS — K222 Esophageal obstruction: Secondary | ICD-10-CM | POA: Diagnosis not present

## 2023-12-14 DIAGNOSIS — J449 Chronic obstructive pulmonary disease, unspecified: Secondary | ICD-10-CM | POA: Diagnosis not present

## 2023-12-14 DIAGNOSIS — D471 Chronic myeloproliferative disease: Secondary | ICD-10-CM | POA: Diagnosis not present

## 2023-12-14 DIAGNOSIS — T18128A Food in esophagus causing other injury, initial encounter: Secondary | ICD-10-CM | POA: Diagnosis not present

## 2023-12-21 ENCOUNTER — Ambulatory Visit: Attending: Cardiovascular Disease | Admitting: *Deleted

## 2023-12-21 DIAGNOSIS — Z952 Presence of prosthetic heart valve: Secondary | ICD-10-CM | POA: Insufficient documentation

## 2023-12-21 DIAGNOSIS — Z5181 Encounter for therapeutic drug level monitoring: Secondary | ICD-10-CM | POA: Diagnosis not present

## 2023-12-21 DIAGNOSIS — R0602 Shortness of breath: Secondary | ICD-10-CM | POA: Diagnosis not present

## 2023-12-21 LAB — POCT INR: INR: 8 — AB (ref 2.0–3.0)

## 2023-12-23 ENCOUNTER — Ambulatory Visit: Attending: Cardiovascular Disease | Admitting: *Deleted

## 2023-12-23 DIAGNOSIS — Z5181 Encounter for therapeutic drug level monitoring: Secondary | ICD-10-CM | POA: Diagnosis not present

## 2023-12-23 DIAGNOSIS — Z952 Presence of prosthetic heart valve: Secondary | ICD-10-CM | POA: Diagnosis not present

## 2023-12-23 LAB — POCT INR: INR: 3.2 — AB (ref 2.0–3.0)

## 2023-12-23 NOTE — Patient Instructions (Signed)
 Increased INR goal to 2.5 - 3.5 per Dr Janett Medin  Pt took Vit K 5mg  tablet that she had at home. Restart warfarin 1 tablet daily except 1 1/2 tablets on Tuesdays, Thursdays and Saturdays Recheck in 1 wk

## 2023-12-28 DIAGNOSIS — R634 Abnormal weight loss: Secondary | ICD-10-CM | POA: Diagnosis not present

## 2023-12-28 DIAGNOSIS — C3401 Malignant neoplasm of right main bronchus: Secondary | ICD-10-CM | POA: Diagnosis not present

## 2023-12-28 DIAGNOSIS — D471 Chronic myeloproliferative disease: Secondary | ICD-10-CM | POA: Diagnosis not present

## 2023-12-28 DIAGNOSIS — J9859 Other diseases of mediastinum, not elsewhere classified: Secondary | ICD-10-CM | POA: Diagnosis not present

## 2023-12-29 DIAGNOSIS — Z5112 Encounter for antineoplastic immunotherapy: Secondary | ICD-10-CM | POA: Diagnosis not present

## 2023-12-29 DIAGNOSIS — J9859 Other diseases of mediastinum, not elsewhere classified: Secondary | ICD-10-CM | POA: Diagnosis not present

## 2023-12-29 DIAGNOSIS — R634 Abnormal weight loss: Secondary | ICD-10-CM | POA: Diagnosis not present

## 2023-12-29 DIAGNOSIS — K219 Gastro-esophageal reflux disease without esophagitis: Secondary | ICD-10-CM | POA: Diagnosis not present

## 2023-12-29 DIAGNOSIS — C3401 Malignant neoplasm of right main bronchus: Secondary | ICD-10-CM | POA: Diagnosis not present

## 2023-12-29 DIAGNOSIS — Z681 Body mass index (BMI) 19 or less, adult: Secondary | ICD-10-CM | POA: Diagnosis not present

## 2023-12-30 ENCOUNTER — Ambulatory Visit: Attending: Cardiovascular Disease

## 2023-12-30 DIAGNOSIS — Z952 Presence of prosthetic heart valve: Secondary | ICD-10-CM | POA: Insufficient documentation

## 2023-12-30 DIAGNOSIS — Z5181 Encounter for therapeutic drug level monitoring: Secondary | ICD-10-CM | POA: Insufficient documentation

## 2023-12-30 LAB — POCT INR: INR: 3.1 — AB (ref 2.0–3.0)

## 2023-12-30 NOTE — Patient Instructions (Signed)
 Description   Increased INR goal to 2.5 - 3.5 per Dr Janett Medin  Continue on same dosage of Warfarin 1 tablet daily except 1 1/2 tablets on Tuesdays, Thursdays and Saturdays Recheck in 2 weeks.

## 2024-01-01 DIAGNOSIS — J9859 Other diseases of mediastinum, not elsewhere classified: Secondary | ICD-10-CM | POA: Diagnosis not present

## 2024-01-01 DIAGNOSIS — C3401 Malignant neoplasm of right main bronchus: Secondary | ICD-10-CM | POA: Diagnosis not present

## 2024-01-01 DIAGNOSIS — J90 Pleural effusion, not elsewhere classified: Secondary | ICD-10-CM | POA: Diagnosis not present

## 2024-01-01 DIAGNOSIS — C3411 Malignant neoplasm of upper lobe, right bronchus or lung: Secondary | ICD-10-CM | POA: Diagnosis not present

## 2024-01-01 DIAGNOSIS — I77810 Thoracic aortic ectasia: Secondary | ICD-10-CM | POA: Diagnosis not present

## 2024-01-11 NOTE — Progress Notes (Signed)
Please see anticoagulation encounter.

## 2024-01-13 ENCOUNTER — Ambulatory Visit: Attending: Cardiovascular Disease | Admitting: *Deleted

## 2024-01-13 DIAGNOSIS — Z5181 Encounter for therapeutic drug level monitoring: Secondary | ICD-10-CM | POA: Insufficient documentation

## 2024-01-13 DIAGNOSIS — Z952 Presence of prosthetic heart valve: Secondary | ICD-10-CM | POA: Insufficient documentation

## 2024-01-13 DIAGNOSIS — E861 Hypovolemia: Secondary | ICD-10-CM | POA: Insufficient documentation

## 2024-01-13 DIAGNOSIS — I10 Essential (primary) hypertension: Secondary | ICD-10-CM | POA: Insufficient documentation

## 2024-01-13 DIAGNOSIS — I952 Hypotension due to drugs: Secondary | ICD-10-CM | POA: Insufficient documentation

## 2024-01-13 LAB — POCT INR: INR: 6.8 — AB (ref 2.0–3.0)

## 2024-01-13 NOTE — Patient Instructions (Signed)
 Increased INR goal to 2.5 - 3.5 per Dr Rosemarie  Hold warfarin 3 days then resume 1 tablet daily except 1 1/2 tablets on Tuesdays, Thursdays and Saturdays Recheck in 1 week Pt denies s/sof bleeding. Bleeding and fall precautions discussed with pt and she verbalized understanding.

## 2024-01-14 ENCOUNTER — Encounter: Payer: Self-pay | Admitting: Internal Medicine

## 2024-01-14 ENCOUNTER — Ambulatory Visit: Admitting: Internal Medicine

## 2024-01-14 VITALS — BP 80/50 | HR 89 | Ht 63.0 in | Wt 92.0 lb

## 2024-01-14 DIAGNOSIS — I952 Hypotension due to drugs: Secondary | ICD-10-CM | POA: Diagnosis not present

## 2024-01-14 DIAGNOSIS — E861 Hypovolemia: Secondary | ICD-10-CM

## 2024-01-14 DIAGNOSIS — I10 Essential (primary) hypertension: Secondary | ICD-10-CM | POA: Diagnosis not present

## 2024-01-14 DIAGNOSIS — Z952 Presence of prosthetic heart valve: Secondary | ICD-10-CM | POA: Diagnosis not present

## 2024-01-14 DIAGNOSIS — Z5181 Encounter for therapeutic drug level monitoring: Secondary | ICD-10-CM | POA: Diagnosis not present

## 2024-01-14 NOTE — Patient Instructions (Addendum)
 Medication Instructions:   Stop Metoprolol  (Lopressor ) Continue all other medications.     Labwork:  none  Testing/Procedures:  none  Follow-Up:  Your physician wants you to follow up in:  1.5 years.  You should receive a recall letter in the mail about 2 months prior to the time you are due.  If you don't receive this, please call our office to schedule your follow up appointment.      Any Other Special Instructions Will Be Listed Below (If Applicable).   If you need a refill on your cardiac medications before your next appointment, please call your pharmacy.

## 2024-01-14 NOTE — Progress Notes (Signed)
 Cardiology Office Note  Date: 01/14/2024   ID: Judy Sawyer, DOB 1955-11-28, MRN [id number]  PCP:  Job Bolt, PA  Cardiologist:  Diannah SHAUNNA Maywood, MD Electrophysiologist:  None   Reason for Office Visit: Follow-up of aortic valve replacement and ascending aortic aneurysm   History of Present Illness: Judy Sawyer is a 68 y.o. 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  Echocardiogram from 2022 showed normal functioning of aortic valve prosthesis with mild aortic valve regurgitation.  Normal LVEF.  Repeat echocardiogram in 2024 showed mild AI as well.  Feeling tired and dizzy recently.  BP today is 80/50 mmHg.  Does not drink water at home.  Only drinks 1 glass of water with medications.  Drinks sodas and coffee.  No angina or DOE.  Last weight and appetite.  Follows with oncology at [facility] for the management of lung cancer.  Past Medical History:  Diagnosis Date   Acute bronchitis    Anemia, unspecified    Aortic stenosis    previous cardiologisit: Dr. Horace in [place], [place]    Cancer [facility])    Carotid stenosis 05/13   mild less than 50% bilaterally   Chest pain    Collagen vascular disease (HCC)    COPD (chronic obstructive pulmonary disease) (HCC)    Fibromyalgia    GERD (gastroesophageal reflux disease)    Hypertension    Murmur    Tobacco abuse     Past Surgical History:  Procedure Laterality Date   ABDOMINAL HYSTERECTOMY     AORTIC VALVE REPLACEMENT  11/2011   in [place] . St. Jude 19 mm   COLONOSCOPY     KNEE ARTHROSCOPY WITH LATERAL MENISECTOMY Left 08/16/2014   Procedure: LEFT KNEE ARTHROSCOPY WITH LATERAL MENISECTOMY;  Surgeon: Lamar DELENA Millman, MD;  Location: [facility];  Service: Orthopedics;  Laterality: Left;   KNEE ARTHROSCOPY WITH MEDIAL  MENISECTOMY Left 08/16/2014   Procedure: KNEE ARTHROSCOPY WITH MEDIAL MENISECTOMY;  Surgeon: Lamar DELENA Millman, MD;  Location: [facility];  Service: Orthopedics;  Laterality: Left;   TONSILLECTOMY     UPPER GI ENDOSCOPY      Current Outpatient Medications  Medication Sig Dispense Refill   atorvastatin  (LIPITOR) 10 MG tablet Take 1 tablet (10 mg total) by mouth daily. 30 tablet 5   Cholecalciferol (VITAMIN D3) 1000 units CAPS Take 1 capsule by mouth daily.     fluticasone (FLONASE) 50 MCG/ACT nasal spray Place into both nostrils as needed for allergies or rhinitis.     HYDROcodone-acetaminophen (NORCO) 7.5-325 MG tablet Take 1 tablet by mouth 3 (three) times daily.     levothyroxine (SYNTHROID) 200 MCG tablet Take 200 mcg by mouth daily.     Melatonin 5 MG TABS Take 1 tablet by mouth daily.     metoprolol  tartrate (LOPRESSOR ) 25 MG tablet Take 1/2 (one-half) tablet by mouth twice daily 90 tablet 2   NIVOLUMAB  IV Inject into the vein.     pantoprazole (PROTONIX) 40 MG tablet Take 40 mg by mouth daily.     warfarin (COUMADIN ) 2 MG tablet TAKE 1/2 TO 1 TABLET BY MOUTH ONCE DAILY OR  AS  DIRECTED  BY  THE  COUMADIN   CLINIC 35 tablet 5   No current facility-administered medications for this visit.   Allergies:  Codeine   Social History: The patient  reports that she quit smoking about 6 years ago. Her smoking use included cigarettes. She started smoking about 54 years ago. She has a 24 pack-year smoking history. She has never used smokeless tobacco. She reports that she does not drink alcohol  and does not use drugs.   Family History: The patient's family history includes Cancer in her mother; Diabetes type II in her sister; Emphysema in her father; Heart attack in her mother; Heart disease in her sister; Liver disease in her father; Rheumatic fever in her sister.   ROS:  Please see the history of present illness. Otherwise, complete review of systems is positive for none  All other  systems are reviewed and negative.   Physical Exam: VS:  BP (!) 80/50   Pulse 89   Ht 5' 3 (1.6 m)   Wt 92 lb (41.7 kg)   SpO2 98%   BMI 16.30 kg/m , BMI Body mass index is 16.3 kg/m.  Wt Readings from Last 3 Encounters:  01/14/24 92 lb (41.7 kg)  12/15/22 102 lb 6.4 oz (46.4 kg)  11/18/21 104 lb 6.4 oz (47.4 kg)    General: Patient appears comfortable at rest. HEENT: Conjunctiva and lids normal, oropharynx clear with moist mucosa. Neck: Supple, no elevated JVP or carotid bruits, no thyromegaly. Lungs: Clear to auscultation, nonlabored breathing at rest. Cardiac: Regular rate and rhythm, no S3 or significant systolic murmur, no pericardial rub. Abdomen: Soft, nontender, no hepatomegaly, bowel sounds present, no guarding or rebound. Extremities: No pitting edema, distal pulses 2+. Skin: Warm and dry. Musculoskeletal: No kyphosis. Neuropsychiatric: Alert and oriented x3, affect grossly appropriate.  Recent Labwork: 08/31/2023: ALT 15; AST 23; BUN 6; Creatinine, Ser 0.85; Hemoglobin 16.0; Platelets 505; Potassium 4.3; Sodium 143; TSH 1.150     Component Value Date/Time   CHOL 137 08/31/2023 0937   TRIG 168 (H) 08/31/2023 0937   HDL 33 (L) 08/31/2023 0937   CHOLHDL 4.2 08/31/2023 0937   LDLCALC 75 08/31/2023 0937     Assessment and Plan:  # Hypotension likely from dehydration and medications - Patient has been symptomatic with dizziness and fatigue.  BP 80/50 mmHg.  Drinks 1 glass of water daily, 2 sips with medications.  Drinks coffee, [facility] etc.  Strongly encouraged p.o. hydration, avoid sodas and limit caffeine intake.  Will go ahead and discontinue metoprolol .  # History of mechanical aortic valve replacement with a 19 mm Saint Jude mechanical valve in 2013 - Continue Coumadin  with goal INR between 2 and 3 - Has dentures, follow-up with dentist - SBE prophylaxis prior to dental procedures - Echocardiogram from 2022 and 2024 showed stable AI  # Ascending  aortic aneurysm -43 mm in 2019, 43 mm in 2022, 41 mm in 2024.  Stable.  Will continue to monitor. .    Medication Adjustments/Labs and Tests Ordered: Current medicines are reviewed at length with the patient today.  Concerns regarding medicines are outlined above.   Tests Ordered: Orders Placed This Encounter  Procedures   EKG 12-Lead    Medication Changes: No orders of the defined types were placed in this encounter.   Disposition:  Follow up 1.5 years  Signed Hydee Fleece Priya Carrisa Keller, MD, 01/14/2024 10:45 AM    [facility] [address] [place], [place], [place] [address]

## 2024-01-18 ENCOUNTER — Ambulatory Visit: Attending: Cardiovascular Disease | Admitting: *Deleted

## 2024-01-18 DIAGNOSIS — Z952 Presence of prosthetic heart valve: Secondary | ICD-10-CM | POA: Diagnosis not present

## 2024-01-18 DIAGNOSIS — Z5181 Encounter for therapeutic drug level monitoring: Secondary | ICD-10-CM | POA: Diagnosis not present

## 2024-01-18 LAB — POCT INR: INR: 1.4 — AB (ref 2.0–3.0)

## 2024-01-18 NOTE — Progress Notes (Signed)
Please see anticoagulation encounter.

## 2024-01-18 NOTE — Patient Instructions (Signed)
 Increased INR goal to 2.5 - 3.5 per Dr Rosemarie  Take warfarin 2 tablets tonight and tomorrow night then resume 1 tablet daily except 1 1/2 tablets on Tuesdays, Thursdays and Saturdays Recheck in 1 week

## 2024-01-25 ENCOUNTER — Ambulatory Visit

## 2024-01-25 DIAGNOSIS — J9859 Other diseases of mediastinum, not elsewhere classified: Secondary | ICD-10-CM | POA: Diagnosis not present

## 2024-01-25 DIAGNOSIS — R634 Abnormal weight loss: Secondary | ICD-10-CM | POA: Diagnosis not present

## 2024-01-25 DIAGNOSIS — C3401 Malignant neoplasm of right main bronchus: Secondary | ICD-10-CM | POA: Diagnosis not present

## 2024-01-26 ENCOUNTER — Ambulatory Visit: Attending: Cardiovascular Disease | Admitting: *Deleted

## 2024-01-26 DIAGNOSIS — J9859 Other diseases of mediastinum, not elsewhere classified: Secondary | ICD-10-CM | POA: Diagnosis not present

## 2024-01-26 DIAGNOSIS — Z952 Presence of prosthetic heart valve: Secondary | ICD-10-CM | POA: Diagnosis not present

## 2024-01-26 DIAGNOSIS — Z5181 Encounter for therapeutic drug level monitoring: Secondary | ICD-10-CM | POA: Diagnosis not present

## 2024-01-26 DIAGNOSIS — C3401 Malignant neoplasm of right main bronchus: Secondary | ICD-10-CM | POA: Diagnosis not present

## 2024-01-26 DIAGNOSIS — Z5112 Encounter for antineoplastic immunotherapy: Secondary | ICD-10-CM | POA: Diagnosis not present

## 2024-01-26 DIAGNOSIS — R634 Abnormal weight loss: Secondary | ICD-10-CM | POA: Diagnosis not present

## 2024-01-26 LAB — POCT INR: INR: 2.1 (ref 2.0–3.0)

## 2024-01-26 NOTE — Patient Instructions (Signed)
 Increased INR goal to 2.5 - 3.5 per Dr Rosemarie  Take warfarin 2 1/2 tablets tonight then resume 1 tablet daily except 1 1/2 tablets on Tuesdays, Thursdays and Saturdays Recheck in 2 weeks

## 2024-01-26 NOTE — Progress Notes (Signed)
Please see anticoagulation encounter.

## 2024-01-28 ENCOUNTER — Other Ambulatory Visit: Payer: Self-pay | Admitting: Internal Medicine

## 2024-01-28 MED ORDER — WARFARIN SODIUM 2 MG PO TABS: ORAL_TABLET | ORAL | 0 refills | Status: DC

## 2024-01-28 NOTE — Telephone Encounter (Signed)
 Prescription refill request received for warfarin Lov:  Judy Sawyer, 01/14/2024 Next INR check: 7/28 Warfarin tablet strength:  2mg 

## 2024-01-28 NOTE — Addendum Note (Signed)
 Addended by: ROYDEN RAKE B on: 01/28/2024 10:40 AM   Modules accepted: Orders

## 2024-02-03 DIAGNOSIS — Z79890 Hormone replacement therapy (postmenopausal): Secondary | ICD-10-CM | POA: Diagnosis not present

## 2024-02-03 DIAGNOSIS — K219 Gastro-esophageal reflux disease without esophagitis: Secondary | ICD-10-CM | POA: Diagnosis not present

## 2024-02-03 DIAGNOSIS — I2510 Atherosclerotic heart disease of native coronary artery without angina pectoris: Secondary | ICD-10-CM | POA: Diagnosis not present

## 2024-02-03 DIAGNOSIS — J439 Emphysema, unspecified: Secondary | ICD-10-CM | POA: Diagnosis not present

## 2024-02-03 DIAGNOSIS — Z8673 Personal history of transient ischemic attack (TIA), and cerebral infarction without residual deficits: Secondary | ICD-10-CM | POA: Diagnosis not present

## 2024-02-03 DIAGNOSIS — Z90710 Acquired absence of both cervix and uterus: Secondary | ICD-10-CM | POA: Diagnosis not present

## 2024-02-03 DIAGNOSIS — R42 Dizziness and giddiness: Secondary | ICD-10-CM | POA: Diagnosis not present

## 2024-02-03 DIAGNOSIS — Z87891 Personal history of nicotine dependence: Secondary | ICD-10-CM | POA: Diagnosis not present

## 2024-02-03 DIAGNOSIS — Z79899 Other long term (current) drug therapy: Secondary | ICD-10-CM | POA: Diagnosis not present

## 2024-02-03 DIAGNOSIS — Z7901 Long term (current) use of anticoagulants: Secondary | ICD-10-CM | POA: Diagnosis not present

## 2024-02-03 DIAGNOSIS — I10 Essential (primary) hypertension: Secondary | ICD-10-CM | POA: Diagnosis not present

## 2024-02-04 DIAGNOSIS — R42 Dizziness and giddiness: Secondary | ICD-10-CM | POA: Diagnosis not present

## 2024-02-04 DIAGNOSIS — Z681 Body mass index (BMI) 19 or less, adult: Secondary | ICD-10-CM | POA: Diagnosis not present

## 2024-02-04 DIAGNOSIS — R519 Headache, unspecified: Secondary | ICD-10-CM | POA: Diagnosis not present

## 2024-02-04 DIAGNOSIS — R300 Dysuria: Secondary | ICD-10-CM | POA: Diagnosis not present

## 2024-02-08 ENCOUNTER — Encounter

## 2024-02-09 ENCOUNTER — Encounter

## 2024-02-10 ENCOUNTER — Ambulatory Visit: Attending: Cardiovascular Disease | Admitting: *Deleted

## 2024-02-10 DIAGNOSIS — Z5181 Encounter for therapeutic drug level monitoring: Secondary | ICD-10-CM | POA: Insufficient documentation

## 2024-02-10 DIAGNOSIS — Z952 Presence of prosthetic heart valve: Secondary | ICD-10-CM | POA: Insufficient documentation

## 2024-02-10 LAB — POCT INR: INR: 3.6 — AB (ref 2.0–3.0)

## 2024-02-10 NOTE — Patient Instructions (Signed)
 Increased INR goal to 2.5 - 3.5 per Dr Rosemarie  Continue warfarin 1 tablet daily except 1 1/2 tablets on Tuesdays, Thursdays and Saturdays Recheck in 1 week

## 2024-02-10 NOTE — Progress Notes (Signed)
 INR 3.6  Please see anticoagulation encounter

## 2024-02-17 ENCOUNTER — Telehealth: Payer: Self-pay | Admitting: Internal Medicine

## 2024-02-17 ENCOUNTER — Ambulatory Visit: Attending: Cardiovascular Disease | Admitting: *Deleted

## 2024-02-17 DIAGNOSIS — Z5181 Encounter for therapeutic drug level monitoring: Secondary | ICD-10-CM | POA: Diagnosis not present

## 2024-02-17 DIAGNOSIS — Z952 Presence of prosthetic heart valve: Secondary | ICD-10-CM | POA: Insufficient documentation

## 2024-02-17 LAB — POCT INR: INR: 8 — AB (ref 2.0–3.0)

## 2024-02-17 LAB — PROTIME-INR: INR: 10.85 — AB (ref 0.80–1.20)

## 2024-02-17 NOTE — Telephone Encounter (Signed)
 Judy Sawyer with [facility] lab is calling to report abnormal lab results.

## 2024-02-17 NOTE — Patient Instructions (Signed)
 Increased INR goal to 2.5 - 3.5 per Dr Rosemarie  POC INR >8.0   Sent to lab at [facility] for STAT PT/INR 10.85 Pt to hold warfarin and come for INR check on Monday Denies s/s of bleeding or excessive bruising.  Bleeding and fall precautions discussed with pt/spouse and they verbalized understanding.

## 2024-02-17 NOTE — Telephone Encounter (Signed)
 INR 10.85 per [facility] lab   I will forward to Gwynn reid,RN

## 2024-02-17 NOTE — Progress Notes (Signed)
 INR 10.85; Please see anticoagulation encounter

## 2024-02-17 NOTE — Telephone Encounter (Signed)
 Results noted.  See coumadin  note.  Spoke with patient and coumadin  instructions given.

## 2024-02-22 ENCOUNTER — Ambulatory Visit: Attending: Cardiovascular Disease | Admitting: *Deleted

## 2024-02-22 DIAGNOSIS — J9859 Other diseases of mediastinum, not elsewhere classified: Secondary | ICD-10-CM | POA: Diagnosis not present

## 2024-02-22 DIAGNOSIS — Z952 Presence of prosthetic heart valve: Secondary | ICD-10-CM | POA: Insufficient documentation

## 2024-02-22 DIAGNOSIS — Z5181 Encounter for therapeutic drug level monitoring: Secondary | ICD-10-CM | POA: Insufficient documentation

## 2024-02-22 DIAGNOSIS — C3401 Malignant neoplasm of right main bronchus: Secondary | ICD-10-CM | POA: Diagnosis not present

## 2024-02-22 DIAGNOSIS — R634 Abnormal weight loss: Secondary | ICD-10-CM | POA: Diagnosis not present

## 2024-02-22 LAB — POCT INR: INR: 1.2 — AB (ref 2.0–3.0)

## 2024-02-22 NOTE — Patient Instructions (Signed)
 Take warfarin 2 tablets tonight and tomorrow night then restart 1 tablet daily except 1 1/2 tablets on Tuesdays, Thursdays and Saturdays Recheck in 1 wk

## 2024-02-22 NOTE — Progress Notes (Signed)
 INR 1.2; Please see anticoagulation encounter

## 2024-02-23 DIAGNOSIS — Z5112 Encounter for antineoplastic immunotherapy: Secondary | ICD-10-CM | POA: Diagnosis not present

## 2024-02-23 DIAGNOSIS — R634 Abnormal weight loss: Secondary | ICD-10-CM | POA: Diagnosis not present

## 2024-02-23 DIAGNOSIS — J9859 Other diseases of mediastinum, not elsewhere classified: Secondary | ICD-10-CM | POA: Diagnosis not present

## 2024-02-23 DIAGNOSIS — C3401 Malignant neoplasm of right main bronchus: Secondary | ICD-10-CM | POA: Diagnosis not present

## 2024-02-25 ENCOUNTER — Telehealth: Payer: Self-pay

## 2024-02-25 NOTE — Telephone Encounter (Signed)
   Pre-operative Risk Assessment    Patient Name: Judy Sawyer  DOB: Mar 08, 1956 MRN: [id number]   Date of last office visit: 01/14/24 Judy MAYWOOD, MD Date of next office visit: NONE   Request for Surgical Clearance    Procedure:  EGD  Date of Surgery:  Clearance TBD                                Surgeon:  NOT INDICATED Surgeon's Group or Practice Name:  [facility] Phone number:  [phone] Fax number:  [phone]   Type of Clearance Requested:   - Medical  - Pharmacy:  Hold Warfarin (Coumadin ) 5 DAYS   Type of Anesthesia:  Not Indicated   Additional requests/questions:    Signed, Judy Sawyer   02/25/2024, 5:00 PM

## 2024-02-26 NOTE — Telephone Encounter (Signed)
 Pharmacy please advise on holding coumadin  prior to EDG scheduled for TBD, Last lab 02/22/2024 both CBC and BMET. Thank you.

## 2024-02-29 ENCOUNTER — Ambulatory Visit: Payer: Medicare Other | Admitting: Neurology

## 2024-02-29 ENCOUNTER — Ambulatory Visit: Attending: Cardiovascular Disease | Admitting: *Deleted

## 2024-02-29 DIAGNOSIS — N3000 Acute cystitis without hematuria: Secondary | ICD-10-CM | POA: Diagnosis not present

## 2024-02-29 DIAGNOSIS — Z952 Presence of prosthetic heart valve: Secondary | ICD-10-CM | POA: Diagnosis not present

## 2024-02-29 DIAGNOSIS — Z681 Body mass index (BMI) 19 or less, adult: Secondary | ICD-10-CM | POA: Diagnosis not present

## 2024-02-29 DIAGNOSIS — R509 Fever, unspecified: Secondary | ICD-10-CM | POA: Diagnosis not present

## 2024-02-29 DIAGNOSIS — Z5181 Encounter for therapeutic drug level monitoring: Secondary | ICD-10-CM | POA: Insufficient documentation

## 2024-02-29 DIAGNOSIS — R300 Dysuria: Secondary | ICD-10-CM | POA: Diagnosis not present

## 2024-02-29 LAB — POCT INR: INR: 1.7 — AB (ref 2.0–3.0)

## 2024-02-29 NOTE — Progress Notes (Signed)
 INR 1.7; Please see anticoagulation encounter

## 2024-02-29 NOTE — Patient Instructions (Signed)
 Take warfarin 2 tablets tonight and tomorrow night then resume 1 tablet daily except 1 1/2 tablets on Tuesdays, Thursdays and Saturdays Recheck in 1 wk

## 2024-03-04 NOTE — Telephone Encounter (Signed)
 Dr. Mallipeddi,  You saw this patient on 01/14/2024. Per protocol we request that you comment on his cardiac risk to proceed with EGD on TBD, since it has been less than 2 months since evaluated in the office. Please send your comment to P CV Pre-Op Pool. Pharmacy has weighed in on holding coumadin  and informed RN in [place] office.  Thank you, Lamarr Satterfield DNP, ANP, AACC.

## 2024-03-04 NOTE — Telephone Encounter (Signed)
 Patient with diagnosis of mechanical AVR on warfarin for anticoagulation.    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 Goal of INR increased to 2.5-3.5.   CrCl 38 Platelet count 319  Per office protocol, patient can hold warfarin for 5 days prior to procedure.   Patient WILL need bridging with Lovenox  (enoxaparin ) around procedure.  Followed by Coumadin  clinic in [place], message sent to RN  **This guidance is not considered finalized until pre-operative APP has relayed final recommendations.**

## 2024-03-09 ENCOUNTER — Ambulatory Visit: Attending: Cardiovascular Disease | Admitting: *Deleted

## 2024-03-09 DIAGNOSIS — Z5181 Encounter for therapeutic drug level monitoring: Secondary | ICD-10-CM | POA: Insufficient documentation

## 2024-03-09 DIAGNOSIS — Z952 Presence of prosthetic heart valve: Secondary | ICD-10-CM | POA: Insufficient documentation

## 2024-03-09 LAB — POCT INR: INR: 3.2 — AB (ref 2.0–3.0)

## 2024-03-09 NOTE — Patient Instructions (Signed)
 Continue warfarin 1 tablet daily except 1 1/2 tablets on Tuesdays, Thursdays and Saturdays Recheck in 2 wk EGD Date TBD  Will need Lovenox  bridge

## 2024-03-16 NOTE — Telephone Encounter (Signed)
 Dr. Mallipeddi,   I am following up on previous preop request for EGD. You just saw this patient 01/14/24. Per protocol we request that you comment on his cardiac risk to proceed with EGD on TBD. Please send your comment to P CV Pre-Op Pool. Pharmacy has weighed in on holding coumadin  and informed RN in [place] office.

## 2024-03-18 NOTE — Telephone Encounter (Signed)
   Patient Name: Judy Sawyer  DOB: 1955-08-19 MRN: [id number]  Primary Cardiologist: Vishnu P Mallipeddi, MD  Chart reviewed as part of pre-operative protocol coverage. Given past medical history and time since last visit, based on ACC/AHA guidelines, Judy Sawyer is at acceptable risk for the planned procedure without further cardiovascular testing.   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 Goal of INR increased to 2.5-3.5.    CrCl 38 Platelet count 319   Per office protocol, patient can hold warfarin for 5 days prior to procedure.   Patient WILL need bridging with Lovenox  (enoxaparin ) around procedure.    Per Dr. Mallipeddi (03/18/2024) Low risk. Agree with PharmD.   Planned procedure is EGD.   If only EGD: Hold Warfarin for 3 days prior to EGD and resume after EGD. No need of bridging AC.   If EGD + biopsies: Need to hold Warfarin for 5 days prior to EGD and start bridging Lovenox  2 days prior to EGD. After EGD, resume Lovenox  and Coumadin  until goal INR is reached, then stop Lovenox . Please contact coumadin  nurse in [place] office [phone]) if you plan to do biopsies.   The patient was advised that if she develops new symptoms prior to surgery to contact our office to arrange for a follow-up visit, and she verbalized understanding.  I will route this recommendation to the requesting party via Epic fax function and remove from pre-op pool.  Please call with questions.  Lamarr Satterfield, NP 03/18/2024, 8:14 AM

## 2024-03-21 DIAGNOSIS — Z1589 Genetic susceptibility to other disease: Secondary | ICD-10-CM | POA: Diagnosis not present

## 2024-03-21 DIAGNOSIS — C7880 Secondary malignant neoplasm of unspecified digestive organ: Secondary | ICD-10-CM | POA: Diagnosis not present

## 2024-03-21 DIAGNOSIS — R634 Abnormal weight loss: Secondary | ICD-10-CM | POA: Diagnosis not present

## 2024-03-21 DIAGNOSIS — Z952 Presence of prosthetic heart valve: Secondary | ICD-10-CM | POA: Diagnosis not present

## 2024-03-21 DIAGNOSIS — D751 Secondary polycythemia: Secondary | ICD-10-CM | POA: Diagnosis not present

## 2024-03-21 DIAGNOSIS — C3401 Malignant neoplasm of right main bronchus: Secondary | ICD-10-CM | POA: Diagnosis not present

## 2024-03-21 DIAGNOSIS — Z7901 Long term (current) use of anticoagulants: Secondary | ICD-10-CM | POA: Diagnosis not present

## 2024-03-21 DIAGNOSIS — J9859 Other diseases of mediastinum, not elsewhere classified: Secondary | ICD-10-CM | POA: Diagnosis not present

## 2024-03-21 DIAGNOSIS — Z5112 Encounter for antineoplastic immunotherapy: Secondary | ICD-10-CM | POA: Diagnosis not present

## 2024-03-21 DIAGNOSIS — E039 Hypothyroidism, unspecified: Secondary | ICD-10-CM | POA: Diagnosis not present

## 2024-03-22 DIAGNOSIS — R634 Abnormal weight loss: Secondary | ICD-10-CM | POA: Diagnosis not present

## 2024-03-22 DIAGNOSIS — J9859 Other diseases of mediastinum, not elsewhere classified: Secondary | ICD-10-CM | POA: Diagnosis not present

## 2024-03-22 DIAGNOSIS — C3401 Malignant neoplasm of right main bronchus: Secondary | ICD-10-CM | POA: Diagnosis not present

## 2024-03-22 DIAGNOSIS — Z5112 Encounter for antineoplastic immunotherapy: Secondary | ICD-10-CM | POA: Diagnosis not present

## 2024-03-22 NOTE — Progress Notes (Signed)
 INR 3.2 Please see anticoagulation encounter

## 2024-03-24 DIAGNOSIS — E538 Deficiency of other specified B group vitamins: Secondary | ICD-10-CM | POA: Diagnosis not present

## 2024-03-24 DIAGNOSIS — Z23 Encounter for immunization: Secondary | ICD-10-CM | POA: Diagnosis not present

## 2024-03-28 ENCOUNTER — Ambulatory Visit

## 2024-04-01 DIAGNOSIS — R42 Dizziness and giddiness: Secondary | ICD-10-CM | POA: Diagnosis not present

## 2024-04-01 DIAGNOSIS — J449 Chronic obstructive pulmonary disease, unspecified: Secondary | ICD-10-CM | POA: Diagnosis not present

## 2024-04-01 DIAGNOSIS — C3491 Malignant neoplasm of unspecified part of right bronchus or lung: Secondary | ICD-10-CM | POA: Diagnosis not present

## 2024-04-01 DIAGNOSIS — E538 Deficiency of other specified B group vitamins: Secondary | ICD-10-CM | POA: Diagnosis not present

## 2024-04-01 DIAGNOSIS — Z681 Body mass index (BMI) 19 or less, adult: Secondary | ICD-10-CM | POA: Diagnosis not present

## 2024-04-04 ENCOUNTER — Ambulatory Visit: Attending: Cardiovascular Disease | Admitting: *Deleted

## 2024-04-04 DIAGNOSIS — Z952 Presence of prosthetic heart valve: Secondary | ICD-10-CM | POA: Insufficient documentation

## 2024-04-04 DIAGNOSIS — Z5181 Encounter for therapeutic drug level monitoring: Secondary | ICD-10-CM | POA: Diagnosis not present

## 2024-04-04 LAB — POCT INR: INR: 5.6 — AB (ref 2.0–3.0)

## 2024-04-04 NOTE — Patient Instructions (Signed)
 Hold warfarin tonight and tomorrow night then resume 1 tablet daily except 1 1/2 tablets on Tuesdays, Thursdays and Saturdays Recheck in 1 wk EGD Date TBD  Will need Lovenox  bridge

## 2024-04-04 NOTE — Progress Notes (Signed)
 INR 5.6. Please see anticoagulation encounter

## 2024-04-11 ENCOUNTER — Ambulatory Visit: Attending: Cardiovascular Disease | Admitting: *Deleted

## 2024-04-11 DIAGNOSIS — Z5181 Encounter for therapeutic drug level monitoring: Secondary | ICD-10-CM | POA: Insufficient documentation

## 2024-04-11 DIAGNOSIS — Z952 Presence of prosthetic heart valve: Secondary | ICD-10-CM | POA: Diagnosis not present

## 2024-04-11 LAB — POCT INR: INR: 1.4 — AB (ref 2.0–3.0)

## 2024-04-11 NOTE — Patient Instructions (Signed)
 Take warfarin 2 tablets tonight and tomorrow night then resume 1 tablet daily except 1 1/2 tablets on Tuesdays, Thursdays and Saturdays Recheck in 1 wk EGD Date TBD  Will need Lovenox  bridge

## 2024-04-11 NOTE — Progress Notes (Signed)
 INR 1.4. Please see anticoagulation encounter

## 2024-04-15 NOTE — Telephone Encounter (Signed)
 I will re-fax the notes as the error was on the CMA side who entered the clearance in. The ph and fax# were reversed.    I apologize on behalf of the [facility] and will re-fax the notes to the correct fax# [phone],

## 2024-04-15 NOTE — Telephone Encounter (Signed)
 Pt is calling to let office know to f/u with [facility] at [phone] due to them stating they haven't received the clearance back. Please advise

## 2024-04-18 DIAGNOSIS — J9859 Other diseases of mediastinum, not elsewhere classified: Secondary | ICD-10-CM | POA: Diagnosis not present

## 2024-04-18 DIAGNOSIS — C3401 Malignant neoplasm of right main bronchus: Secondary | ICD-10-CM | POA: Diagnosis not present

## 2024-04-18 DIAGNOSIS — R634 Abnormal weight loss: Secondary | ICD-10-CM | POA: Diagnosis not present

## 2024-04-19 ENCOUNTER — Ambulatory Visit

## 2024-04-19 ENCOUNTER — Telehealth: Payer: Self-pay | Admitting: Internal Medicine

## 2024-04-19 DIAGNOSIS — C3401 Malignant neoplasm of right main bronchus: Secondary | ICD-10-CM | POA: Diagnosis not present

## 2024-04-19 DIAGNOSIS — J9859 Other diseases of mediastinum, not elsewhere classified: Secondary | ICD-10-CM | POA: Diagnosis not present

## 2024-04-19 DIAGNOSIS — Z5112 Encounter for antineoplastic immunotherapy: Secondary | ICD-10-CM | POA: Diagnosis not present

## 2024-04-19 DIAGNOSIS — Z95828 Presence of other vascular implants and grafts: Secondary | ICD-10-CM | POA: Diagnosis not present

## 2024-04-19 DIAGNOSIS — R634 Abnormal weight loss: Secondary | ICD-10-CM | POA: Diagnosis not present

## 2024-04-19 NOTE — Telephone Encounter (Signed)
 See clearance note from Pharmacy and Mallipeddi's instructions.  Normally they do an EGD and dilatation.  Should I continue to bridge as normal or hold for 3 days before with no bridge?

## 2024-04-19 NOTE — Telephone Encounter (Signed)
 Since it is not clear whether there will be biopsies performed, recommend holding 5 days and bridging as normal. Care everywhere has appt listed as endoscopy plus biopsies

## 2024-04-19 NOTE — Telephone Encounter (Signed)
 Received the Warfarin order from Mallipeddi. If they are to perform a dilation, would they follow the same hold as a biopsy, or would the orders then change. Please advise.

## 2024-04-19 NOTE — Telephone Encounter (Signed)
 Called pt.  Told her to take last dose of warfarin tonight and come tomorrow at 2:30pm to get bridging instructions.  She verbalized understanding. Was unable to talk with caller from [facility].  Phone number is wrong.

## 2024-04-20 ENCOUNTER — Ambulatory Visit: Attending: Cardiovascular Disease | Admitting: *Deleted

## 2024-04-20 DIAGNOSIS — Z5181 Encounter for therapeutic drug level monitoring: Secondary | ICD-10-CM | POA: Diagnosis not present

## 2024-04-20 DIAGNOSIS — Z952 Presence of prosthetic heart valve: Secondary | ICD-10-CM | POA: Diagnosis not present

## 2024-04-20 LAB — POCT INR: INR: 3.3 — AB (ref 2.0–3.0)

## 2024-04-20 NOTE — Progress Notes (Signed)
 INR 3.3; Please see anticoagulation encounter

## 2024-04-20 NOTE — Patient Instructions (Addendum)
 04/25/24  EGD/Poss. Dilatation at [facility]  04/18/24  Labs:  Hgb 15.2  Hct 46.6  Plts 403  Scr 1.12  CrCl 32.13   Wt 96lb  Lovenox  40mg  sq twice daily  (Pt has 19 syringes at home)  10/7  Last dose of warfarin 10/8  No lovenox  or warfarin 10/9 - 10/11  Lovenox  40mg  at 7am and 7pm 10/12  Lovenox  40mg  at 7am -----No Lovenox  in pm 10/13  No Lovenox  in am ----------procedure -----------Warfarin 3mg  pm 10/14  Lovenox  40mg  7am and 7pm and warfarin 4mg  in pm 10/15  Lovenox  40mg  7am and 7pm and warfarin 2mg  in pm 10/16  Lovenox  40mg  7am and 7pm and warfarin 3mg  in pm 10/17  Lovenox  40mg  7am and 7pm and warfarin 2mg  in pm 10/18  Lovenox  40mg  7am and 7pm and warfarin 3mg  in pm 10/19  Lovenox  40mg  7am and 7pm and warfarin 2mg  in pm 10/20  INR appt at 9:30am

## 2024-04-25 DIAGNOSIS — K219 Gastro-esophageal reflux disease without esophagitis: Secondary | ICD-10-CM | POA: Diagnosis not present

## 2024-04-25 DIAGNOSIS — Z87891 Personal history of nicotine dependence: Secondary | ICD-10-CM | POA: Diagnosis not present

## 2024-04-25 DIAGNOSIS — I252 Old myocardial infarction: Secondary | ICD-10-CM | POA: Diagnosis not present

## 2024-04-25 DIAGNOSIS — K222 Esophageal obstruction: Secondary | ICD-10-CM | POA: Diagnosis not present

## 2024-04-25 DIAGNOSIS — K449 Diaphragmatic hernia without obstruction or gangrene: Secondary | ICD-10-CM | POA: Diagnosis not present

## 2024-04-25 DIAGNOSIS — Z952 Presence of prosthetic heart valve: Secondary | ICD-10-CM | POA: Diagnosis not present

## 2024-04-25 DIAGNOSIS — I10 Essential (primary) hypertension: Secondary | ICD-10-CM | POA: Diagnosis not present

## 2024-04-25 DIAGNOSIS — J439 Emphysema, unspecified: Secondary | ICD-10-CM | POA: Diagnosis not present

## 2024-04-25 DIAGNOSIS — I2510 Atherosclerotic heart disease of native coronary artery without angina pectoris: Secondary | ICD-10-CM | POA: Diagnosis not present

## 2024-04-26 ENCOUNTER — Ambulatory Visit: Admitting: Neurology

## 2024-04-26 ENCOUNTER — Encounter: Payer: Self-pay | Admitting: Neurology

## 2024-05-02 ENCOUNTER — Ambulatory Visit: Attending: Cardiovascular Disease | Admitting: *Deleted

## 2024-05-02 DIAGNOSIS — Z5181 Encounter for therapeutic drug level monitoring: Secondary | ICD-10-CM

## 2024-05-02 DIAGNOSIS — Z952 Presence of prosthetic heart valve: Secondary | ICD-10-CM | POA: Diagnosis not present

## 2024-05-02 LAB — POCT INR: INR: 1.5 — AB (ref 2.0–3.0)

## 2024-05-02 NOTE — Progress Notes (Signed)
 INR-1.5; Please see anticoagulation encounter

## 2024-05-02 NOTE — Patient Instructions (Signed)
 S/P EGD on 04/25/24/   Take warfarin 2 tablets tonight and tomorrow night  Continue Lovenox  Recheck on Wednesday

## 2024-05-03 DIAGNOSIS — R42 Dizziness and giddiness: Secondary | ICD-10-CM | POA: Diagnosis not present

## 2024-05-03 DIAGNOSIS — R2689 Other abnormalities of gait and mobility: Secondary | ICD-10-CM | POA: Diagnosis not present

## 2024-05-04 ENCOUNTER — Ambulatory Visit: Attending: Cardiovascular Disease | Admitting: *Deleted

## 2024-05-04 DIAGNOSIS — Z5181 Encounter for therapeutic drug level monitoring: Secondary | ICD-10-CM | POA: Diagnosis not present

## 2024-05-04 DIAGNOSIS — Z952 Presence of prosthetic heart valve: Secondary | ICD-10-CM | POA: Diagnosis not present

## 2024-05-04 LAB — POCT INR: INR: 4.6 — AB (ref 2.0–3.0)

## 2024-05-04 MED ORDER — WARFARIN SODIUM 2 MG PO TABS: ORAL_TABLET | ORAL | 3 refills | Status: DC

## 2024-05-04 NOTE — Patient Instructions (Signed)
 S/P EGD on 04/25/24/   Hold warfarin tonight then resume 1 tablet daily except 1 1/2 tablets on Tuesdays, Thursdays and Saturdays  Stop Lovenox  Recheck 1 wk

## 2024-05-04 NOTE — Progress Notes (Signed)
 INR 4.6 Please see anticoagulation encounter

## 2024-05-11 ENCOUNTER — Ambulatory Visit: Attending: Cardiovascular Disease | Admitting: *Deleted

## 2024-05-11 DIAGNOSIS — Z952 Presence of prosthetic heart valve: Secondary | ICD-10-CM | POA: Diagnosis not present

## 2024-05-11 DIAGNOSIS — Z5181 Encounter for therapeutic drug level monitoring: Secondary | ICD-10-CM

## 2024-05-11 LAB — POCT INR: INR: 1.6 — AB (ref 2.0–3.0)

## 2024-05-11 NOTE — Patient Instructions (Signed)
 S/P EGD on 04/25/24 Take warfarin 2 tablets tonight and tomorrow night then resume 1 tablet daily except 1 1/2 tablets on Tuesdays, Thursdays and Saturdays  Recheck 1 wk

## 2024-05-11 NOTE — Progress Notes (Signed)
 INR 1.6; Please see anticoagulation encounter

## 2024-05-16 DIAGNOSIS — Z5112 Encounter for antineoplastic immunotherapy: Secondary | ICD-10-CM | POA: Diagnosis not present

## 2024-05-16 DIAGNOSIS — Z952 Presence of prosthetic heart valve: Secondary | ICD-10-CM | POA: Diagnosis not present

## 2024-05-16 DIAGNOSIS — R634 Abnormal weight loss: Secondary | ICD-10-CM | POA: Diagnosis not present

## 2024-05-16 DIAGNOSIS — J9859 Other diseases of mediastinum, not elsewhere classified: Secondary | ICD-10-CM | POA: Diagnosis not present

## 2024-05-16 DIAGNOSIS — D471 Chronic myeloproliferative disease: Secondary | ICD-10-CM | POA: Diagnosis not present

## 2024-05-16 DIAGNOSIS — D751 Secondary polycythemia: Secondary | ICD-10-CM | POA: Diagnosis not present

## 2024-05-16 DIAGNOSIS — Z7901 Long term (current) use of anticoagulants: Secondary | ICD-10-CM | POA: Diagnosis not present

## 2024-05-16 DIAGNOSIS — E039 Hypothyroidism, unspecified: Secondary | ICD-10-CM | POA: Diagnosis not present

## 2024-05-16 DIAGNOSIS — C3401 Malignant neoplasm of right main bronchus: Secondary | ICD-10-CM | POA: Diagnosis not present

## 2024-05-16 DIAGNOSIS — C7880 Secondary malignant neoplasm of unspecified digestive organ: Secondary | ICD-10-CM | POA: Diagnosis not present

## 2024-05-17 DIAGNOSIS — J9859 Other diseases of mediastinum, not elsewhere classified: Secondary | ICD-10-CM | POA: Diagnosis not present

## 2024-05-17 DIAGNOSIS — M810 Age-related osteoporosis without current pathological fracture: Secondary | ICD-10-CM | POA: Diagnosis not present

## 2024-05-17 DIAGNOSIS — R634 Abnormal weight loss: Secondary | ICD-10-CM | POA: Diagnosis not present

## 2024-05-17 DIAGNOSIS — C3401 Malignant neoplasm of right main bronchus: Secondary | ICD-10-CM | POA: Diagnosis not present

## 2024-05-18 ENCOUNTER — Ambulatory Visit

## 2024-05-23 ENCOUNTER — Ambulatory Visit: Attending: Cardiovascular Disease

## 2024-06-13 ENCOUNTER — Other Ambulatory Visit: Payer: Self-pay | Admitting: Internal Medicine

## 2024-06-16 ENCOUNTER — Telehealth: Payer: Self-pay | Admitting: Internal Medicine

## 2024-06-17 MED ORDER — ATORVASTATIN CALCIUM 10 MG PO TABS: 10.0000 mg | ORAL_TABLET | Freq: Every day | ORAL | 2 refills | Status: AC

## 2024-06-17 MED ORDER — PANTOPRAZOLE SODIUM 40 MG PO TBEC: 40.0000 mg | DELAYED_RELEASE_TABLET | Freq: Every day | ORAL | 2 refills | Status: AC

## 2024-06-17 NOTE — Telephone Encounter (Signed)
 Warfarin 2mg  Dx-S/P AVR (aortic valve replacement Last INR Check-05/11/24 and was due 05/18/24-NEEDS AN APPT Last OV 01/14/24  Last refill 05/04/24 45 tabs with 3 refills  Called patient and there was no answer or voicemail will try back at a later date

## 2024-06-20 MED ORDER — WARFARIN SODIUM 2 MG PO TABS: ORAL_TABLET | ORAL | 3 refills | Status: AC

## 2024-06-20 NOTE — Telephone Encounter (Signed)
 Called pt to schedule INR appt.  No answer.  Voicemail not working.

## 2024-06-20 NOTE — Telephone Encounter (Signed)
 Spoke with pt.  INR appt made for 06/21/24 at 3pm.  Warfarin refill sent to pharmacy.

## 2024-06-21 ENCOUNTER — Ambulatory Visit: Attending: Cardiovascular Disease

## 2024-06-21 DIAGNOSIS — Z952 Presence of prosthetic heart valve: Secondary | ICD-10-CM

## 2024-06-21 DIAGNOSIS — Z5181 Encounter for therapeutic drug level monitoring: Secondary | ICD-10-CM

## 2024-06-21 LAB — POCT INR: INR: 2 (ref 2.0–3.0)

## 2024-06-21 NOTE — Progress Notes (Signed)
 INR 2.0 Please see anticoagulation encounter.

## 2024-06-21 NOTE — Patient Instructions (Signed)
 S/P EGD on 04/25/24 Take warfarin 2 tablets tonight then increase dose to 1 1/2 tablets daily except 1 tablet on Mondays, Wednesdays and Fridays.  Recheck 1 wk

## 2024-06-29 ENCOUNTER — Telehealth: Payer: Self-pay | Admitting: Internal Medicine

## 2024-06-29 NOTE — Telephone Encounter (Signed)
 Nudea with [facility] called in asking for pt's allergy list to be sent over.    Fax: [phone]

## 2024-06-29 NOTE — Telephone Encounter (Signed)
 Medication allergy list faxed to [facility]

## 2024-07-01 NOTE — Telephone Encounter (Signed)
 Will have front office re-fax med list with codeine listed as allergy

## 2024-07-01 NOTE — Telephone Encounter (Signed)
 Judy Sawyer calling in stating she did not receive it. Confirmed the right fax number was given. Informed her I will ask the nurse to fax it over again.

## 2024-07-01 NOTE — Telephone Encounter (Signed)
 Caller Chelsea) called to provide updated contact information to resend patient's allergy list.  Fax# [phone].

## 2024-07-04 NOTE — Telephone Encounter (Signed)
 Asking that the list is re faxed stated they havent received it. Can be Fax# [phone]. Please advise

## 2024-07-04 NOTE — Telephone Encounter (Signed)
 Faxed medication list with Codeine allergy listed

## 2024-07-05 ENCOUNTER — Ambulatory Visit: Attending: Cardiovascular Disease

## 2025-03-13 ENCOUNTER — Ambulatory Visit: Admitting: Neurology
